# Patient Record
Sex: Female | Born: 1935 | Race: Black or African American | Hispanic: No | Marital: Single | Smoking: Never smoker
Health system: Southern US, Community
[De-identification: ages and names within clinical notes are randomized; demographics above are authoritative.]

## PROBLEM LIST (undated history)

## (undated) DIAGNOSIS — E7800 Pure hypercholesterolemia, unspecified: Secondary | ICD-10-CM

## (undated) DIAGNOSIS — A048 Other specified bacterial intestinal infections: Secondary | ICD-10-CM

## (undated) DIAGNOSIS — K2970 Gastritis, unspecified, without bleeding: Secondary | ICD-10-CM

## (undated) DIAGNOSIS — F419 Anxiety disorder, unspecified: Secondary | ICD-10-CM

## (undated) DIAGNOSIS — I10 Essential (primary) hypertension: Secondary | ICD-10-CM

## (undated) DIAGNOSIS — R630 Anorexia: Principal | ICD-10-CM

## (undated) DIAGNOSIS — I219 Acute myocardial infarction, unspecified: Secondary | ICD-10-CM

## (undated) HISTORY — DX: Other specified bacterial intestinal infections: A04.8

## (undated) HISTORY — DX: Gastritis, unspecified, without bleeding: K29.70

## (undated) HISTORY — DX: Anorexia: R63.0

---

## 1998-06-24 ENCOUNTER — Emergency Department (HOSPITAL_COMMUNITY): Admission: EM | Admit: 1998-06-24 | Discharge: 1998-06-24 | Payer: Self-pay | Admitting: Emergency Medicine

## 1999-08-17 ENCOUNTER — Emergency Department (HOSPITAL_COMMUNITY): Admission: EM | Admit: 1999-08-17 | Discharge: 1999-08-17 | Payer: Self-pay | Admitting: Emergency Medicine

## 1999-11-12 ENCOUNTER — Emergency Department (HOSPITAL_COMMUNITY): Admission: EM | Admit: 1999-11-12 | Discharge: 1999-11-12 | Payer: Self-pay

## 2001-02-18 ENCOUNTER — Emergency Department (HOSPITAL_COMMUNITY): Admission: EM | Admit: 2001-02-18 | Discharge: 2001-02-18 | Payer: Self-pay | Admitting: Emergency Medicine

## 2002-04-22 ENCOUNTER — Encounter: Payer: Self-pay | Admitting: Emergency Medicine

## 2002-04-22 ENCOUNTER — Emergency Department (HOSPITAL_COMMUNITY): Admission: EM | Admit: 2002-04-22 | Discharge: 2002-04-22 | Payer: Self-pay | Admitting: Emergency Medicine

## 2003-12-30 ENCOUNTER — Emergency Department (HOSPITAL_COMMUNITY): Admission: EM | Admit: 2003-12-30 | Discharge: 2003-12-30 | Payer: Self-pay | Admitting: Emergency Medicine

## 2005-06-20 ENCOUNTER — Emergency Department (HOSPITAL_COMMUNITY): Admission: EM | Admit: 2005-06-20 | Discharge: 2005-06-20 | Payer: Self-pay | Admitting: Emergency Medicine

## 2005-07-02 ENCOUNTER — Encounter: Admission: RE | Admit: 2005-07-02 | Discharge: 2005-07-02 | Payer: Self-pay | Admitting: Internal Medicine

## 2006-08-08 ENCOUNTER — Emergency Department (HOSPITAL_COMMUNITY): Admission: EM | Admit: 2006-08-08 | Discharge: 2006-08-08 | Payer: Self-pay | Admitting: Emergency Medicine

## 2008-03-29 ENCOUNTER — Encounter: Admission: RE | Admit: 2008-03-29 | Discharge: 2008-03-29 | Payer: Self-pay | Admitting: Internal Medicine

## 2008-04-23 ENCOUNTER — Emergency Department (HOSPITAL_COMMUNITY): Admission: EM | Admit: 2008-04-23 | Discharge: 2008-04-23 | Payer: Self-pay | Admitting: Emergency Medicine

## 2008-05-09 ENCOUNTER — Emergency Department (HOSPITAL_COMMUNITY): Admission: EM | Admit: 2008-05-09 | Discharge: 2008-05-09 | Payer: Self-pay | Admitting: Emergency Medicine

## 2009-01-04 ENCOUNTER — Inpatient Hospital Stay (HOSPITAL_COMMUNITY): Admission: EM | Admit: 2009-01-04 | Discharge: 2009-01-06 | Payer: Self-pay | Admitting: Emergency Medicine

## 2009-01-06 ENCOUNTER — Encounter: Payer: Self-pay | Admitting: Interventional Cardiology

## 2009-08-24 ENCOUNTER — Encounter: Admission: RE | Admit: 2009-08-24 | Discharge: 2009-08-24 | Payer: Self-pay | Admitting: Internal Medicine

## 2009-08-30 ENCOUNTER — Emergency Department (HOSPITAL_COMMUNITY): Admission: EM | Admit: 2009-08-30 | Discharge: 2009-08-30 | Payer: Self-pay | Admitting: Emergency Medicine

## 2009-09-27 ENCOUNTER — Encounter
Admission: RE | Admit: 2009-09-27 | Discharge: 2009-12-26 | Payer: Self-pay | Source: Home / Self Care | Admitting: Sports Medicine

## 2010-06-08 ENCOUNTER — Other Ambulatory Visit: Payer: Self-pay | Admitting: Internal Medicine

## 2010-06-08 DIAGNOSIS — Z780 Asymptomatic menopausal state: Secondary | ICD-10-CM

## 2010-06-08 DIAGNOSIS — Z1231 Encounter for screening mammogram for malignant neoplasm of breast: Secondary | ICD-10-CM

## 2010-06-08 DIAGNOSIS — Z1239 Encounter for other screening for malignant neoplasm of breast: Secondary | ICD-10-CM

## 2010-08-18 LAB — COMPREHENSIVE METABOLIC PANEL
AST: 41 U/L — ABNORMAL HIGH (ref 0–37)
Albumin: 3.7 g/dL (ref 3.5–5.2)
Alkaline Phosphatase: 45 U/L (ref 39–117)
BUN: 27 mg/dL — ABNORMAL HIGH (ref 6–23)
CO2: 29 mEq/L (ref 19–32)
Calcium: 9.4 mg/dL (ref 8.4–10.5)
Chloride: 101 mEq/L (ref 96–112)
Creatinine, Ser: 1.06 mg/dL (ref 0.4–1.2)
GFR calc Af Amer: >60 mL/min (ref >60)
GFR calc non Af Amer: 50 mL/min — ABNORMAL LOW (ref >60)
GFR calc non Af Amer: >60 mL/min (ref >60)
Glucose, Bld: 111 mg/dL — ABNORMAL HIGH (ref 70–99)

## 2010-08-18 LAB — CARDIAC PANEL(CRET KIN+CKTOT+MB+TROPI)
CK, MB: 2.3 ng/mL (ref 0.3–4.0)
CK, MB: 2.7 ng/mL (ref 0.3–4.0)
Relative Index: INVALID (ref 0.0–2.5)
Relative Index: INVALID (ref 0.0–2.5)
Total CK: 90 U/L (ref 7–177)
Total CK: 95 U/L (ref 7–177)
Total CK: 99 U/L (ref 7–177)
Troponin I: 0.02 ng/mL (ref 0.00–0.06)
Troponin I: 0.03 ng/mL (ref 0.00–0.06)

## 2010-08-18 LAB — CBC
HCT: 38.9 % (ref 36.0–46.0)
Hemoglobin: 12.9 g/dL (ref 12.0–15.0)
MCHC: 33.5 g/dL (ref 30.0–36.0)
MCV: 92.1 fL (ref 78.0–100.0)
Platelets: 205 10*3/uL (ref 150–400)
RDW: 12.6 % (ref 11.5–15.5)
WBC: 5.1 10*3/uL (ref 4.0–10.5)

## 2010-08-18 LAB — LIPID PANEL
Cholesterol: 187 mg/dL (ref 0–200)
HDL: 70 mg/dL (ref >39)
LDL Cholesterol: 107 mg/dL — ABNORMAL HIGH (ref 0–99)
Triglycerides: 48 mg/dL (ref <150)
VLDL: 10 mg/dL (ref 0–40)

## 2010-08-18 LAB — DIFFERENTIAL
Basophils Relative: 1 % (ref 0–1)
Eosinophils Relative: 1 % (ref 0–5)
Monocytes Absolute: 0.7 10*3/uL (ref 0.1–1.0)
Monocytes Relative: 14 % — ABNORMAL HIGH (ref 3–12)
Neutrophils Relative %: 52 % (ref 43–77)

## 2010-08-18 LAB — APTT: aPTT: 24 seconds (ref 24–37)

## 2010-08-18 LAB — BASIC METABOLIC PANEL
BUN: 19 mg/dL (ref 6–23)
Calcium: 8.8 mg/dL (ref 8.4–10.5)
Potassium: 3.7 mEq/L (ref 3.5–5.1)
Sodium: 138 mEq/L (ref 135–145)

## 2010-08-18 LAB — POCT CARDIAC MARKERS: Myoglobin, poc: 56.4 ng/mL (ref 12–200)

## 2010-08-27 ENCOUNTER — Other Ambulatory Visit: Payer: Self-pay

## 2010-08-27 ENCOUNTER — Ambulatory Visit: Payer: Self-pay

## 2010-09-21 ENCOUNTER — Emergency Department (HOSPITAL_COMMUNITY)
Admission: EM | Admit: 2010-09-21 | Discharge: 2010-09-22 | Discharge status: Against Medical Advice | Payer: Medicare Other | Attending: Emergency Medicine | Admitting: Emergency Medicine

## 2010-09-21 ENCOUNTER — Emergency Department (HOSPITAL_COMMUNITY): Payer: Medicare Other

## 2010-09-21 DIAGNOSIS — E785 Hyperlipidemia, unspecified: Secondary | ICD-10-CM | POA: Insufficient documentation

## 2010-09-21 DIAGNOSIS — R110 Nausea: Secondary | ICD-10-CM | POA: Insufficient documentation

## 2010-09-21 DIAGNOSIS — I2510 Atherosclerotic heart disease of native coronary artery without angina pectoris: Secondary | ICD-10-CM | POA: Insufficient documentation

## 2010-09-21 DIAGNOSIS — R079 Chest pain, unspecified: Secondary | ICD-10-CM | POA: Insufficient documentation

## 2010-09-21 DIAGNOSIS — I252 Old myocardial infarction: Secondary | ICD-10-CM | POA: Insufficient documentation

## 2010-09-21 DIAGNOSIS — E876 Hypokalemia: Secondary | ICD-10-CM | POA: Insufficient documentation

## 2010-09-21 DIAGNOSIS — I10 Essential (primary) hypertension: Secondary | ICD-10-CM | POA: Insufficient documentation

## 2010-09-22 ENCOUNTER — Emergency Department (HOSPITAL_COMMUNITY): Payer: Medicare Other

## 2010-09-22 LAB — COMPREHENSIVE METABOLIC PANEL
ALT: 18 U/L (ref 0–35)
AST: 27 U/L (ref 0–37)
CO2: 28 mEq/L (ref 19–32)
Chloride: 86 mEq/L — ABNORMAL LOW (ref 96–112)
Creatinine, Ser: 0.59 mg/dL (ref 0.4–1.2)
GFR calc Af Amer: >60 mL/min (ref >60)
GFR calc non Af Amer: >60 mL/min (ref >60)
Glucose, Bld: 140 mg/dL — ABNORMAL HIGH (ref 70–99)
Total Bilirubin: 0.5 mg/dL (ref 0.3–1.2)

## 2010-09-22 LAB — URINALYSIS, ROUTINE W REFLEX MICROSCOPIC
Nitrite: NEGATIVE
Protein, ur: NEGATIVE mg/dL
Specific Gravity, Urine: 1.009 (ref 1.005–1.030)
Urobilinogen, UA: 0.2 mg/dL (ref 0.0–1.0)

## 2010-09-22 LAB — POCT CARDIAC MARKERS

## 2010-09-22 LAB — URINE MICROSCOPIC-ADD ON

## 2010-09-22 LAB — POCT I-STAT, CHEM 8
Hemoglobin: 13.9 g/dL (ref 12.0–15.0)
Potassium: 2.9 mEq/L — ABNORMAL LOW (ref 3.5–5.1)
Sodium: 125 mEq/L — ABNORMAL LOW (ref 135–145)
TCO2: 27 mmol/L (ref 0–100)

## 2010-09-22 LAB — DIFFERENTIAL
Basophils Relative: 0 % (ref 0–1)
Eosinophils Absolute: 0 10*3/uL (ref 0.0–0.7)
Eosinophils Relative: 0 % (ref 0–5)
Monocytes Absolute: 0.3 10*3/uL (ref 0.1–1.0)
Monocytes Relative: 9 % (ref 3–12)
Neutro Abs: 1.9 10*3/uL (ref 1.7–7.7)

## 2010-09-22 LAB — PROTIME-INR: INR: 0.88 (ref 0.00–1.49)

## 2010-09-22 LAB — CBC
Hemoglobin: 12.1 g/dL (ref 12.0–15.0)
MCH: 29.4 pg (ref 26.0–34.0)
MCHC: 33.8 g/dL (ref 30.0–36.0)
RDW: 12.1 % (ref 11.5–15.5)

## 2010-09-25 NOTE — Discharge Summary (Signed)
NAME:  Karen Arias, Karen Arias            ACCOUNT NO.:  [id number]   MEDICAL RECORD NO.:  [id number]          PATIENT TYPE:  INP   LOCATION:  1420                         FACILITY:  [facility]   PHYSICIAN:  Herbie Saxon, MDDATE OF BIRTH:  [birth year]   DATE OF ADMISSION:  01/04/2009  DATE OF DISCHARGE:  01/06/2009                               DISCHARGE SUMMARY   DISCHARGE DIAGNOSES:  1. Chest pain, acute coronary syndrome ruled out.  2. Hypokalemia, supplemented.  3. Mild acute renal insufficiency, improved.  4. Hyperlipidemia.  5. Hypertension, stable.  6. Mild dyslipidemia.   CONSULTS:  Lyn Records, M.D., Cardiology.   DIAGNOSTICS:  1. The patient had nuclear stress test which showed ejection fraction      of 33%, anteroapical scar, no significant ischemia.  2. Chest x-ray of January 04, 2009 showed stable cardiomegaly and      lingular atelectasis.  No acute chest findings.   HOSPITAL COURSE:  This 77 year old African American lady presented to  the emergency room with complaint of substernal chest discomfort  radiating to both shoulders.  At presentation, the patient was slightly  hyperkalemic and dehydrated.  She was started on gentle IV fluid  hydration and potassium was supplemented.  Serial cardiac enzymes were  followed and this has been normal.  The patient also had mild  hyponatremia which improved with the IV fluid hydration.  The patient  has been ruled out with a negative nuclear stress test today.  She has  been cleared for discharge home by the Cardiologist.   CONDITION ON DISCHARGE:  Stable.   DIET:  Diet will be a 2 gm sodium, heart healthy, low cholesterol.   ACTIVITY:  Increase slowly as tolerated.   FOLLOW UP:  1. She will follow up with primary care physician, Dr. Su Hilt in the      next 5-7 days.  2. Follow up with Dr. Katrinka Blazing, Cardiology, in the next 7-10 days.   DISCHARGE MEDICATIONS:  1. Nitroglycerin 0.4 mg sublingual p.r.n.  2. Enteric-coated  aspirin 325 mg daily.  3. Coreg 3.125 mg twice daily.  4. Lipitor 80 mg daily.  5. Xanax 0.5 mg t.i.d. as needed.  6. Maxzide 25/37.5 one tablet daily.   PHYSICAL EXAMINATION:  GENERAL:  Today, she is an elderly lady not in  acute distress.  VITAL SIGNS:  Stable.  Temperature 98, pulse 70, respiratory rate 18,  blood pressure 150/80.  HEENT:  Pupils are equal and reactive to light and accommodation.  She  is not pale or jaundiced.  Head is atraumatic, normocephalic.  Mucous  membranes are moist.  Oropharynx and nasopharynx are clear.  NECK:  Supple.  No elevated JVD.  No thyromegaly or lymphadenopathy.  No  carotid bruit.  CHEST:  Clinically clear.  HEART:  Sounds 1 and 2, regular rate and rhythm.  ABDOMEN:  Benign.  CNS:  No focal deficits.  EXTREMITIES:  Peripheral pulses are present.  No pedal edema.  No  cyanosis or clubbing.  SKIN:  No new skin rash.   LABORATORY DATA:  Thyroid function test is normal.  Chemistry:  Sodium  is 138, potassium 3.7, chloride 103, bicarbonate 30, glucose 112, BUN 9,  creatinine 0.8, WBC 5, hematocrit 39, platelet count 189.   Discharge time greater than 30 minutes.      Herbie Saxon, MD  Electronically Signed     MIO/MEDQ  D:  01/06/2009  T:  01/06/2009  Job:  [id number]

## 2010-09-25 NOTE — H&P (Signed)
NAME:  Karen Arias, Karen Arias NO.:  [id number]   MEDICAL RECORD NO.:  [id number]          PATIENT TYPE:  EMS   LOCATION:  ED                           FACILITY:  [facility]   PHYSICIAN:  Massie Maroon, MD        DATE OF BIRTH:  [birth year]   DATE OF ADMISSION:  01/04/2009  DATE OF DISCHARGE:                              HISTORY & PHYSICAL   PRIMARY CARE PHYSICIAN:  Antony Madura, M.D.   CHIEF COMPLAINT:  Chest pain.   HISTORY OF PRESENT ILLNESS:  A 77 year old female with a history of CAD,  status post MI in 1981, hypertension, hyperlipidemia, complains of chest  pain that began about 2 p.m. this afternoon.  She describes the pain as  achy and substernal.  It radiated to her bilateral shoulders.  It was  constant and she took some Tylenol and then it went away.  The patient  denies any fever, chills, cough, palpitations, nausea, vomiting, acid  reflux, heartburn, abdominal pain, diarrhea, bright red blood per  rectum, or black stool.  Emergency room records state that chest  discomfort started several days ago but today started 2 p.m.Marland Kitchen  She could  not describe anything that made it better or worse.  She was concerned  about her heart so she came to the ED for evaluation.  Her chest x-ray  was read as negative for any acute process.  Her  EKG showed normal  sinus rhythm at 67, normal PR interval, prolonged QTC of 460, borderline  left axis deviation, right bundle branch block, Q-wave in V1-V2, T-wave  inversion and I, aVL, V3, V5, V6.  No ST-T segment changes consistent  with acute ischemia.  There was no prior EKG for comparison.  The  patient was noted to be slightly hypokalemic and dehydrated in the ED.  Her potassium was 3.4, BUN was 32.  The patient received O2 nasal  cannula in the ED.  The patient will be admitted for workup of chest  pain.   PAST MEDICAL HISTORY:  1. Hypertension.  2. Hyperlipidemia.  3. Coronary artery disease, status post MI in 1981.  4. Blood  clot in the legs in the remote past.   PAST SURGICAL HISTORY:  1. Cataract surgery.  2. Tubal ligation.  3. C-section  4. Appendectomy.  5. Colonoscopy 3 years ago, negative per the patient.   SOCIAL HISTORY:  The patient lives at home and is single.  She has four  children.  She has never smoked and does not drink.  There is no history  of drug abuse as far as we know.   FAMILY HISTORY:  Mother died at age 35 from liver problems and alcohol  abuse.  Her father died at age 41 from heart attack.  He was a diabetic  and had some form of sinus cancer.  She has no siblings.  Her  grandmother also had heart problems.   REVIEW OF SYSTEMS:  Negative for all 10 organ systems except for  pertinent positives stated above.   ALLERGIES:  NO KNOWN DRUG ALLERGIES.   MEDICATIONS:  Aspirin,  Lipitor, Maxzide, Xanax as per ED record.   PHYSICAL EXAMINATION:  VITAL SIGNS:  Temperature 97.9, pulse 71,  respirations 18, blood pressure 143/77, pulse ox 90% on room air.  HEENT: Anicteric, EOMI, no nystagmus, pupils 1.5 mm, symmetric.  Mucous  membranes moist.  NECK:  No JVD, no bruit, no thyromegaly, no adenopathy.  HEART:  Regular rate and rhythm.  S1 and S2, no murmurs, gallops or  rubs.  LUNGS:  Clear to auscultation bilaterally.  ABDOMEN:  Abdomen is soft, obese, nontender, nondistended.  Positive  bowel sounds.  EXTREMITIES:  No cyanosis, clubbing or edema.  DP pulses 2+ bilaterally.  SKIN:  No rashes.  LYMPH NODES:  No adenopathy.  NEUROLOGIC:  Nonfocal.   LABORATORY DATA:  Sodium 33 (low),  potassium 3.4 (low), chloride 98,  bicarb 29, BUN 32 (high), creatinine 1.06, AST 41 (high), , ALT 35, alk  phos 45, total bilirubin 0.8, PTT 24, CPK-MB 1.3, troponin-I less than  0.05.  WBC 5.3, hemoglobin 12.9, platelet count 205.  EKG:  See reading  in above HPI.  Chest x-ray negative for any acute process.   ASSESSMENT/PLAN:  1. Chest pain:  The patient placed on telemetry.  We will check       troponin-I q.8 h x3 sets.  The patient will be continued on      aspirin, Lipitor.  We will start on carvedilol 3.125 mg p.o. b.i.d.      Chest pain sounds somewhat atypical but with her history of      coronary disease, we will obtain a nuclear stress test after she is      ruled out.  2. Hypokalemia:  We will proceeded with potassium chloride 20 mEq p.o.      times one.  3. Hyponatremia:  Very mild. I do not believe that this needs further      workup at this time.  We will hydrate her gently with normal saline      due to the fact that she appears dehydrated with a BUN and      creatinine ratio of greater than 20.  4. Abnormal liver function tests:  I believe that this can be followed      up as an outpatient by her primary care doctor.  We will order      right upper quadrant ultrasound, however, and we will check acute      hepatitis panel, but further workup such as iron studies, serulo      plasma, alpha one antitrypsin, ANA, anti-smooth muscle antibodies,      and celiac panel can be performed as an outpatient.  Most likely      her abnormal liver function test is due to be a fatty liver or the      fact that she is on a statin medication.  5. Deep venous thrombosis prophylaxis:  Lovenox 40 mg subcu daily.      Massie Maroon, MD  Electronically Signed     JYK/MEDQ  D:  01/04/2009  T:  01/05/2009  Job:  [id number]   cc:   Antony Madura, M.D.  Fax: [phone]

## 2010-09-25 NOTE — Consult Note (Signed)
NAME:  Karen Arias, Karen Arias            ACCOUNT NO.:  [id number]   MEDICAL RECORD NO.:  [id number]          PATIENT TYPE:  INP   LOCATION:  1420                         FACILITY:  [facility]   PHYSICIAN:  Lyn Records, M.D.   DATE OF BIRTH:  Mar 23, 1929   DATE OF CONSULTATION:  DATE OF DISCHARGE:                                 CONSULTATION   CONCLUSIONS:  1. Chest and arm discomfort of uncertain cause lasting less than 1      hour.  Negative cardiac markers to this point.  2. Abnormal EKG with bifascicular block (left anterior fascicular      block and right bundle branch block).  3. History of heart attack in 1981.      a.     Temporary pacemaker for 48 hours during a 27-day       hospitalization.  4. Hypertension.  5. Hyperlipidemia.  6. Hypokalemia.   RECOMMENDATIONS:  1. Stress nuclear study on January 06, 2009, at 8 a.m.  2. NPO after midnight.  3. Ambulate today.  4. Serial ECGs and markers.   COMMENTS:  The patient is an 77 year old patient followed by Dr. Burton Apley.  She gives a history of a 1 month hospitalization in 1981  related to a heart attack.  She had temporary pacemaker inserted for  about 48 hours.  Subsequently, she has done well.  She did not have  angioplasty or any type of revascularization procedure at that time.  [facility] [employer]  She denies orthopnea  and PND.  There are no physical limitations.   On the day of admission, she was getting ready to go to church in the  evening and noticed an achy feeling throughout her chest, in her  abdomen, and into her arms.  It persisted and it caused her to come to  the emergency room.  She initially took some Tylenol for the discomfort,  but it did not relieve the discomfort after about 20 minutes.  By the  time she arrived in the emergency room, she felt well.  She was admitted  to the hospital to rule out myocardial infarction.   SIGNIFICANT MEDICAL PROBLEMS:  1.  Hypertension.  2. Hyperlipidemia.  3. History of DVT  4. History of appendectomy.   ALLERGIES:  No known drug allergies.   MEDICATIONS ON ADMISSION:  Lipitor unknown dose, Maxzide unknown dose,  Xanax p.r.n., coated aspirin 1 per day.   FAMILY HISTORY:  Mother died at age 39 of liver failure due to alcohol  abuse.  Father died at age 64 of heart trouble and diabetes and cancer.  No siblings.   PAST SURGICAL HISTORY:  1. C-section  2. Tubal ligation.  3. Cataract surgery.  4. Appendectomy.   PHYSICAL EXAMINATION:  GENERAL:  On exam, the patient is in no acute  distress.  VITAL SIGNS:  Her blood pressure is 124/60, the heart rate is 55.  O2  saturation is 98%.  NECK:  The neck veins are not distended.  There no carotid bruits.  LUNGS:  Clear.  CARDIAC:  Exam  reveals no gallop.  ABDOMEN:  Soft.  No bruits are heard.  EXTREMITIES:  Reveal no edema.  Pedal pulses were not assessed.  NEUROLOGICAL:  Exam is unremarkable.   EKG is abnormal demonstrating a right bundle, biatrial abnormality, left  anterior hemiblock.  No prior tracing available for comparison.  Hemoglobin is 12.9 on admission, white blood cell count 5,300, BUN 27,  creatinine 0.85, alk phos is low at 35, SGOT 40, and SGPT is 38 both  minimally elevated.  Cholesterol LDL is 107.   DISCUSSION:  We do not have details of the patient's prior history of  myocardial infarction.  She states that there was a massive heart  attack.  However, her clinical status over the past 29 years has been  remarkably stable making it somewhat doubtful that this was a  massive/large MI.  Given that history, however, at the very least the  patient will need to have a stress nuclear study performed.  Unfortunately, there are no doses left today (8 a.m. on January 05, 2009)  and the study will need to be performed tomorrow morning.      Lyn Records, M.D.  Electronically Signed     HWS/MEDQ  D:  01/05/2009  T:  01/05/2009  Job:   [id number]   cc:   Antony Madura, M.D.  Fax: [phone]

## 2011-12-17 ENCOUNTER — Emergency Department (HOSPITAL_COMMUNITY)
Admission: EM | Admit: 2011-12-17 | Discharge: 2011-12-17 | Disposition: A | Discharge status: Home / Self Care | Payer: Medicare Other | Attending: Emergency Medicine | Admitting: Emergency Medicine

## 2011-12-17 ENCOUNTER — Emergency Department (HOSPITAL_COMMUNITY): Payer: Medicare Other

## 2011-12-17 ENCOUNTER — Encounter (HOSPITAL_COMMUNITY): Payer: Self-pay | Admitting: *Deleted

## 2011-12-17 DIAGNOSIS — F411 Generalized anxiety disorder: Secondary | ICD-10-CM | POA: Insufficient documentation

## 2011-12-17 DIAGNOSIS — E119 Type 2 diabetes mellitus without complications: Secondary | ICD-10-CM | POA: Insufficient documentation

## 2011-12-17 DIAGNOSIS — I10 Essential (primary) hypertension: Secondary | ICD-10-CM | POA: Insufficient documentation

## 2011-12-17 DIAGNOSIS — E7800 Pure hypercholesterolemia, unspecified: Secondary | ICD-10-CM | POA: Insufficient documentation

## 2011-12-17 DIAGNOSIS — R110 Nausea: Secondary | ICD-10-CM | POA: Insufficient documentation

## 2011-12-17 DIAGNOSIS — R109 Unspecified abdominal pain: Secondary | ICD-10-CM | POA: Insufficient documentation

## 2011-12-17 DIAGNOSIS — Z79899 Other long term (current) drug therapy: Secondary | ICD-10-CM | POA: Insufficient documentation

## 2011-12-17 HISTORY — DX: Anxiety disorder, unspecified: F41.9

## 2011-12-17 HISTORY — DX: Essential (primary) hypertension: I10

## 2011-12-17 HISTORY — DX: Pure hypercholesterolemia, unspecified: E78.00

## 2011-12-17 LAB — URINE MICROSCOPIC-ADD ON

## 2011-12-17 LAB — COMPREHENSIVE METABOLIC PANEL
ALT: 16 U/L (ref 0–35)
Alkaline Phosphatase: 45 U/L (ref 39–117)
CO2: 33 mEq/L — ABNORMAL HIGH (ref 19–32)
GFR calc Af Amer: 88 mL/min — ABNORMAL LOW (ref >90)
Glucose, Bld: 114 mg/dL — ABNORMAL HIGH (ref 70–99)
Potassium: 3.3 mEq/L — ABNORMAL LOW (ref 3.5–5.1)
Sodium: 129 mEq/L — ABNORMAL LOW (ref 135–145)
Total Protein: 7 g/dL (ref 6.0–8.3)

## 2011-12-17 LAB — URINALYSIS, ROUTINE W REFLEX MICROSCOPIC
Bilirubin Urine: NEGATIVE
Hgb urine dipstick: NEGATIVE
Specific Gravity, Urine: 1.014 (ref 1.005–1.030)
pH: 7.5 (ref 5.0–8.0)

## 2011-12-17 LAB — CBC WITH DIFFERENTIAL/PLATELET
Eosinophils Absolute: 0 10*3/uL (ref 0.0–0.7)
Lymphocytes Relative: 41 % (ref 12–46)
Lymphs Abs: 1.5 10*3/uL (ref 0.7–4.0)
Neutrophils Relative %: 46 % (ref 43–77)
Platelets: 215 10*3/uL (ref 150–400)
RBC: 4.09 MIL/uL (ref 3.87–5.11)
WBC: 3.7 10*3/uL — ABNORMAL LOW (ref 4.0–10.5)

## 2011-12-17 MED ORDER — ONDANSETRON HCL 4 MG PO TABS: 4.0000 mg | ORAL_TABLET | Freq: Four times a day (QID) | ORAL | Status: AC

## 2011-12-17 MED ORDER — DICYCLOMINE HCL 20 MG PO TABS: 20.0000 mg | ORAL_TABLET | Freq: Two times a day (BID) | ORAL | Status: DC

## 2011-12-17 NOTE — ED Notes (Signed)
Pt states "I been having abdominal pain for a couple of weeks, feels kind of like indigestion, the smell of food makes me sick, my legs have also been hurting, I go to the gym everyday"

## 2011-12-17 NOTE — ED Provider Notes (Addendum)
History     CSN: [id number]  Arrival date & time 12/17/11  1052   First MD Initiated Contact with Patient 12/17/11 1156      Chief Complaint  Patient presents with  . Abdominal Pain  . Nausea    (Consider location/radiation/quality/duration/timing/severity/associated sxs/prior treatment) HPI Pt has had upper abd fullness and anorexia for several weeks. No fever chills, V/D/C, urinary symptoms.  Fullness is not associated with food. She has tried taking tums with little effect. No pain currently.  Past Medical History  Diagnosis Date  . Hypertension   . Diabetes mellitus   . Hypercholesteremia   . Anxiety     Past Surgical History  Procedure Date  . Cesarean section     No family history on file.  History  Substance Use Topics  . Smoking status: Never Smoker   . Smokeless tobacco: Not on file  . Alcohol Use: No    OB History    Grav Para Term Preterm Abortions TAB SAB Ect Mult Living                  Review of Systems  Constitutional: Positive for appetite change. Negative for fever and chills.  HENT: Negative for neck pain and neck stiffness.   Respiratory: Negative for shortness of breath.   Cardiovascular: Negative for chest pain and leg swelling.  Gastrointestinal: Positive for nausea, abdominal pain and abdominal distention. Negative for vomiting and diarrhea.  Genitourinary: Negative for dysuria and frequency.  Musculoskeletal: Positive for myalgias. Negative for back pain.  Skin: Negative for pallor, rash and wound.  Neurological: Negative for dizziness, weakness, light-headedness and numbness.    Allergies  Review of patient's allergies indicates no known allergies.  Home Medications   Current Outpatient Rx  Name Route Sig Dispense Refill  . ALPRAZOLAM 0.5 MG PO TABS Oral Take 0.5 mg by mouth daily as needed. anxiety    . ATORVASTATIN CALCIUM 80 MG PO TABS Oral Take 80 mg by mouth daily.    . OS-CAL 500 + D PO Oral Take 1 tablet by mouth daily.     Marland Kitchen METFORMIN HCL 500 MG PO TABS Oral Take 500 mg by mouth daily with breakfast.    . TRIAMTERENE-HCTZ 75-50 MG PO TABS Oral Take 0.5 tablets by mouth daily.    Marland Kitchen DICYCLOMINE HCL 20 MG PO TABS Oral Take 1 tablet (20 mg total) by mouth 2 (two) times daily. 20 tablet 0  . ONDANSETRON HCL 4 MG PO TABS Oral Take 1 tablet (4 mg total) by mouth every 6 (six) hours. 12 tablet 0    BP 139/64  Pulse 98  Temp 98.5 F (36.9 C)  Resp 14  SpO2 99%  Physical Exam  Nursing note and vitals reviewed. Constitutional: She is oriented to person, place, and time. She appears well-developed and well-nourished. No distress.  HENT:  Head: Normocephalic and atraumatic.  Mouth/Throat: Oropharynx is clear and moist.  Eyes: EOM are normal. Pupils are equal, round, and reactive to light.  Neck: Normal range of motion. Neck supple.  Cardiovascular: Normal rate and regular rhythm.   Pulmonary/Chest: Effort normal and breath sounds normal. No respiratory distress. She has no wheezes. She has no rales.  Abdominal: Soft. Bowel sounds are normal. She exhibits no distension and no mass. There is no tenderness. There is no rebound and no guarding.  Musculoskeletal: Normal range of motion. She exhibits no edema and no tenderness.       Mild lower ext muscular tenderness,  Neurovasc intact.   Neurological: She is alert and oriented to person, place, and time.       5/5 motor, sensation intact  Skin: Skin is warm and dry. No rash noted. No erythema.  Psychiatric: She has a normal mood and affect. Her behavior is normal.    ED Course  Procedures (including critical care time)  Labs Reviewed  URINALYSIS, ROUTINE W REFLEX MICROSCOPIC - Abnormal; Notable for the following:    Leukocytes, UA SMALL (*)     All other components within normal limits  URINE MICROSCOPIC-ADD ON - Abnormal; Notable for the following:    Squamous Epithelial / LPF FEW (*)     Bacteria, UA FEW (*)     All other components within normal limits    CBC WITH DIFFERENTIAL - Abnormal; Notable for the following:    WBC 3.7 (*)     Monocytes Relative 13 (*)     All other components within normal limits  COMPREHENSIVE METABOLIC PANEL - Abnormal; Notable for the following:    Sodium 129 (*)     Potassium 3.3 (*)     Chloride 90 (*)     CO2 33 (*)     Glucose, Bld 114 (*)     GFR calc non Af Amer 76 (*)     GFR calc Af Amer 88 (*)     All other components within normal limits  LIPASE, BLOOD   Dg Abd Acute W/chest  12/17/2011  *RADIOLOGY REPORT*  Clinical Data: Upper abdominal pain.  ACUTE ABDOMEN SERIES (ABDOMEN 2 VIEW & CHEST 1 VIEW)  Comparison: Chest x-ray dated 09/22/2010  Findings: There is chronic cardiomegaly.  Lungs are clear of acute infiltrates.  The bowel gas pattern is normal.  No free air or free fluid.  No significant osseous abnormality.  IMPRESSION: Benign-appearing abdomen.  Chronic cardiomegaly.  Original Report Authenticated By: Gwynn Burly, M.D.     1. Abdominal pain of unknown etiology      Date: 02/19/2012  Rate: 54  Rhythm: sinus bradycardia  QRS Axis: normal  Intervals: QRS prolonged  ST/T Wave abnormalities: nonspecific T wave changes  Conduction Disutrbances:right bundle branch block  Narrative Interpretation:   Old EKG Reviewed: none available    MDM  Pt resting comfortably. Abd remains soft and non-tender. Pt advised to eat more potassium-rich foods. Return for worsening pain, fever, chills, or any concerns        Loren Racer, MD 12/17/11 1546  Loren Racer, MD 02/19/12 [phone]

## 2012-01-31 ENCOUNTER — Other Ambulatory Visit: Payer: Self-pay | Admitting: Internal Medicine

## 2012-01-31 DIAGNOSIS — Z1231 Encounter for screening mammogram for malignant neoplasm of breast: Secondary | ICD-10-CM

## 2012-02-04 ENCOUNTER — Ambulatory Visit
Admission: RE | Admit: 2012-02-04 | Discharge: 2012-02-04 | Disposition: A | Discharge status: Home / Self Care | Payer: Medicare Other | Source: Ambulatory Visit | Attending: Internal Medicine | Admitting: Internal Medicine

## 2012-02-04 DIAGNOSIS — Z1231 Encounter for screening mammogram for malignant neoplasm of breast: Secondary | ICD-10-CM

## 2012-08-20 ENCOUNTER — Emergency Department (HOSPITAL_COMMUNITY): Payer: Medicare PPO

## 2012-08-20 ENCOUNTER — Encounter (HOSPITAL_COMMUNITY): Payer: Self-pay

## 2012-08-20 ENCOUNTER — Emergency Department (HOSPITAL_COMMUNITY)
Admission: EM | Admit: 2012-08-20 | Discharge: 2012-08-20 | Disposition: A | Discharge status: Home / Self Care | Payer: Medicare PPO | Attending: Emergency Medicine | Admitting: Emergency Medicine

## 2012-08-20 ENCOUNTER — Encounter: Payer: Self-pay | Admitting: Internal Medicine

## 2012-08-20 DIAGNOSIS — I252 Old myocardial infarction: Secondary | ICD-10-CM | POA: Insufficient documentation

## 2012-08-20 DIAGNOSIS — Z79899 Other long term (current) drug therapy: Secondary | ICD-10-CM | POA: Insufficient documentation

## 2012-08-20 DIAGNOSIS — K3189 Other diseases of stomach and duodenum: Secondary | ICD-10-CM

## 2012-08-20 DIAGNOSIS — R1013 Epigastric pain: Secondary | ICD-10-CM | POA: Insufficient documentation

## 2012-08-20 DIAGNOSIS — F411 Generalized anxiety disorder: Secondary | ICD-10-CM | POA: Insufficient documentation

## 2012-08-20 DIAGNOSIS — R110 Nausea: Secondary | ICD-10-CM | POA: Insufficient documentation

## 2012-08-20 DIAGNOSIS — Z9889 Other specified postprocedural states: Secondary | ICD-10-CM | POA: Insufficient documentation

## 2012-08-20 DIAGNOSIS — E119 Type 2 diabetes mellitus without complications: Secondary | ICD-10-CM | POA: Insufficient documentation

## 2012-08-20 DIAGNOSIS — R197 Diarrhea, unspecified: Secondary | ICD-10-CM | POA: Insufficient documentation

## 2012-08-20 DIAGNOSIS — I10 Essential (primary) hypertension: Secondary | ICD-10-CM | POA: Insufficient documentation

## 2012-08-20 DIAGNOSIS — E7800 Pure hypercholesterolemia, unspecified: Secondary | ICD-10-CM | POA: Insufficient documentation

## 2012-08-20 DIAGNOSIS — R634 Abnormal weight loss: Secondary | ICD-10-CM | POA: Insufficient documentation

## 2012-08-20 DIAGNOSIS — R933 Abnormal findings on diagnostic imaging of other parts of digestive tract: Secondary | ICD-10-CM | POA: Insufficient documentation

## 2012-08-20 DIAGNOSIS — R630 Anorexia: Secondary | ICD-10-CM | POA: Insufficient documentation

## 2012-08-20 HISTORY — DX: Acute myocardial infarction, unspecified: I21.9

## 2012-08-20 LAB — COMPREHENSIVE METABOLIC PANEL
ALT: 18 U/L (ref 0–35)
Albumin: 3.7 g/dL (ref 3.5–5.2)
Alkaline Phosphatase: 45 U/L (ref 39–117)
BUN: 15 mg/dL (ref 6–23)
Chloride: 89 mEq/L — ABNORMAL LOW (ref 96–112)
Glucose, Bld: 110 mg/dL — ABNORMAL HIGH (ref 70–99)
Potassium: 4 mEq/L (ref 3.5–5.1)
Sodium: 127 mEq/L — ABNORMAL LOW (ref 135–145)
Total Bilirubin: 0.3 mg/dL (ref 0.3–1.2)
Total Protein: 7 g/dL (ref 6.0–8.3)

## 2012-08-20 LAB — URINALYSIS, ROUTINE W REFLEX MICROSCOPIC
Bilirubin Urine: NEGATIVE
Glucose, UA: NEGATIVE mg/dL
Hgb urine dipstick: NEGATIVE
Ketones, ur: NEGATIVE mg/dL
Nitrite: NEGATIVE
Specific Gravity, Urine: 1.012 (ref 1.005–1.030)
pH: 7.5 (ref 5.0–8.0)

## 2012-08-20 LAB — CBC WITH DIFFERENTIAL/PLATELET
Basophils Relative: 0 % (ref 0–1)
Hemoglobin: 11.8 g/dL — ABNORMAL LOW (ref 12.0–15.0)
Lymphs Abs: 1.3 10*3/uL (ref 0.7–4.0)
Monocytes Relative: 13 % — ABNORMAL HIGH (ref 3–12)
Neutro Abs: 2.4 10*3/uL (ref 1.7–7.7)
Neutrophils Relative %: 56 % (ref 43–77)
Platelets: 242 10*3/uL (ref 150–400)
RBC: 4 MIL/uL (ref 3.87–5.11)

## 2012-08-20 LAB — URINE MICROSCOPIC-ADD ON

## 2012-08-20 LAB — LIPASE, BLOOD: Lipase: 35 U/L (ref 11–59)

## 2012-08-20 MED ORDER — IOHEXOL 300 MG/ML  SOLN
100.0000 mL | Freq: Once | INTRAMUSCULAR | Status: AC | PRN
  Administered 2012-08-20: 100 mL via INTRAVENOUS

## 2012-08-20 MED ORDER — IOHEXOL 300 MG/ML  SOLN
50.0000 mL | Freq: Once | INTRAMUSCULAR | Status: AC | PRN
  Administered 2012-08-20: 50 mL via ORAL

## 2012-08-20 NOTE — ED Provider Notes (Addendum)
History     CSN: [id number]  Arrival date & time 08/20/12  0903   First MD Initiated Contact with Patient 08/20/12 [id number]      Chief Complaint  Patient presents with  . Abdominal Pain    (Consider location/radiation/quality/duration/timing/severity/associated sxs/prior treatment) Patient is a 79 y.o. female presenting with abdominal pain. The history is provided by the patient.  Abdominal Pain Pain location:  Epigastric Pain quality: bloating, fullness and pressure   Pain radiation: Occasionally the pain radiates out from the epigastrium into the right upper and left upper quadrant. Pain severity:  Mild Onset quality:  Gradual Duration: 7 months. Timing:  Intermittent Progression:  Waxing and waning (But has worsened over the last one week) Chronicity:  Chronic Context: awakening from sleep, diet changes and eating   Context: not alcohol use   Relieved by:  Nothing (Mild improvement with GERD medication) Worsened by:  Eating Associated symptoms: anorexia, diarrhea and nausea   Associated symptoms: no chest pain, no chills, no constipation, no cough, no dysuria, no fever, no shortness of breath and no vomiting   Associated symptoms comment:  Early satiety Risk factors comment:  Has seen her PCP this month and given GERD meds.  Seen in the ER last year with normal KUB    Past Medical History  Diagnosis Date  . Hypertension   . Diabetes mellitus   . Hypercholesteremia   . Anxiety   . MI (myocardial infarction)     Past Surgical History  Procedure Laterality Date  . Cesarean section      Family History  Problem Relation Age of Onset  . Diabetes Father   . Hypertension Father   . Heart failure Father     History  Substance Use Topics  . Smoking status: Never Smoker   . Smokeless tobacco: Never Used  . Alcohol Use: No    OB History   Grav Para Term Preterm Abortions TAB SAB Ect Mult Living                  Review of Systems  Constitutional: Positive for  appetite change and unexpected weight change. Negative for fever and chills.       Lost 5-10lbs over the last few months  Respiratory: Negative for cough and shortness of breath.   Cardiovascular: Negative for chest pain.  Gastrointestinal: Positive for nausea, abdominal pain, diarrhea and anorexia. Negative for vomiting and constipation.  Genitourinary: Negative for dysuria.  All other systems reviewed and are negative.    Allergies  Review of patient's allergies indicates no known allergies.  Home Medications   Current Outpatient Rx  Name  Route  Sig  Dispense  Refill  . ALPRAZolam (XANAX) 0.5 MG tablet   Oral   Take 0.5 mg by mouth daily as needed. anxiety         . atorvastatin (LIPITOR) 80 MG tablet   Oral   Take 80 mg by mouth daily.         . Calcium Carbonate-Vitamin D (OS-CAL 500 + D PO)   Oral   Take 1 tablet by mouth daily.         . metFORMIN (GLUCOPHAGE) 500 MG tablet   Oral   Take 500 mg by mouth daily with breakfast.         . triamterene-hydrochlorothiazide (MAXZIDE) 75-50 MG per tablet   Oral   Take 0.5 tablets by mouth daily.           BP 144/69  Pulse 69  Temp(Src) 97.6 F (36.4 C) (Oral)  Resp 16  SpO2 99%  Physical Exam  Nursing note and vitals reviewed. Constitutional: She is oriented to person, place, and time. She appears well-developed and well-nourished. No distress.  HENT:  Head: Normocephalic and atraumatic.  Mouth/Throat: Oropharynx is clear and moist.  Eyes: Conjunctivae and EOM are normal. Pupils are equal, round, and reactive to light.  Neck: Normal range of motion. Neck supple.  Cardiovascular: Normal rate, regular rhythm and intact distal pulses.   No murmur heard. Pulmonary/Chest: Effort normal and breath sounds normal. No respiratory distress. She has no wheezes. She has no rales.  Abdominal: Soft. She exhibits no distension. There is no tenderness. There is no rebound and no guarding.  Musculoskeletal: Normal  range of motion. She exhibits no edema and no tenderness.  Neurological: She is alert and oriented to person, place, and time.  Skin: Skin is warm and dry. No rash noted. No erythema.  Psychiatric: She has a normal mood and affect. Her behavior is normal.    ED Course  Procedures (including critical care time)  Labs Reviewed  CBC WITH DIFFERENTIAL - Abnormal; Notable for the following:    Hemoglobin 11.8 (*)    HCT 35.1 (*)    Monocytes Relative 13 (*)    All other components within normal limits  COMPREHENSIVE METABOLIC PANEL - Abnormal; Notable for the following:    Sodium 127 (*)    Chloride 89 (*)    Glucose, Bld 110 (*)    GFR calc non Af Amer 75 (*)    GFR calc Af Amer 87 (*)    All other components within normal limits  URINALYSIS, ROUTINE W REFLEX MICROSCOPIC - Abnormal; Notable for the following:    Leukocytes, UA SMALL (*)    All other components within normal limits  LIPASE, BLOOD  URINE MICROSCOPIC-ADD ON   Ct Abdomen Pelvis W Contrast  08/20/2012  *RADIOLOGY REPORT*  Clinical Data: Early satiety and upper abdominal pain.  Diarrhea. Gastroesophageal reflux disease.  CT ABDOMEN AND PELVIS WITH CONTRAST  Technique:  Multidetector CT imaging of the abdomen and pelvis was performed following the standard protocol during bolus administration of intravenous contrast.  Contrast: 50mL OMNIPAQUE IOHEXOL 300 MG/ML  SOLN, OMNIPAQUE IOHEXOL 300 MG/ML  SOLN  Comparison: Plain film 12/17/2011.  No prior CT.  Findings: Lung bases:  Bibasilar scarring.  Moderate cardiomegaly. A prior left apical infarct with thinning and calcification, including on image 6/series 2.  Right coronary artery disease.  Abdomen/pelvis:  Old granulomatous disease within the liver. Normal spleen. The gastric body is underdistended.  Apparent wall thickening involving the greater curvature could be secondary. Example at 1.8 cm on coronal image 28 and transverse image 21/series 2.  Normal pancreas, gallbladder,  biliary tract.  Increased density in the region of the pancreatic head on image 24 is favored to represent contrast reflux into a small duodenal diverticulum.  Just caudal to the ampulla.  Normal adrenal glands.  Mild bilateral renal cortical thinning, with too small to characterize lesions in bilateral kidneys.  A circumaortic left renal vein. No retroperitoneal or retrocrural adenopathy.  Colonic stool burden suggests constipation.  Appendix is not visualized but there is no evidence of right lower quadrant inflammation.  Normal small bowel without abdominal ascites.  No pelvic adenopathy.  Moderate pelvic floor laxity.  Otherwise, normal urinary bladder.  Normal uterus and adnexa without significant free pelvic fluid.  Bones/Musculoskeletal:  Degenerative sclerosis of the bilateral  sacroiliac joints.  Grade 1 L4-L5 anterolisthesis, likely degenerative.  IMPRESSION:  1.  Apparent greater curvature gastric wall thickening, which could be secondary to underdistension.  However, given the provocative history of early satiety and epigastric pain, endoscopy should be considered to exclude gastric pathology. 2.  Otherwise, no acute process in the abdomen or pelvis. 3. Possible constipation. 4.  Prior left ventricular apical infarct. 5.  Pelvic floor laxity.   Original Report Authenticated By: Jeronimo Greaves, M.D.      1. Gastric wall thickening       MDM   Patient with waxing and waning symptoms over the last 7 months of abdominal cramping, intermittent diarrhea and anorexia. She has early satiety and states over the last week it has been much worse and she cannot eat anything. She recently saw her doctor in the last month and was placed on current medication which she states has helped the pain in her abdomen or loss of appetite is getting worse and her feeling of early fullness. She has some mild cramping after eating but states she is just stop eating. She denies any recent medication changes except for the  current medication she started this month. Weight loss between 5 and 10 pounds in the last months 2 due to not eating.  Patient is well-appearing on exam without any vital sign abnormalities. She has no focal exam findings. Patient has only had a plain x-ray done in the past for workup. Feel that LFTs and lipase would be useful.  Today we'll get a CT to evaluate for any intra-abdominal masses or other explanation for patient's symptoms. Also discussed with patient if the CAT scan was normal then she needs to follow up with gastroenterology most likely for endoscopy for further evaluation.  11:01 AM Labs are normal except for chronic hyponatremia has been unchanged for the last 6 months.  11:55 AM CT shows thickening in the greater curvature of the gastric wall. Based on the patient's history radiology recommends endoscopy for further excluding gastric pathology. Findings were discussed with the patient and she was given referral for GI    Gwyneth Sprout, MD 08/20/12 1155  Gwyneth Sprout, MD 08/20/12 1219

## 2012-08-20 NOTE — ED Notes (Signed)
Patient reports that she has had epigastric pain and generalized pain of the abdomen x 7 months. Patient was treated for GERD, but states she has had no relief. Patient states recently she has had a loss of appetite, but is not currently having abdominal pain. Patient states that she has had intermittent bouts of diarrhea. Patient denies nausea and vomiting.

## 2012-08-22 ENCOUNTER — Emergency Department (HOSPITAL_COMMUNITY)
Admission: EM | Admit: 2012-08-22 | Discharge: 2012-08-22 | Disposition: A | Discharge status: Home / Self Care | Payer: Medicare HMO | Attending: Emergency Medicine | Admitting: Emergency Medicine

## 2012-08-22 ENCOUNTER — Encounter (HOSPITAL_COMMUNITY): Payer: Self-pay | Admitting: *Deleted

## 2012-08-22 ENCOUNTER — Emergency Department (HOSPITAL_COMMUNITY): Payer: Medicare HMO

## 2012-08-22 DIAGNOSIS — I10 Essential (primary) hypertension: Secondary | ICD-10-CM | POA: Insufficient documentation

## 2012-08-22 DIAGNOSIS — Z79899 Other long term (current) drug therapy: Secondary | ICD-10-CM | POA: Insufficient documentation

## 2012-08-22 DIAGNOSIS — F411 Generalized anxiety disorder: Secondary | ICD-10-CM | POA: Insufficient documentation

## 2012-08-22 DIAGNOSIS — I252 Old myocardial infarction: Secondary | ICD-10-CM | POA: Insufficient documentation

## 2012-08-22 DIAGNOSIS — M549 Dorsalgia, unspecified: Secondary | ICD-10-CM | POA: Insufficient documentation

## 2012-08-22 DIAGNOSIS — Z7982 Long term (current) use of aspirin: Secondary | ICD-10-CM | POA: Insufficient documentation

## 2012-08-22 DIAGNOSIS — E7800 Pure hypercholesterolemia, unspecified: Secondary | ICD-10-CM | POA: Insufficient documentation

## 2012-08-22 DIAGNOSIS — E119 Type 2 diabetes mellitus without complications: Secondary | ICD-10-CM | POA: Insufficient documentation

## 2012-08-22 DIAGNOSIS — Z9889 Other specified postprocedural states: Secondary | ICD-10-CM | POA: Insufficient documentation

## 2012-08-22 MED ORDER — ZIPRASIDONE MESYLATE 20 MG IM SOLR: 20.0000 mg | Freq: Once | INTRAMUSCULAR | Status: DC

## 2012-08-22 MED ORDER — CYCLOBENZAPRINE HCL 5 MG PO TABS: 5.0000 mg | ORAL_TABLET | Freq: Three times a day (TID) | ORAL | Status: DC | PRN

## 2012-08-22 NOTE — ED Provider Notes (Signed)
History     CSN: [id number]  Arrival date & time 08/22/12  1632   First MD Initiated Contact with Patient 08/22/12 1657      Chief Complaint  Patient presents with  . Flank Pain    (Consider location/radiation/quality/duration/timing/severity/associated sxs/prior treatment) HPI Pt presents with c/o pain in left mid back underneath shoulder blade and overlying ribs.  Pain has been present intermittent over the past several years.  Pain was worse last night.  Worse with certain positions and movement.  No sob, no chest pain, no cough.  No dysuria.  Pt denies pain in low back.  States she had a ct scan earlier this week of abdomen due to epigastric pain. She has f/u appointment scheduled with GI.  No fever.  Pain is resolved at this time, she has not had any treatment.  Pain feels like a cramping pain.  No falls or trauma.  Deneis neck pain.  No rash.  There are no other associated systemic symptoms, there are no other alleviating or modifying factors.   Past Medical History  Diagnosis Date  . Hypertension   . Diabetes mellitus   . Hypercholesteremia   . Anxiety   . MI (myocardial infarction)     Past Surgical History  Procedure Laterality Date  . Cesarean section      Family History  Problem Relation Age of Onset  . Diabetes Father   . Hypertension Father   . Heart failure Father     History  Substance Use Topics  . Smoking status: Never Smoker   . Smokeless tobacco: Never Used  . Alcohol Use: No    OB History   Grav Para Term Preterm Abortions TAB SAB Ect Mult Living                  Review of Systems  ROS reviewed and all otherwise negative except for mentioned in HPI  Allergies  Review of patient's allergies indicates no known allergies.  Home Medications   Current Outpatient Rx  Name  Route  Sig  Dispense  Refill  . alendronate (FOSAMAX) 70 MG tablet   Oral   Take 70 mg by mouth every 7 (seven) days. Take with a full glass of water on an empty  stomach.  Taken on Sundays.         . ALPRAZolam (XANAX) 0.5 MG tablet   Oral   Take 0.5 mg by mouth 3 (three) times daily as needed for anxiety. anxiety         . aspirin EC 325 MG tablet   Oral   Take 325 mg by mouth daily.         Marland Kitchen atorvastatin (LIPITOR) 80 MG tablet   Oral   Take 80 mg by mouth at bedtime.          . Calcium Carbonate-Vitamin D (OS-CAL 500 + D PO)   Oral   Take 1 tablet by mouth at bedtime.          Marland Kitchen ibuprofen (ADVIL,MOTRIN) 200 MG tablet   Oral   Take 800 mg by mouth every 8 (eight) hours as needed for pain.         Marland Kitchen lisinopril (PRINIVIL,ZESTRIL) 2.5 MG tablet   Oral   Take 2.5 mg by mouth every morning.          . metFORMIN (GLUCOPHAGE) 500 MG tablet   Oral   Take 500 mg by mouth daily with breakfast.         .  omeprazole (PRILOSEC) 20 MG capsule   Oral   Take 20 mg by mouth daily.         Marland Kitchen triamterene-hydrochlorothiazide (MAXZIDE) 75-50 MG per tablet   Oral   Take 1 tablet by mouth every morning.          . cyclobenzaprine (FLEXERIL) 5 MG tablet   Oral   Take 1 tablet (5 mg total) by mouth 3 (three) times daily as needed for muscle spasms.   20 tablet   0     BP 139/64  Pulse 64  Temp(Src) 97.8 F (36.6 C) (Oral)  Resp 16  SpO2 99% Vitals reviewed Physical Exam Physical Examination: General appearance - alert, well appearing, and in no distress Mental status - alert, oriented to person, place, and time Mouth - mucous membranes moist, pharynx normal without lesions Neck - supple, no significant adenopathy, no midline tenderness to palpation, FROM without pain Chest - clear to auscultation, no wheezes, rales or rhonchi, symmetric air entry Heart - normal rate, regular rhythm, normal S1, S2, no murmurs, rubs, clicks or gallops Abdomen - soft, nontender, nondistended, no masses or organomegaly Back exam - full range of motion, no midline tenderness or paraspinal tenderness, no palpable spasm or pain on motion- pt  points to region underneath left shoulder blade and left ribcage at midaxillary line but no pain or tenderness now Neurological - alert, oriented, normal speech, strength 5/5 in extremities x 4, sensation intact Extremities - peripheral pulses normal, no pedal edema, no clubbing or cyanosis Skin - normal coloration and turgor, no rashes  ED Course  Procedures (including critical care time)  Labs Reviewed - No data to display Dg Ribs Unilateral W/chest Left  08/22/2012  *RADIOLOGY REPORT*  Clinical Data: Left posterior rib pain.  No known injuries. Current history of diabetes and hypertension.  Prior history of MI.  LEFT RIBS AND CHEST - 3+ VIEW  Comparison: No prior rib imaging.  Two-view chest x-ray 09/22/2010, 08/30/2009, 01/04/2009.  Findings: Site of maximum pain and tenderness was marked with a metallic BB.  No fractures identified involving the left ribs. Mild osseous demineralization.  No other intrinsic osseous abnormalities.  Cardiac silhouette moderately enlarged but stable.  Thoracic aorta atherosclerotic, unchanged.  Hilar and mediastinal contours otherwise unremarkable.  Stable minimal linear scar in the lingula. Lungs otherwise clear.  IMPRESSION:  1.  No left rib fractures identified. 2.  Stable cardiomegaly.  Lingular scar.  No acute cardiopulmonary disease.   Original Report Authenticated By: Hulan Saas, M.D.      1. Upper back pain on left side       MDM  Pt presenting with pain in left upper back under shoulder and rib cage.  Pt states no pain now, normal exam.  Pain worse with certain positions and movement, has been presents for over 1 year.  Doubt any acute emergent condition requiring treatment at this time.  Xray reassuring- no fracture, mets, lung abnormalities.  Discharged with strict return precautions.  Pt agreeable with plan.        Ethelda Chick, MD 08/22/12 2036

## 2012-08-22 NOTE — ED Notes (Signed)
Pt presents to ed with c/o left side flank pain. Pt sts has been having this pain for years but last night it got really bad and squeezing in nature. Pt denies urinary symptoms.

## 2012-09-10 ENCOUNTER — Encounter: Payer: Self-pay | Admitting: Internal Medicine

## 2012-09-10 HISTORY — PX: UPPER GASTROINTESTINAL ENDOSCOPY: SHX188

## 2012-09-16 ENCOUNTER — Encounter: Payer: Self-pay | Admitting: Internal Medicine

## 2012-09-16 ENCOUNTER — Ambulatory Visit (INDEPENDENT_AMBULATORY_CARE_PROVIDER_SITE_OTHER): Payer: Medicare HMO | Admitting: Internal Medicine

## 2012-09-16 VITALS — BP 128/64 | HR 80 | Ht 61.0 in | Wt 130.4 lb

## 2012-09-16 DIAGNOSIS — E119 Type 2 diabetes mellitus without complications: Secondary | ICD-10-CM | POA: Insufficient documentation

## 2012-09-16 DIAGNOSIS — R1013 Epigastric pain: Secondary | ICD-10-CM

## 2012-09-16 DIAGNOSIS — R6881 Early satiety: Secondary | ICD-10-CM

## 2012-09-16 DIAGNOSIS — R634 Abnormal weight loss: Secondary | ICD-10-CM

## 2012-09-16 DIAGNOSIS — R9389 Abnormal findings on diagnostic imaging of other specified body structures: Secondary | ICD-10-CM

## 2012-09-16 DIAGNOSIS — E785 Hyperlipidemia, unspecified: Secondary | ICD-10-CM | POA: Insufficient documentation

## 2012-09-16 DIAGNOSIS — I10 Essential (primary) hypertension: Secondary | ICD-10-CM | POA: Insufficient documentation

## 2012-09-16 NOTE — Patient Instructions (Addendum)
You have been scheduled for an endoscopy with propofol. Please follow written instructions given to you at your visit today. If you use inhalers (even only as needed), please bring them with you on the day of your procedure. Your physician has requested that you go to www.[email or web address] and enter the access code given to you at your visit today. This web site gives a general overview about your procedure. However, you should still follow specific instructions given to you by our office regarding your preparation for the procedure.                                                We are excited to introduce MyChart, a new best-in-class service that provides you online access to important information in your electronic medical record. We want to make it easier for you to view your health information - all in one secure location - when and where you need it. We expect MyChart will enhance the quality of care and service we provide.  When you register for MyChart, you can:    View your test results.    Request appointments and receive appointment reminders via email.    Request medication renewals.    View your medical history, allergies, medications and immunizations.    Communicate with your physician's office through a password-protected site.    Conveniently print information such as your medication lists.  To find out if MyChart is right for you, please talk to a member of our clinical staff today. We will gladly answer your questions about this free health and wellness tool.  If you are age 77 or older and want a member of your family to have access to your record, you must provide written consent by completing a proxy form available at our office. Please speak to our clinical staff about guidelines regarding accounts for patients younger than age 16.  As you activate your MyChart account and need any technical assistance, please call the MyChart technical support line at [phone]  [phone]) or email your question to [email or web address]. If you email your question(s), please include your name, a return phone number and the best time to reach you.  If you have non-urgent health-related questions, you can send a message to our office through MyChart at [email or web address]. If you have a medical emergency, call 911.  Thank you for using MyChart as your new health and wellness resource!   MyChart licensed from [employer],  [id number]. Patents Pending.

## 2012-09-16 NOTE — Progress Notes (Signed)
Patient ID: Karen Arias, female   DOB: 09-Feb-1929, 79 y.o.   MRN: [id number] HPI: Karen Arias is an 79 year old female with a past medical history of hypertension, hypercholesteremia, diabetes who seen in consultation at the request of Dr. Su Hilt for evaluation of abnormal GI imaging and epigastric abdominal pain. The patient is alone today. She reports on-and-off trouble with her stomach for "years". She reports several months of epigastric abdominal pain, worse with eating. She reports having a "hard time eating", and with this has lost about 8-10 pounds. She also endorses early satiety. She said overall weakness and fatigue, and over this time. Has been unable to work out. She was previously working out on a daily basis at a [employer]. She denies nausea or vomiting. No trouble with significant heartburn, dysphagia or odynophagia. She reports her bowel movements are "good" happening on a daily basis without constipation or diarrhea. She denies melena or blood in her stools. She does use ibuprofen frequently for joint pains. She was started on omeprazole 20 mg daily within the past few months and she reports this significantly helped her epigastric pain. The pain is now gone, but she still has early satiety and has not been able to gain her weight back  She was seen in the emergency department in April 2014 and had a CT scan and was told she had a thickened stomach. This led to this visit   Past Medical History  Diagnosis Date  . Hypertension   . Diabetes mellitus   . Hypercholesteremia   . Anxiety   . MI (myocardial infarction)    Past Surgical History  Procedure Laterality Date  . Cesarean section      Current Outpatient Prescriptions  Medication Sig Dispense Refill  . alendronate (FOSAMAX) 70 MG tablet Take 70 mg by mouth every 7 (seven) days. Take with a full glass of water on an empty stomach.  Taken on Sundays.      . ALPRAZolam (XANAX) 0.5 MG tablet Take 0.5 mg by mouth 3  (three) times daily as needed for anxiety. anxiety      . aspirin EC 325 MG tablet Take 325 mg by mouth daily.      Marland Kitchen atorvastatin (LIPITOR) 80 MG tablet Take 80 mg by mouth at bedtime.       . Calcium Carbonate-Vitamin D (OS-CAL 500 + D PO) Take 1 tablet by mouth at bedtime.       . cyclobenzaprine (FLEXERIL) 5 MG tablet Take 1 tablet (5 mg total) by mouth 3 (three) times daily as needed for muscle spasms.  20 tablet  0  . ibuprofen (ADVIL,MOTRIN) 200 MG tablet Take 800 mg by mouth every 8 (eight) hours as needed for pain.      Marland Kitchen lisinopril (PRINIVIL,ZESTRIL) 2.5 MG tablet Take 2.5 mg by mouth every morning.       . metFORMIN (GLUCOPHAGE) 500 MG tablet Take 500 mg by mouth daily with breakfast.      . omeprazole (PRILOSEC) 20 MG capsule Take 20 mg by mouth daily.      Marland Kitchen triamterene-hydrochlorothiazide (MAXZIDE) 75-50 MG per tablet Take 1 tablet by mouth every morning.        No current facility-administered medications for this visit.    No Known Allergies  Family History  Problem Relation Age of Onset  . Diabetes Father   . Hypertension Father   . Heart failure Father     History  Substance Use Topics  . Smoking status: Never  Smoker   . Smokeless tobacco: Never Used  . Alcohol Use: No    ROS: As per history of present illness, otherwise negative  BP 128/64  Pulse 80  Ht 5\' 1"  (1.549 m)  Wt 130 lb 6.4 oz (59.149 kg)  BMI 24.65 kg/m2 Constitutional: Well-developed and well-nourished. No distress. HEENT: Normocephalic and atraumatic. Oropharynx is clear and moist. No oropharyngeal exudate. Conjunctivae are normal.  No scleral icterus. Neck: Neck supple. Trachea midline. Cardiovascular: Normal rate, regular rhythm and intact distal pulses. Pulmonary/chest: Effort normal and breath sounds normal. No wheezing, rales or rhonchi. Abdominal: Soft, nontender, nondistended. Bowel sounds active throughout. There are no masses palpable.  Extremities: no clubbing, cyanosis, or  edema Lymphadenopathy: No cervical adenopathy noted. Neurological: Alert and oriented to person place and time. Skin: Skin is warm and dry. No rashes noted. Psychiatric: Normal mood and affect. Behavior is normal.  RELEVANT LABS AND IMAGING: CBC    Component Value Date/Time   WBC 4.3 08/20/2012 0950   RBC 4.00 08/20/2012 0950   HGB 11.8* 08/20/2012 0950   HCT 35.1* 08/20/2012 0950   PLT 242 08/20/2012 0950   MCV 87.8 08/20/2012 0950   MCH 29.5 08/20/2012 0950   MCHC 33.6 08/20/2012 0950   RDW 12.2 08/20/2012 0950   LYMPHSABS 1.3 08/20/2012 0950   MONOABS 0.5 08/20/2012 0950   EOSABS 0.0 08/20/2012 0950   BASOSABS 0.0 08/20/2012 0950    CMP     Component Value Date/Time   NA 127* 08/20/2012 0950   K 4.0 08/20/2012 0950   CL 89* 08/20/2012 0950   CO2 27 08/20/2012 0950   GLUCOSE 110* 08/20/2012 0950   BUN 15 08/20/2012 0950   CREATININE 0.79 08/20/2012 0950   CALCIUM 9.5 08/20/2012 0950   PROT 7.0 08/20/2012 0950   ALBUMIN 3.7 08/20/2012 0950   AST 31 08/20/2012 0950   ALT 18 08/20/2012 0950   ALKPHOS 45 08/20/2012 0950   BILITOT 0.3 08/20/2012 0950   GFRNONAA 75* 08/20/2012 0950   GFRAA 87* 08/20/2012 0950   Clinical Data: Early satiety and upper abdominal pain.  Diarrhea. Gastroesophageal reflux disease.   CT ABDOMEN AND PELVIS WITH CONTRAST 08/20/2012   Technique:  Multidetector CT imaging of the abdomen and pelvis was performed following the standard protocol during bolus administration of intravenous contrast.   Contrast: 50mL OMNIPAQUE IOHEXOL 300 MG/ML  SOLN, OMNIPAQUE IOHEXOL 300 MG/ML  SOLN   Comparison: Plain film 12/17/2011.  No prior CT.   Findings: Lung bases:  Bibasilar scarring.  Moderate cardiomegaly. A prior left apical infarct with thinning and calcification, including on image 6/series 2.  Right coronary artery disease.   Abdomen/pelvis:  Old granulomatous disease within the liver. Normal spleen. The gastric body is underdistended.  Apparent wall thickening  involving the greater curvature could be secondary. Example at 1.8 cm on coronal image 28 and transverse image 21/series 2.   Normal pancreas, gallbladder, biliary tract.  Increased density in the region of the pancreatic head on image 24 is favored to represent contrast reflux into a small duodenal diverticulum.  Just caudal to the ampulla.   Normal adrenal glands.  Mild bilateral renal cortical thinning, with too small to characterize lesions in bilateral kidneys.   A circumaortic left renal vein. No retroperitoneal or retrocrural adenopathy.   Colonic stool burden suggests constipation.   Appendix is not visualized but there is no evidence of right lower quadrant inflammation.  Normal small bowel without abdominal ascites.   No pelvic adenopathy.  Moderate pelvic floor laxity.  Otherwise, normal urinary bladder.  Normal uterus and adnexa without significant free pelvic fluid.   Bones/Musculoskeletal:  Degenerative sclerosis of the bilateral sacroiliac joints.  Grade 1 L4-L5 anterolisthesis, likely degenerative.   IMPRESSION:   1.  Apparent greater curvature gastric wall thickening, which could be secondary to underdistension.  However, given the provocative history of early satiety and epigastric pain, endoscopy should be considered to exclude gastric pathology. 2.  Otherwise, no acute process in the abdomen or pelvis. 3. Possible constipation. 4.  Prior left ventricular apical infarct. 5.  Pelvic floor laxity.   ASSESSMENT/PLAN: 79 year old female with a past medical history of hypertension, hypercholesteremia, diabetes who seen in consultation at the request of Dr. Su Hilt for evaluation of abnormal GI imaging and epigastric abdominal pain.   1. Abnormal GI imaging/weight loss/epigastric pain -- the patient does use NSAIDs and so certainly could have a gastric ulcer. Given her age and abnormal GI imaging, with weight loss, I recommended an upper endoscopy. This will  directly visualized stomach and rule out any mucosal abnormalities. For now she will continue with omeprazole 20 mg daily as it has seemed to help her epigastric pain. Further recommendations to be made after EGD. EGD has been scheduled for tomorrow. We discussed the risks and benefits and she is agreeable to proceed

## 2012-09-17 ENCOUNTER — Encounter: Payer: Self-pay | Admitting: *Deleted

## 2012-09-17 ENCOUNTER — Encounter: Payer: Self-pay | Admitting: Internal Medicine

## 2012-09-17 ENCOUNTER — Ambulatory Visit (AMBULATORY_SURGERY_CENTER): Payer: Medicare HMO | Admitting: Internal Medicine

## 2012-09-17 ENCOUNTER — Other Ambulatory Visit: Payer: Self-pay | Admitting: Internal Medicine

## 2012-09-17 VITALS — BP 133/75 | HR 87 | Temp 97.1°F | Resp 20 | Ht 61.0 in | Wt 130.0 lb

## 2012-09-17 DIAGNOSIS — K2970 Gastritis, unspecified, without bleeding: Secondary | ICD-10-CM

## 2012-09-17 DIAGNOSIS — R1013 Epigastric pain: Secondary | ICD-10-CM

## 2012-09-17 DIAGNOSIS — R634 Abnormal weight loss: Secondary | ICD-10-CM

## 2012-09-17 DIAGNOSIS — A048 Other specified bacterial intestinal infections: Secondary | ICD-10-CM

## 2012-09-17 DIAGNOSIS — R933 Abnormal findings on diagnostic imaging of other parts of digestive tract: Secondary | ICD-10-CM

## 2012-09-17 DIAGNOSIS — K2990 Gastroduodenitis, unspecified, without bleeding: Secondary | ICD-10-CM

## 2012-09-17 MED ORDER — SODIUM CHLORIDE 0.9 % IV SOLN: 500.0000 mL | INTRAVENOUS | Status: DC

## 2012-09-17 NOTE — Progress Notes (Signed)
Report to pacu rn, vss, bbs=clear 

## 2012-09-17 NOTE — Progress Notes (Signed)
Patient did not experience any of the following events: a burn prior to discharge; a fall within the facility; wrong site/side/patient/procedure/implant event; or a hospital transfer or hospital admission upon discharge from the facility. (G8907) Patient did not have preoperative order for IV antibiotic SSI prophylaxis. (G8918)  

## 2012-09-17 NOTE — Op Note (Signed)
[facility] [address] [place], [address]   ENDOSCOPY PROCEDURE REPORT  PATIENT: Karen, Arias  MR#: [id number] BIRTHDATE: Sep 20, 1928 , 84  yrs. old GENDER: Female ENDOSCOPIST: Beverley Fiedler, MD REFERRED BY:  Burton Apley PROCEDURE DATE:  09/17/2012 PROCEDURE:  EGD w/ biopsy ASA CLASS:     Class III INDICATIONS:  Epigastric pain.   abnormal CT of the GI tract. Weight loss. MEDICATIONS: MAC sedation, administered by CRNA and Propofol (Diprivan) 80 mg IV TOPICAL ANESTHETIC: Cetacaine Spray  DESCRIPTION OF PROCEDURE: After the risks benefits and alternatives of the procedure were thoroughly explained, informed consent was obtained.  The LB [id number] endoscope was introduced through the mouth and advanced to the second portion of the duodenum. Without limitations.  The instrument was slowly withdrawn as the mucosa was fully examined.    ESOPHAGUS: The mucosa of the esophagus appeared normal.   A normal Z-line was observed 37 cm from the incisors.   A 4 cm hiatal hernia was noted.  STOMACH: Mild and congested gastropathy was found in the gastric body and gastric antrum.  Multiple biopsies were performed using cold forceps.  DUODENUM: The duodenal mucosa showed no abnormalities in the bulb and second portion of the duodenum.  Retroflexed views revealed a hiatal hernia.     The scope was then withdrawn from the patient and the procedure completed.  COMPLICATIONS: There were no complications. ENDOSCOPIC IMPRESSION: 1.   The mucosa of the esophagus appeared normal 2.   Normal Z-line was observed 37 cm from the incisors 3.   4 cm hiatal hernia 4.   Gastropathy was found in the gastric body and gastric antrum; multiple biopsies 5.   The duodenal mucosa showed no abnormalities in the bulb and second portion of the duodenum  RECOMMENDATIONS: 1.  Await pathology results 2.  Continue current medications 3.  Office follow-up in about 1  month  eSigned:  Beverley Fiedler, MD 09/17/2012 9:58 AM         CC:The Patient

## 2012-09-17 NOTE — Patient Instructions (Addendum)

## 2012-09-17 NOTE — Progress Notes (Signed)
Called to room to assist during endoscopic procedure.  Patient ID and intended procedure confirmed with present staff. Received instructions for my participation in the procedure from the performing physician.  

## 2012-09-18 ENCOUNTER — Telehealth: Payer: Self-pay | Admitting: *Deleted

## 2012-09-18 ENCOUNTER — Encounter: Payer: Self-pay | Admitting: Internal Medicine

## 2012-09-18 NOTE — Telephone Encounter (Signed)
  Follow up Call-  Call back number 09/17/2012  Post procedure Call Back phone  # [phone]  Permission to leave phone message Yes     Patient questions:  Do you have a fever, pain , or abdominal swelling? no Pain Score  0 *  Have you tolerated food without any problems? yes  Have you been able to return to your normal activities? yes  Do you have any questions about your discharge instructions: Diet   no Medications  no Follow up visit  no  Do you have questions or concerns about your Care? no  Actions: * If pain score is 4 or above: No action needed, pain <4.

## 2012-09-23 ENCOUNTER — Encounter: Payer: Self-pay | Admitting: Internal Medicine

## 2012-09-23 ENCOUNTER — Telehealth: Payer: Self-pay | Admitting: *Deleted

## 2012-09-23 MED ORDER — BIS SUBCIT-METRONID-TETRACYC 140-125-125 MG PO CAPS: 3.0000 | ORAL_CAPSULE | Freq: Three times a day (TID) | ORAL | Status: DC

## 2012-09-23 NOTE — Telephone Encounter (Signed)
Informed pt of + H. Pylori and the need for an AB; she must take omeprazole bid while taking the Ab. Pt will f/u on 10/20/12. Pt will call back if pylera is too expensive.

## 2012-09-23 NOTE — Telephone Encounter (Signed)
Message copied by Florene Glen on Wed Sep 23, 2012 12:01 PM ------      Message from: Beverley Fiedler      Created: Wed Sep 23, 2012 10:22 AM       H. pylori positive, please treat with Pylera.  She will need her PPI increase to twice daily during therapy      She should see me in the office in about 4-6 weeks ------

## 2012-09-30 ENCOUNTER — Telehealth: Payer: Self-pay | Admitting: Internal Medicine

## 2012-10-01 NOTE — Telephone Encounter (Signed)
She can take the medication with or without food, the importance is getting and the correct number of doses each day I expect some of her anorexia is H. pylori related, so taking this medication as prescribed for the entire course is very important if at all possible She should have followup after treatment

## 2012-10-01 NOTE — Telephone Encounter (Signed)
Pt dx with H.Pylori and should have started Pylera around 09/23/12. She reports a loss of appetite with the me and is afraid to take it if she doesn't eat. Yesterday she didn't take it at all. She has taken her morning dose this am. She states her stomach pain has improved and she feels good. Can pt take med if she doesn't eat? Advice on anorexia. Thanks.

## 2012-10-01 NOTE — Telephone Encounter (Signed)
Informed pt of Dr Lauro Franklin instructions and encouraged her to complete the script for Pylera. Mailed her the appt with Dr Rhea Belton; pt stated understanding and will call for further problems.

## 2012-10-07 ENCOUNTER — Telehealth: Payer: Self-pay | Admitting: Internal Medicine

## 2012-10-07 NOTE — Telephone Encounter (Signed)
Pt reports she completed the Pylera, but her appetite has not come back yet. She complained of this last week and we encouraged her to complete the Pylera for H. Pylori. She eats a fairly big breakfast followed by a lactose free boost, then she doesn't want anything the rest of the day. She states just just doesn't feel good; nothing hurts, just feels sluggish. She also reports her stool is dark. Explained bismuth subcitrate in the Pylera can turn stools to a darker color. Pt was r/s to 10/09/12. Any advise to increase pt's appetite? Thanks.

## 2012-10-08 NOTE — Telephone Encounter (Signed)
lmom for pt to call back

## 2012-10-08 NOTE — Telephone Encounter (Signed)
We can discuss this tomorrow during her office follow-up visit.

## 2012-10-09 ENCOUNTER — Ambulatory Visit (INDEPENDENT_AMBULATORY_CARE_PROVIDER_SITE_OTHER): Payer: Medicare HMO | Admitting: Internal Medicine

## 2012-10-09 ENCOUNTER — Encounter: Payer: Self-pay | Admitting: Internal Medicine

## 2012-10-09 VITALS — BP 100/68 | HR 80 | Ht 61.0 in | Wt 127.8 lb

## 2012-10-09 DIAGNOSIS — A048 Other specified bacterial intestinal infections: Secondary | ICD-10-CM | POA: Insufficient documentation

## 2012-10-09 DIAGNOSIS — K219 Gastro-esophageal reflux disease without esophagitis: Secondary | ICD-10-CM

## 2012-10-09 DIAGNOSIS — R630 Anorexia: Secondary | ICD-10-CM

## 2012-10-09 MED ORDER — OMEPRAZOLE 20 MG PO CPDR: 20.0000 mg | DELAYED_RELEASE_CAPSULE | Freq: Every day | ORAL | Status: DC

## 2012-10-09 MED ORDER — MEGESTROL ACETATE 40 MG/ML PO SUSP: 800.0000 mg | Freq: Every day | ORAL | Status: DC

## 2012-10-09 NOTE — Patient Instructions (Addendum)
We have sent the following medications to your pharmacy for you to pick up at your convenience: Megace; Start taking daily if well tolerated increase to 20 ML daily  take omeprazole daily  Continue taking Boost  Please stop down in our lab in 1 month for an H-pylori stool study.   Follow up with Dr. Rhea Belton in 6 weeks in office                                               We are excited to introduce MyChart, a new best-in-class service that provides you online access to important information in your electronic medical record. We want to make it easier for you to view your health information - all in one secure location - when and where you need it. We expect MyChart will enhance the quality of care and service we provide.  When you register for MyChart, you can:    View your test results.    Request appointments and receive appointment reminders via email.    Request medication renewals.    View your medical history, allergies, medications and immunizations.    Communicate with your physician's office through a password-protected site.    Conveniently print information such as your medication lists.  To find out if MyChart is right for you, please talk to a member of our clinical staff today. We will gladly answer your questions about this free health and wellness tool.  If you are age 77 or older and want a member of your family to have access to your record, you must provide written consent by completing a proxy form available at our office. Please speak to our clinical staff about guidelines regarding accounts for patients younger than age 77.  As you activate your MyChart account and need any technical assistance, please call the MyChart technical support line at [phone]) [phone]-CHART [phone]) or email your question to [email or web address]. If you email your question(s), please include your name, a return phone number and the best time to reach you.  If you have  non-urgent health-related questions, you can send a message to our office through MyChart at [email or web address]. If you have a medical emergency, call 911.  Thank you for using MyChart as your new health and wellness resource!   MyChart licensed from [employer],  4540-9811. Patents Pending.

## 2012-10-09 NOTE — Progress Notes (Signed)
  Subjective:    Patient ID: Karen Arias, female    DOB: 1929/01/17, 79 y.o.   MRN: [id number]  HPI Karen Arias is an 79 year old female with a past medical history of hypertension, hypercholesteremia, diabetes and recent H. Pylori who returns for followup. She presented for upper endoscopy on 09/17/2012 to evaluate epigastric abdominal pain and thickening seen by CT. EGD revealed 4 cm hiatal hernia and antral gastropathy. Biopsies proved positive for H. Pylori.  She was treated with and completed Pylera as prescribed. She did take twice daily PPI during antibiotic therapy. She has continued to have issues with poor appetite, but her abdominal and epigastric pain have completely resolved. Her bowel movements have remained normal without blood or melena. She is continuing on omeprazole 20 mg daily. She denies early satiety, nausea or vomiting. She has somewhat been forcing herself to eat. She feels that yesterday was one of her best days eating.  She reports yesterday she ate a chicken biscuit with grits and apples for breakfast, soup and a chicken sandwich for lunch, and fruit with Boost for dinner.  No fevers or chills.  She was given a prescription for amitriptyline by her primary care provider. She feels this was to help her appetite. She took one dose and complained of feeling "dizzy and unbalanced" in the next day reports she "could not function" and that she has not taken this medication again.  Her weight today is 127.75 lbs, down from 130 at last visit.  Review of Systems As per HPI otherwise negative  Current Medications, Allergies, Past Medical History, Past Surgical History, Family History and Social History were reviewed in [employer] record.     Objective:   Physical Exam BP 100/68  Pulse 80  Ht 5\' 1"  (1.549 m)  Wt 127 lb 12.8 oz (57.97 kg)  BMI 24.16 kg/m2 Constitutional: Well-developed and well-nourished. No distress. HEENT: Normocephalic and  atraumatic. Oropharynx is clear and moist. No oropharyngeal exudate. Conjunctivae are normal.  No scleral icterus. Neck: Neck supple. Trachea midline. Cardiovascular: Normal rate, regular rhythm and intact distal pulses.  Pulmonary/chest: Effort normal and breath sounds normal. No wheezing, rales or rhonchi. Abdominal: Soft, nontender, nondistended. Bowel sounds active throughout. There are no masses palpable. No hepatosplenomegaly. Extremities: no clubbing, cyanosis, or edema Lymphadenopathy: No cervical adenopathy noted. Neurological: Alert and oriented to person place and time. Skin: Skin is warm and dry. No rashes noted. Psychiatric: Normal mood and affect. Behavior is normal.     Assessment & Plan:  79 year old female with a past medical history of hypertension, hypercholesteremia, diabetes and recent H. Pylori who returns for followup.  1.  Treated H. pylori gastritis/anorexia -- her epigastric abdominal pain resolved and she completed H. pylori therapy. Her anorexia has persisted and she is requesting an appetite stimulant. She feels that if she could be better and more consistently she would have more energy. Fortunately she has had no further pain or other alarm symptoms such as bleeding. However like for her to continue omeprazole 20 mg daily. I will give her a trial of Megace 400 mg once daily x5 days. If this is well tolerated she can increase to 800 mg daily thereafter. I would like to see her back in 4-6 weeks for reassessment. Around that time we'll also check an H. pylori stool antigen to confirm eradication. She understands and is happy with this plan of action.

## 2012-10-09 NOTE — Telephone Encounter (Signed)
Pt reminded of her appt today and that Dr Rhea Belton will discuss her loss of appetite then; pt stated understanding.

## 2012-10-20 ENCOUNTER — Ambulatory Visit: Payer: Medicare HMO | Admitting: Internal Medicine

## 2012-11-16 ENCOUNTER — Encounter: Payer: Self-pay | Admitting: Internal Medicine

## 2012-11-23 ENCOUNTER — Ambulatory Visit (INDEPENDENT_AMBULATORY_CARE_PROVIDER_SITE_OTHER): Payer: Medicare HMO | Admitting: Internal Medicine

## 2012-11-23 ENCOUNTER — Encounter: Payer: Self-pay | Admitting: Internal Medicine

## 2012-11-23 VITALS — BP 100/50 | HR 80 | Ht 60.5 in | Wt 128.1 lb

## 2012-11-23 DIAGNOSIS — K219 Gastro-esophageal reflux disease without esophagitis: Secondary | ICD-10-CM

## 2012-11-23 DIAGNOSIS — Z8619 Personal history of other infectious and parasitic diseases: Secondary | ICD-10-CM

## 2012-11-23 DIAGNOSIS — R630 Anorexia: Secondary | ICD-10-CM

## 2012-11-23 MED ORDER — MEGESTROL ACETATE 40 MG/ML PO SUSP: 400.0000 mg | Freq: Every day | ORAL | Status: DC

## 2012-11-23 MED ORDER — OMEPRAZOLE 20 MG PO CPDR: 20.0000 mg | DELAYED_RELEASE_CAPSULE | Freq: Every day | ORAL | Status: DC

## 2012-11-23 NOTE — Patient Instructions (Addendum)
We have sent the following medications to your pharmacy for you to pick up at your convenience: Megace, take 10 ml daily if more appetite stimulation is needed start taking 20 ml daily.                                                We are excited to introduce MyChart, a new best-in-class service that provides you online access to important information in your electronic medical record. We want to make it easier for you to view your health information - all in one secure location - when and where you need it. We expect MyChart will enhance the quality of care and service we provide.  When you register for MyChart, you can:    View your test results.    Request appointments and receive appointment reminders via email.    Request medication renewals.    View your medical history, allergies, medications and immunizations.    Communicate with your physician's office through a password-protected site.    Conveniently print information such as your medication lists.  To find out if MyChart is right for you, please talk to a member of our clinical staff today. We will gladly answer your questions about this free health and wellness tool.  If you are age 77 or older and want a member of your family to have access to your record, you must provide written consent by completing a proxy form available at our office. Please speak to our clinical staff about guidelines regarding accounts for patients younger than age 57.  As you activate your MyChart account and need any technical assistance, please call the MyChart technical support line at (336) [phone]-CHART [phone]) or email your question to [email or web address]. If you email your question(s), please include your name, a return phone number and the best time to reach you.  If you have non-urgent health-related questions, you can send a message to our office through MyChart at [email or web address]. If you have a medical emergency, call  911.  Thank you for using MyChart as your new health and wellness resource!   MyChart licensed from [employer],  [id number]. Patents Pending.

## 2012-11-23 NOTE — Progress Notes (Signed)
  Subjective:    Patient ID: Karen Arias, female    DOB: [birth year], 79 y.o.   MRN: [id number]  HPI [name] is an 79 year old female with a past medical history of hypertension, hypercholesteremia, diabetes and recent H. Pylori with anorexia who returns for followup. She presented for upper endoscopy on 09/17/2012 to evaluate epigastric abdominal pain and thickening seen by CT. EGD revealed 4 cm hiatal hernia and antral gastropathy. Biopsies proved positive for H. Pylori. She was treated with and completed Pylera as prescribed. After her last visit she was continuing to have poor appetite without abdominal pain and she was started on Megace 400 mg daily. She reports that this significantly helped her appetite and she took it for an entire month. She did not have a refill and thus is now off this medication and when coming off she's noticed her appetite has declined again.  She is continued to have no abdominal pain, nausea or vomiting. She is continuing to take omeprazole 20 mg daily. No fevers or chills.  Review of Systems As per history of present illness, otherwise negative  Current Medications, Allergies, Past Medical History, Past Surgical History, Family History and Social History were reviewed in [employer] record.      Objective:   Physical Exam BP 100/50  Pulse 80  Ht 5' 0.5" (1.537 m)  Wt 128 lb 2 oz (58.117 kg)  BMI 24.6 kg/m2 Constitutional: Well-developed and well-nourished. No distress. HEENT: Normocephalic and atraumatic. No scleral icterus. Neurological: Alert and oriented to person place and time. Psychiatric: Normal mood and affect. Behavior is normal.  Weight at last visit 127 pounds     Assessment & Plan:  79 year old female with a past medical history of hypertension, hypercholesteremia, diabetes and recent H. Pylori with anorexia who returns for followup  1.  H pylori -- treated I would like to confirm eradication. We discussed  stool antigen versus breath test and she would like to proceed with breath testing. We will schedule this but she will need to be off PPI for the appropriate amount of time before this test is performed. I would like to confirm eradication particularly given that she has continued to have issues with poor appetite.  2.  Poor appetite -- improved with Megace for her prescription ran out. I would like her as a Megace 400 mg daily. If this is helping but incompletely she can increase to 800 mg daily. Her weight is stable since her last visit which is reassuring. It is also reassuring that she is having no abdominal pain nausea or vomiting as she was before. I would like her to continue the appetite stimulant for the next 3 months and then see me at that time.  She can be seen sooner if necessary

## 2012-12-03 ENCOUNTER — Telehealth: Payer: Self-pay | Admitting: Internal Medicine

## 2012-12-03 NOTE — Telephone Encounter (Signed)
Instructed pt on NPO status after 9PM the night before and foods to eat and avoid the day before her Breath Test. Pt stated understanding.

## 2012-12-16 ENCOUNTER — Other Ambulatory Visit: Payer: Self-pay

## 2013-01-25 ENCOUNTER — Other Ambulatory Visit: Payer: Self-pay

## 2013-01-25 DIAGNOSIS — Z1231 Encounter for screening mammogram for malignant neoplasm of breast: Secondary | ICD-10-CM

## 2013-01-27 ENCOUNTER — Telehealth: Payer: Self-pay | Admitting: *Deleted

## 2013-01-27 NOTE — Telephone Encounter (Signed)
Pt called for results of her breath test in July. I can find no results, so I requested the company send me another result. Pt will be at [phone]. Received report that the breath test was negative and informed the pt.

## 2013-02-05 ENCOUNTER — Encounter: Payer: Self-pay | Admitting: Internal Medicine

## 2013-02-09 ENCOUNTER — Ambulatory Visit
Admission: RE | Admit: 2013-02-09 | Discharge: 2013-02-09 | Disposition: A | Discharge status: Home / Self Care | Payer: Medicare HMO | Source: Ambulatory Visit

## 2013-02-09 DIAGNOSIS — Z1231 Encounter for screening mammogram for malignant neoplasm of breast: Secondary | ICD-10-CM

## 2013-03-18 ENCOUNTER — Other Ambulatory Visit: Payer: Self-pay

## 2014-01-14 ENCOUNTER — Inpatient Hospital Stay (HOSPITAL_COMMUNITY)
Admission: EM | Admit: 2014-01-14 | Discharge: 2014-02-01 | DRG: 982 | Disposition: A | Discharge status: Skilled Nursing Facility | Payer: Medicare HMO | Attending: Internal Medicine | Admitting: Internal Medicine

## 2014-01-14 ENCOUNTER — Emergency Department (HOSPITAL_COMMUNITY): Payer: Medicare HMO

## 2014-01-14 ENCOUNTER — Encounter (HOSPITAL_COMMUNITY): Payer: Self-pay | Admitting: Emergency Medicine

## 2014-01-14 DIAGNOSIS — Z79899 Other long term (current) drug therapy: Secondary | ICD-10-CM

## 2014-01-14 DIAGNOSIS — C252 Malignant neoplasm of tail of pancreas: Secondary | ICD-10-CM | POA: Diagnosis present

## 2014-01-14 DIAGNOSIS — K648 Other hemorrhoids: Secondary | ICD-10-CM | POA: Diagnosis present

## 2014-01-14 DIAGNOSIS — Z8249 Family history of ischemic heart disease and other diseases of the circulatory system: Secondary | ICD-10-CM | POA: Diagnosis not present

## 2014-01-14 DIAGNOSIS — R1030 Lower abdominal pain, unspecified: Secondary | ICD-10-CM

## 2014-01-14 DIAGNOSIS — Y833 Surgical operation with formation of external stoma as the cause of abnormal reaction of the patient, or of later complication, without mention of misadventure at the time of the procedure: Secondary | ICD-10-CM | POA: Diagnosis not present

## 2014-01-14 DIAGNOSIS — E119 Type 2 diabetes mellitus without complications: Secondary | ICD-10-CM | POA: Diagnosis present

## 2014-01-14 DIAGNOSIS — N179 Acute kidney failure, unspecified: Secondary | ICD-10-CM | POA: Diagnosis not present

## 2014-01-14 DIAGNOSIS — Z833 Family history of diabetes mellitus: Secondary | ICD-10-CM

## 2014-01-14 DIAGNOSIS — E7800 Pure hypercholesterolemia, unspecified: Secondary | ICD-10-CM | POA: Diagnosis present

## 2014-01-14 DIAGNOSIS — K56609 Unspecified intestinal obstruction, unspecified as to partial versus complete obstruction: Secondary | ICD-10-CM

## 2014-01-14 DIAGNOSIS — E785 Hyperlipidemia, unspecified: Secondary | ICD-10-CM | POA: Diagnosis present

## 2014-01-14 DIAGNOSIS — N9989 Other postprocedural complications and disorders of genitourinary system: Secondary | ICD-10-CM | POA: Diagnosis not present

## 2014-01-14 DIAGNOSIS — R1012 Left upper quadrant pain: Secondary | ICD-10-CM

## 2014-01-14 DIAGNOSIS — R339 Retention of urine, unspecified: Secondary | ICD-10-CM | POA: Diagnosis not present

## 2014-01-14 DIAGNOSIS — IMO0002 Reserved for concepts with insufficient information to code with codable children: Secondary | ICD-10-CM | POA: Diagnosis not present

## 2014-01-14 DIAGNOSIS — Z791 Long term (current) use of non-steroidal anti-inflammatories (NSAID): Secondary | ICD-10-CM

## 2014-01-14 DIAGNOSIS — I10 Essential (primary) hypertension: Secondary | ICD-10-CM | POA: Diagnosis present

## 2014-01-14 DIAGNOSIS — Z7401 Bed confinement status: Secondary | ICD-10-CM

## 2014-01-14 DIAGNOSIS — R109 Unspecified abdominal pain: Secondary | ICD-10-CM | POA: Diagnosis present

## 2014-01-14 DIAGNOSIS — E876 Hypokalemia: Secondary | ICD-10-CM | POA: Diagnosis not present

## 2014-01-14 DIAGNOSIS — R1084 Generalized abdominal pain: Secondary | ICD-10-CM

## 2014-01-14 DIAGNOSIS — I252 Old myocardial infarction: Secondary | ICD-10-CM

## 2014-01-14 DIAGNOSIS — K632 Fistula of intestine: Secondary | ICD-10-CM

## 2014-01-14 DIAGNOSIS — Z7982 Long term (current) use of aspirin: Secondary | ICD-10-CM | POA: Diagnosis not present

## 2014-01-14 DIAGNOSIS — K5730 Diverticulosis of large intestine without perforation or abscess without bleeding: Secondary | ICD-10-CM | POA: Diagnosis present

## 2014-01-14 DIAGNOSIS — R634 Abnormal weight loss: Secondary | ICD-10-CM | POA: Diagnosis present

## 2014-01-14 DIAGNOSIS — Y838 Other surgical procedures as the cause of abnormal reaction of the patient, or of later complication, without mention of misadventure at the time of the procedure: Secondary | ICD-10-CM | POA: Diagnosis not present

## 2014-01-14 DIAGNOSIS — K5909 Other constipation: Secondary | ICD-10-CM

## 2014-01-14 DIAGNOSIS — R1904 Left lower quadrant abdominal swelling, mass and lump: Secondary | ICD-10-CM

## 2014-01-14 DIAGNOSIS — R630 Anorexia: Secondary | ICD-10-CM | POA: Diagnosis present

## 2014-01-14 DIAGNOSIS — R933 Abnormal findings on diagnostic imaging of other parts of digestive tract: Secondary | ICD-10-CM

## 2014-01-14 DIAGNOSIS — R1902 Left upper quadrant abdominal swelling, mass and lump: Secondary | ICD-10-CM

## 2014-01-14 LAB — CBC WITH DIFFERENTIAL/PLATELET
Basophils Absolute: 0 10*3/uL (ref 0.0–0.1)
Basophils Relative: 0 % (ref 0–1)
Eosinophils Absolute: 0.1 10*3/uL (ref 0.0–0.7)
Eosinophils Relative: 1 % (ref 0–5)
HCT: 38.7 % (ref 36.0–46.0)
Hemoglobin: 12.9 g/dL (ref 12.0–15.0)
LYMPHS ABS: 1.8 10*3/uL (ref 0.7–4.0)
Lymphocytes Relative: 25 % (ref 12–46)
MCH: 30.5 pg (ref 26.0–34.0)
MCHC: 33.3 g/dL (ref 30.0–36.0)
MCV: 91.5 fL (ref 78.0–100.0)
MONO ABS: 0.7 10*3/uL (ref 0.1–1.0)
Monocytes Relative: 10 % (ref 3–12)
Neutro Abs: 4.5 10*3/uL (ref 1.7–7.7)
Neutrophils Relative %: 64 % (ref 43–77)
Platelets: 233 10*3/uL (ref 150–400)
RBC: 4.23 MIL/uL (ref 3.87–5.11)
RDW: 13 % (ref 11.5–15.5)
WBC: 7 10*3/uL (ref 4.0–10.5)

## 2014-01-14 LAB — COMPREHENSIVE METABOLIC PANEL
ALT: 13 U/L (ref 0–35)
AST: 23 U/L (ref 0–37)
Albumin: 3.9 g/dL (ref 3.5–5.2)
Alkaline Phosphatase: 56 U/L (ref 39–117)
Anion gap: 14 (ref 5–15)
BILIRUBIN TOTAL: 0.4 mg/dL (ref 0.3–1.2)
BUN: 24 mg/dL — ABNORMAL HIGH (ref 6–23)
CALCIUM: 9.9 mg/dL (ref 8.4–10.5)
CO2: 25 meq/L (ref 19–32)
Chloride: 95 mEq/L — ABNORMAL LOW (ref 96–112)
Creatinine, Ser: 1 mg/dL (ref 0.50–1.10)
GFR calc Af Amer: 59 mL/min — ABNORMAL LOW (ref >90)
GFR, EST NON AFRICAN AMERICAN: 51 mL/min — AB (ref >90)
GLUCOSE: 114 mg/dL — AB (ref 70–99)
Potassium: 4.1 mEq/L (ref 3.7–5.3)
SODIUM: 134 meq/L — AB (ref 137–147)
Total Protein: 8.1 g/dL (ref 6.0–8.3)

## 2014-01-14 LAB — URINALYSIS, ROUTINE W REFLEX MICROSCOPIC
BILIRUBIN URINE: NEGATIVE
GLUCOSE, UA: NEGATIVE mg/dL
HGB URINE DIPSTICK: NEGATIVE
KETONES UR: NEGATIVE mg/dL
Leukocytes, UA: NEGATIVE
Nitrite: NEGATIVE
PH: 8 (ref 5.0–8.0)
Protein, ur: NEGATIVE mg/dL
Specific Gravity, Urine: 1.016 (ref 1.005–1.030)
Urobilinogen, UA: 0.2 mg/dL (ref 0.0–1.0)

## 2014-01-14 LAB — LIPASE, BLOOD: Lipase: 32 U/L (ref 11–59)

## 2014-01-14 LAB — GLUCOSE, CAPILLARY: GLUCOSE-CAPILLARY: 123 mg/dL — AB (ref 70–99)

## 2014-01-14 MED ORDER — MORPHINE SULFATE 4 MG/ML IJ SOLN
4.0000 mg | Freq: Once | INTRAMUSCULAR | Status: AC
  Administered 2014-01-14: 4 mg via INTRAVENOUS
  Filled 2014-01-14: qty 1

## 2014-01-14 MED ORDER — LISINOPRIL 2.5 MG PO TABS
2.5000 mg | ORAL_TABLET | Freq: Every morning | ORAL | Status: DC
  Administered 2014-01-15 – 2014-01-18 (×4): 2.5 mg via ORAL
  Filled 2014-01-14 (×6): qty 1

## 2014-01-14 MED ORDER — PROMETHAZINE HCL 25 MG PO TABS: 12.5000 mg | ORAL_TABLET | Freq: Four times a day (QID) | ORAL | Status: DC | PRN

## 2014-01-14 MED ORDER — INSULIN ASPART 100 UNIT/ML ~~LOC~~ SOLN
0.0000 [IU] | Freq: Three times a day (TID) | SUBCUTANEOUS | Status: DC
  Administered 2014-01-15: 1 [IU] via SUBCUTANEOUS
  Administered 2014-01-15: 2 [IU] via SUBCUTANEOUS
  Administered 2014-01-16 – 2014-01-19 (×5): 1 [IU] via SUBCUTANEOUS
  Administered 2014-01-21: 3 [IU] via SUBCUTANEOUS
  Administered 2014-01-21: 2 [IU] via SUBCUTANEOUS
  Administered 2014-01-23 – 2014-01-25 (×3): 1 [IU] via SUBCUTANEOUS
  Administered 2014-01-28 – 2014-01-29 (×3): 2 [IU] via SUBCUTANEOUS
  Administered 2014-01-30: 3 [IU] via SUBCUTANEOUS
  Administered 2014-01-30: 2 [IU] via SUBCUTANEOUS
  Administered 2014-01-30 – 2014-01-31 (×3): 1 [IU] via SUBCUTANEOUS
  Administered 2014-01-31: 2 [IU] via SUBCUTANEOUS
  Administered 2014-02-01: 1 [IU] via SUBCUTANEOUS
  Administered 2014-02-01: 2 [IU] via SUBCUTANEOUS

## 2014-01-14 MED ORDER — HYDROMORPHONE HCL PF 1 MG/ML IJ SOLN
1.0000 mg | INTRAMUSCULAR | Status: DC | PRN
  Administered 2014-01-15 – 2014-01-20 (×11): 1 mg via INTRAVENOUS
  Filled 2014-01-14 (×11): qty 1

## 2014-01-14 MED ORDER — SODIUM CHLORIDE 0.9 % IV SOLN
INTRAVENOUS | Status: AC
  Administered 2014-01-14: 20:00:00 via INTRAVENOUS

## 2014-01-14 MED ORDER — FLEET ENEMA 7-19 GM/118ML RE ENEM
1.0000 | ENEMA | Freq: Once | RECTAL | Status: AC
  Administered 2014-01-15: 1 via RECTAL
  Filled 2014-01-14: qty 1

## 2014-01-14 MED ORDER — TRIAMTERENE-HCTZ 75-50 MG PO TABS
1.0000 | ORAL_TABLET | Freq: Every morning | ORAL | Status: DC
  Administered 2014-01-15 – 2014-01-18 (×4): 1 via ORAL
  Filled 2014-01-14 (×6): qty 1

## 2014-01-14 MED ORDER — ENOXAPARIN SODIUM 40 MG/0.4ML ~~LOC~~ SOLN
40.0000 mg | SUBCUTANEOUS | Status: DC
  Administered 2014-01-14 – 2014-01-18 (×5): 40 mg via SUBCUTANEOUS
  Filled 2014-01-14 (×6): qty 0.4

## 2014-01-14 MED ORDER — ONDANSETRON HCL 4 MG/2ML IJ SOLN: 4.0000 mg | Freq: Four times a day (QID) | INTRAMUSCULAR | Status: DC | PRN

## 2014-01-14 MED ORDER — PROMETHAZINE HCL 25 MG PO TABS
12.5000 mg | ORAL_TABLET | Freq: Four times a day (QID) | ORAL | Status: DC | PRN
  Administered 2014-01-24: 12.5 mg via ORAL
  Administered 2014-01-25: 25 mg via ORAL
  Filled 2014-01-14 (×2): qty 1

## 2014-01-14 MED ORDER — IOHEXOL 300 MG/ML  SOLN
50.0000 mL | Freq: Once | INTRAMUSCULAR | Status: AC | PRN
  Administered 2014-01-14: 50 mL via ORAL

## 2014-01-14 MED ORDER — LORAZEPAM 2 MG/ML IJ SOLN
0.5000 mg | Freq: Every evening | INTRAMUSCULAR | Status: DC | PRN
  Administered 2014-01-14 – 2014-01-23 (×7): 0.5 mg via INTRAVENOUS
  Filled 2014-01-14 (×7): qty 1

## 2014-01-14 MED ORDER — PANTOPRAZOLE SODIUM 40 MG PO TBEC
40.0000 mg | DELAYED_RELEASE_TABLET | Freq: Every day | ORAL | Status: DC
Start: 2014-01-15 — End: 2014-02-01
  Administered 2014-01-15 – 2014-02-01 (×18): 40 mg via ORAL
  Filled 2014-01-14 (×19): qty 1

## 2014-01-14 MED ORDER — IOHEXOL 300 MG/ML  SOLN
100.0000 mL | Freq: Once | INTRAMUSCULAR | Status: AC | PRN
  Administered 2014-01-14: 100 mL via INTRAVENOUS

## 2014-01-14 MED ORDER — FLEET ENEMA 7-19 GM/118ML RE ENEM
1.0000 | ENEMA | Freq: Once | RECTAL | Status: AC
  Administered 2014-01-14: 1 via RECTAL
  Filled 2014-01-14: qty 1

## 2014-01-14 NOTE — ED Notes (Signed)
I hve just given report to [facility], RN on [address]; and will transport shortly.  Pt. Remains in no distress.

## 2014-01-14 NOTE — ED Provider Notes (Signed)
Pt left at change of shift to get results of her AP CT scan.  Pt has been having some mainly lower abdominal pain "like labor pains" that got worse over the past week. She denies nausea or vomiting has had normal appetite, but no BM for several days.   17:57 Radiologist called, pt has a 6 cm mass causing obstruction in her flex colon most likely pancreatic but could be colon cancer.   Patient had a CT of her abdomen pelvis done 16 months ago that did not show the mass. She states she was evaluated by gastroenterologist Dr. [name] and had endoscopy and colonoscopy done with removal of some benign polyps. She states at that time she was having upper abdominal pain.  18:22 Dr Doyle Askew, will admit to team 8, wants surgery to be consulted.   19:01 Dr Zella Richer will see patient.    Ct Abdomen Pelvis W Contrast  01/14/2014   CLINICAL DATA:  Abdominal pain and constipation.  EXAM: CT ABDOMEN AND PELVIS WITH CONTRAST  TECHNIQUE: Multidetector CT imaging of the abdomen and pelvis was performed using the standard protocol following bolus administration of intravenous contrast.  CONTRAST:  38mL OMNIPAQUE IOHEXOL 300 MG/ML SOLN, 125mL OMNIPAQUE IOHEXOL 300 MG/ML SOLN  COMPARISON:  08/20/2012  FINDINGS: Visualized lower chest again demonstrates changes related to prior myocardial infarction with an apical ventricular aneurysm again identified.  The liver shows steatosis. No hepatic masses or biliary obstruction identified. The gallbladder is unremarkable.  The spleen, pancreas, adrenal glands and kidneys are unremarkable.  There is significant fecal material noted with colonic distention beginning at the level of the cecum and continuing to the level of the splenic flexure. At the level of the splenic flexure, there is caliber change of the colon with decompression of the descending colon and distal colon. Amorphous, lobulated soft tissue in this region measures roughly 5.1 x 5.3 x 5.7 cm in greatest dimensions and is  very suspicious for an obstructing tumor. It is difficult to identify whether this tumor is originating from the tail of the pancreas or the colon. The lobulated soft tissue abuts the tail of the pancreas, the posterior wall of the stomach, the colon and the splenic hilum. It also nearly abuts the left kidney. Tumor origin is suspected to be most likely the pancreatic tail followed by the colon. This was not present on the prior CT in appears to be an aggressive process.  No evidence of bowel perforation or abnormal fluid collection. No enlarged lymph nodes are seen. The bladder is distended. No hernias are seen. Bony structures are unremarkable.  IMPRESSION: Left upper quadrant soft tissue mass causing obstruction of the colon at the level of the splenic flexure. The mass measures approximately 5.7 cm in greatest dimensions. Exact origin of the tumor is not entirely clear by CT. 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 Second most likely origin would be the colon. No associated colonic perforation is identified by CT. No associated enlarged lymph nodes or distant metastatic lesions are identified in the abdomen or pelvis.  Critical Value/emergent results were called by telephone at the time of interpretation on 01/14/2014 at 5:55 pm to Dr. Tomi Bamberger , who verbally acknowledged these results.   Electronically Signed   By: Aletta Edouard M.D.   On: 01/14/2014 17:58   Dg Abd Acute W/chest  01/14/2014   CLINICAL DATA:  Abdominal pain, nausea and vomiting  EXAM: ACUTE ABDOMEN SERIES (ABDOMEN 2 VIEW & CHEST  1 VIEW)  COMPARISON:  12/17/2011 similar exam  FINDINGS: Moderate enlargement of the cardiac silhouette is reidentified. The lungs are clear. No pleural effusion. Left lower lobe scarring is stable. No free air beneath the diaphragms.  Normal bowel gas pattern. No abnormal radiopacity. No acute osseous finding. Degenerative changes are noted at the symphysis pubis.   IMPRESSION: Negative abdominal radiographs.  No acute cardiopulmonary disease.   Electronically Signed   By: Conchita Paris M.D.   On: 01/14/2014 14:34    Plan admission   Diagnoses that have been ruled out:  None  Diagnoses that are still under consideration:  None  Final diagnoses:  Abdominal mass, left upper quadrant  Lower abdominal pain  Other constipation   Rolland Porter, MD, [facility] S[facility]    Janice Norrie, MD 01/14/14 1903

## 2014-01-14 NOTE — ED Provider Notes (Signed)
CSN: [id number]     Arrival date & time 01/14/14  1213 History   First MD Initiated Contact with Patient 01/14/14 1311     Chief Complaint  Patient presents with  . Abdominal Pain  . Constipation    HPI Pt noticed some pain in her abdomen on Sunday.  It was mild but throughout the week it has gotten worse.  She has had some constipation.  Last BM was a few days ago.  She tried Milk of magnesia without relief.  She also hasn't passed any gas.  No vomiting.  No fever.  Appetite has been fine.  The pain is all over her abdomen.  It feels like a cramp towards the bottom of her abdomen.    Nothing seems to make it better or worse.  The pain will come and go intermittently.  Currently she is not having any pain Past Medical History  Diagnosis Date  . Hypertension   . Diabetes mellitus   . Hypercholesteremia   . Anxiety   . MI (myocardial infarction)   . H. pylori infection   . Gastritis    Past Surgical History  Procedure Laterality Date  . Cesarean section    . Upper gastrointestinal endoscopy  09/2012    Dr. [name]   Family History  Problem Relation Age of Onset  . Diabetes Father   . Hypertension Father   . Heart failure Father    History  Substance Use Topics  . Smoking status: Never Smoker   . Smokeless tobacco: Never Used  . Alcohol Use: No   OB History   Grav Para Term Preterm Abortions TAB SAB Ect Mult Living                 Review of Systems  All other systems reviewed and are negative.     Allergies  Review of patient's allergies indicates no known allergies.  Home Medications   Prior to Admission medications   Medication Sig Start Date End Date Taking? Authorizing Provider  alendronate (FOSAMAX) 70 MG tablet Take 70 mg by mouth every 7 (seven) days. Take with a full glass of water on an empty stomach.  Taken on Sundays.   Yes Historical Provider, MD  ALPRAZolam Duanne Moron) 0.5 MG tablet Take 1 mg by mouth at bedtime. anxiety   Yes Historical Provider, MD   aspirin EC 325 MG tablet Take 325 mg by mouth daily.   Yes Historical Provider, MD  atorvastatin (LIPITOR) 80 MG tablet Take 80 mg by mouth at bedtime.    Yes Historical Provider, MD  Calcium Carbonate-Vitamin D (OS-CAL 500 + D PO) Take 1 tablet by mouth at bedtime.    Yes Historical Provider, MD  ibuprofen (ADVIL,MOTRIN) 200 MG tablet Take 800 mg by mouth every 8 (eight) hours as needed for mild pain.    Yes Historical Provider, MD  lisinopril (PRINIVIL,ZESTRIL) 2.5 MG tablet Take 2.5 mg by mouth every morning.    Yes Historical Provider, MD  megestrol (MEGACE) 40 MG/ML suspension Take 10 mLs (400 mg total) by mouth daily. 11/23/12  Yes Jerene Bears, MD  metFORMIN (GLUCOPHAGE) 500 MG tablet Take 500 mg by mouth 2 (two) times daily with a meal. Depending on blood sugar pt might not take second dose at night (only takes second dose if blood sugar is high).   Yes Historical Provider, MD  omeprazole (PRILOSEC) 20 MG capsule Take 1 capsule (20 mg total) by mouth daily. 11/23/12  Yes Lajuan Lines Pyrtle,  MD  triamterene-hydrochlorothiazide (MAXZIDE) 75-50 MG per tablet Take 1 tablet by mouth every morning.    Yes Historical Provider, MD   BP 158/85  Pulse 79  Temp(Src) 97.8 F (36.6 C) (Oral)  Resp 16  SpO2 100% Physical Exam  Nursing note and vitals reviewed. Constitutional: She appears well-developed and well-nourished. No distress.  HENT:  Head: Normocephalic and atraumatic.  Right Ear: External ear normal.  Left Ear: External ear normal.  Eyes: Conjunctivae are normal. Right eye exhibits no discharge. Left eye exhibits no discharge. No scleral icterus.  Neck: Neck supple. No tracheal deviation present.  Cardiovascular: Normal rate, regular rhythm and intact distal pulses.   Pulmonary/Chest: Effort normal and breath sounds normal. No stridor. No respiratory distress. She has no wheezes. She has no rales.  Abdominal: Soft. Bowel sounds are normal. She exhibits no distension. There is no tenderness.  There is no rebound and no guarding.  Genitourinary:  No  Fecal impaction on rectal exam  Musculoskeletal: She exhibits no edema and no tenderness.  Neurological: She is alert. She has normal strength. No cranial nerve deficit (no facial droop, extraocular movements intact, no slurred speech) or sensory deficit. She exhibits normal muscle tone. She displays no seizure activity. Coordination normal.  Skin: Skin is warm and dry. No rash noted.  Psychiatric: She has a normal mood and affect.    ED Course  Procedures (including critical care time) Labs Review Labs Reviewed  COMPREHENSIVE METABOLIC PANEL - Abnormal; Notable for the following:    Sodium 134 (*)    Chloride 95 (*)    Glucose, Bld 114 (*)    BUN 24 (*)    GFR calc non Af Amer 51 (*)    GFR calc Af Amer 59 (*)    All other components within normal limits  CBC WITH DIFFERENTIAL  LIPASE, BLOOD  URINALYSIS, ROUTINE W REFLEX MICROSCOPIC    Imaging Review Dg Abd Acute W/chest  01/14/2014   CLINICAL DATA:  Abdominal pain, nausea and vomiting  EXAM: ACUTE ABDOMEN SERIES (ABDOMEN 2 VIEW & CHEST 1 VIEW)  COMPARISON:  12/17/2011 similar exam  FINDINGS: Moderate enlargement of the cardiac silhouette is reidentified. The lungs are clear. No pleural effusion. Left lower lobe scarring is stable. No free air beneath the diaphragms.  Normal bowel gas pattern. No abnormal radiopacity. No acute osseous finding. Degenerative changes are noted at the symphysis pubis.  IMPRESSION: Negative abdominal radiographs.  No acute cardiopulmonary disease.   Electronically Signed   By: Conchita Paris M.D.   On: 01/14/2014 14:34     MDM   No obvious source for her pain based on labs and initially xrays.  Will ct to evaluate further.  Dr Eliane Decree will follow up on results.   Dorie Rank, MD 01/14/14 [phone]

## 2014-01-14 NOTE — Consult Note (Signed)
Reason for Consult:abdominal tumor, partial large bowel obstruction Referring Physician: Dr. Epimenio Foot Karen Arias is an 80 y.o. female.  HPI: she's been having some cramping abdominal pain on and off for 2 weeks. It got worse on Sunday and was worse last night then it was on Sunday. She presented to the emergency room and was evaluated. Plain abdominal x-rays did not demonstrate any signs of obstruction. A CT scan demonstrated a tumor in the left upper quadrant which appeared to be compressing the colon at the splenic flexure. Stool was noted be in the colon proximal to the splenic flexure. The colon past the splenic flexure was collapsed. The tumor appears to involve the tail of the pancreas, spleen, left kidney, and the colon and possibly part of the stomach. The 2 was not there on a CT scan done 17 months ago. She has been eating without any nausea or vomiting. She describes the pain as similar to labor pains at times.  She tried some milk of magnesia but only passed gas after it. She doesn't think she's had a bowel movement this week.  No nausea or vomiting  Past Medical History  Diagnosis Date  . Hypertension   . Diabetes mellitus   . Hypercholesteremia   . Anxiety   . MI (myocardial infarction)   . H. pylori infection   . Gastritis     Past Surgical History  Procedure Laterality Date  . Cesarean section    . Upper gastrointestinal endoscopy  09/2012    Dr. [name]    Family History  Problem Relation Age of Onset  . Diabetes Father   . Hypertension Father   . Heart failure Father     Social History:  reports that she has never smoked. She has never used smokeless tobacco. She reports that she does not drink alcohol or use illicit drugs.  Allergies: No Known Allergies  Prior to Admission medications   Medication Sig Start Date End Date Taking? Authorizing Provider  alendronate (FOSAMAX) 70 MG tablet Take 70 mg by mouth every 7 (seven) days. Take with a full glass of water  on an empty stomach.  Taken on Sundays.   Yes Historical Provider, MD  ALPRAZolam Duanne Moron) 0.5 MG tablet Take 1 mg by mouth at bedtime. anxiety   Yes Historical Provider, MD  aspirin EC 325 MG tablet Take 325 mg by mouth daily.   Yes Historical Provider, MD  atorvastatin (LIPITOR) 80 MG tablet Take 80 mg by mouth at bedtime.    Yes Historical Provider, MD  Calcium Carbonate-Vitamin D (OS-CAL 500 + D PO) Take 1 tablet by mouth at bedtime.    Yes Historical Provider, MD  ibuprofen (ADVIL,MOTRIN) 200 MG tablet Take 800 mg by mouth every 8 (eight) hours as needed for mild pain.    Yes Historical Provider, MD  lisinopril (PRINIVIL,ZESTRIL) 2.5 MG tablet Take 2.5 mg by mouth every morning.    Yes Historical Provider, MD  megestrol (MEGACE) 40 MG/ML suspension Take 10 mLs (400 mg total) by mouth daily. 11/23/12  Yes Jerene Bears, MD  metFORMIN (GLUCOPHAGE) 500 MG tablet Take 500 mg by mouth 2 (two) times daily with a meal. Depending on blood sugar pt might not take second dose at night (only takes second dose if blood sugar is high).   Yes Historical Provider, MD  omeprazole (PRILOSEC) 20 MG capsule Take 1 capsule (20 mg total) by mouth daily. 11/23/12  Yes Jerene Bears, MD  triamterene-hydrochlorothiazide (MAXZIDE) 75-50 MG  per tablet Take 1 tablet by mouth every morning.    Yes Historical Provider, MD     Results for orders placed during the hospital encounter of 01/14/14 (from the past 48 hour(s))  URINALYSIS, ROUTINE W REFLEX MICROSCOPIC     Status: None   Collection Time    01/14/14  2:00 PM      Result Value Ref Range   Color, Urine YELLOW  YELLOW   APPearance CLEAR  CLEAR   Specific Gravity, Urine 1.016  1.005 - 1.030   pH 8.0  5.0 - 8.0   Glucose, UA NEGATIVE  NEGATIVE mg/dL   Hgb urine dipstick NEGATIVE  NEGATIVE   Bilirubin Urine NEGATIVE  NEGATIVE   Ketones, ur NEGATIVE  NEGATIVE mg/dL   Protein, ur NEGATIVE  NEGATIVE mg/dL   Urobilinogen, UA 0.2  0.0 - 1.0 mg/dL   Nitrite NEGATIVE   NEGATIVE   Leukocytes, UA NEGATIVE  NEGATIVE   Comment: MICROSCOPIC NOT DONE ON URINES WITH NEGATIVE PROTEIN, BLOOD, LEUKOCYTES, NITRITE, OR GLUCOSE <1000 mg/dL.  CBC WITH DIFFERENTIAL     Status: None   Collection Time    01/14/14  2:02 PM      Result Value Ref Range   WBC 7.0  4.0 - 10.5 K/uL   RBC 4.23  3.87 - 5.11 MIL/uL   Hemoglobin 12.9  12.0 - 15.0 g/dL   HCT 38.7  36.0 - 46.0 %   MCV 91.5  78.0 - 100.0 fL   MCH 30.5  26.0 - 34.0 pg   MCHC 33.3  30.0 - 36.0 g/dL   RDW 13.0  11.5 - 15.5 %   Platelets 233  150 - 400 K/uL   Neutrophils Relative % 64  43 - 77 %   Neutro Abs 4.5  1.7 - 7.7 K/uL   Lymphocytes Relative 25  12 - 46 %   Lymphs Abs 1.8  0.7 - 4.0 K/uL   Monocytes Relative 10  3 - 12 %   Monocytes Absolute 0.7  0.1 - 1.0 K/uL   Eosinophils Relative 1  0 - 5 %   Eosinophils Absolute 0.1  0.0 - 0.7 K/uL   Basophils Relative 0  0 - 1 %   Basophils Absolute 0.0  0.0 - 0.1 K/uL  COMPREHENSIVE METABOLIC PANEL     Status: Abnormal   Collection Time    01/14/14  2:02 PM      Result Value Ref Range   Sodium 134 (*) 137 - 147 mEq/L   Potassium 4.1  3.7 - 5.3 mEq/L   Chloride 95 (*) 96 - 112 mEq/L   CO2 25  19 - 32 mEq/L   Glucose, Bld 114 (*) 70 - 99 mg/dL   BUN 24 (*) 6 - 23 mg/dL   Creatinine, Ser 1.00  0.50 - 1.10 mg/dL   Calcium 9.9  8.4 - 10.5 mg/dL   Total Protein 8.1  6.0 - 8.3 g/dL   Albumin 3.9  3.5 - 5.2 g/dL   AST 23  0 - 37 U/L   ALT 13  0 - 35 U/L   Alkaline Phosphatase 56  39 - 117 U/L   Total Bilirubin 0.4  0.3 - 1.2 mg/dL   GFR calc non Af Amer 51 (*) >90 mL/min   GFR calc Af Amer 59 (*) >90 mL/min   Comment: (NOTE)     The eGFR has been calculated using the CKD EPI equation.     This calculation has not been  validated in all clinical situations.     eGFR's persistently <90 mL/min signify possible Chronic Kidney     Disease.   Anion gap 14  5 - 15  LIPASE, BLOOD     Status: None   Collection Time    01/14/14  2:02 PM      Result Value Ref  Range   Lipase 32  11 - 59 U/L  GLUCOSE, CAPILLARY     Status: Abnormal   Collection Time    01/14/14  9:14 PM      Result Value Ref Range   Glucose-Capillary 123 (*) 70 - 99 mg/dL   Comment 1 Notify RN      Ct Abdomen Pelvis W Contrast  01/14/2014   CLINICAL DATA:  Abdominal pain and constipation.  EXAM: CT ABDOMEN AND PELVIS WITH CONTRAST  TECHNIQUE: Multidetector CT imaging of the abdomen and pelvis was performed using the standard protocol following bolus administration of intravenous contrast.  CONTRAST:  54m OMNIPAQUE IOHEXOL 300 MG/ML SOLN, 1062mOMNIPAQUE IOHEXOL 300 MG/ML SOLN  COMPARISON:  08/20/2012  FINDINGS: Visualized lower chest again demonstrates changes related to prior myocardial infarction with an apical ventricular aneurysm again identified.  The liver shows steatosis. No hepatic masses or biliary obstruction identified. The gallbladder is unremarkable.  The spleen, pancreas, adrenal glands and kidneys are unremarkable.  There is significant fecal material noted with colonic distention beginning at the level of the cecum and continuing to the level of the splenic flexure. At the level of the splenic flexure, there is caliber change of the colon with decompression of the descending colon and distal colon. Amorphous, lobulated soft tissue in this region measures roughly 5.1 x 5.3 x 5.7 cm in greatest dimensions and is very suspicious for an obstructing tumor. It is difficult to identify whether this tumor is originating from the tail of the pancreas or the colon. The lobulated soft tissue abuts the tail of the pancreas, the posterior wall of the stomach, the colon and the splenic hilum. It also nearly abuts the left kidney. Tumor origin is suspected to be most likely the pancreatic tail followed by the colon. This was not present on the prior CT in appears to be an aggressive process.  No evidence of bowel perforation or abnormal fluid collection. No enlarged lymph nodes are seen. The  bladder is distended. No hernias are seen. Bony structures are unremarkable.  IMPRESSION: Left upper quadrant soft tissue mass causing obstruction of the colon at the level of the splenic flexure. The mass measures approximately 5.7 cm in greatest dimensions. Exact origin of the tumor is not entirely clear by CT. 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 Second most likely origin would be the colon. No associated colonic perforation is identified by CT. No associated enlarged lymph nodes or distant metastatic lesions are identified in the abdomen or pelvis.  Critical Value/emergent results were called by telephone at the time of interpretation on 01/14/2014 at 5:55 pm to Dr. KNTomi Bamberger who verbally acknowledged these results.   Electronically Signed   By: GlAletta Edouard.D.   On: 01/14/2014 17:58   Dg Abd Acute W/chest  01/14/2014   CLINICAL DATA:  Abdominal pain, nausea and vomiting  EXAM: ACUTE ABDOMEN SERIES (ABDOMEN 2 VIEW & CHEST 1 VIEW)  COMPARISON:  12/17/2011 similar exam  FINDINGS: Moderate enlargement of the cardiac silhouette is reidentified. The lungs are clear. No pleural effusion. Left lower lobe scarring is stable. No  free air beneath the diaphragms.  Normal bowel gas pattern. No abnormal radiopacity. No acute osseous finding. Degenerative changes are noted at the symphysis pubis.  IMPRESSION: Negative abdominal radiographs.  No acute cardiopulmonary disease.   Electronically Signed   By: Conchita Paris M.D.   On: 01/14/2014 14:34    Review of Systems  Constitutional: Negative for fever, chills, weight loss and malaise/fatigue.  Respiratory: Negative for shortness of breath.   Cardiovascular: Negative for chest pain.  Gastrointestinal: Positive for abdominal pain and constipation. Negative for nausea and vomiting.  Genitourinary: Negative for dysuria and hematuria.  Musculoskeletal: Negative for back pain.  Neurological: Negative for seizures and  headaches.  Endo/Heme/Allergies: Does not bruise/bleed easily.   Blood pressure 141/63, pulse 82, temperature 97.9 F (36.6 C), temperature source Oral, resp. rate 18, height _0  (1.575 m), weight 129 lb 6.6 oz (58.7 kg), SpO2 100.00%. Physical Exam  Constitutional: She appears well-developed and well-nourished. No distress.  HENT:  Head: Normocephalic and atraumatic.  Eyes: EOM are normal. No scleral icterus.  Neck: Neck supple.  Cardiovascular: Normal rate and regular rhythm.   Respiratory: Effort normal and breath sounds normal.  GI: Soft. Bowel sounds are normal. She exhibits distension. She exhibits no mass. There is no tenderness. There is no rebound and no guarding.  Musculoskeletal: She exhibits no edema.  Lymphadenopathy:    She has no cervical adenopathy.  Neurological: She is alert.  Skin: Skin is warm and dry.  Psychiatric: She has a normal mood and affect. Her behavior is normal.    Assessment/Plan: Left upper quadrant tumor may be emanating from the pancreas or colon. It appears to be extrinsically compressing the colon leading to partial or complete obstruction although she is not significantly dilated proximal to this. The tumor appears to involve the left kidney as was the spleen and possibly the stomach. Her abdomen is nontender and she's not having any nausea or vomiting.  Recommendation: Check tumor markers. Give enemas. Repeat x-rays in the morning. May need to get GI involve to try to do a colonoscopy. The surgical procedure would be quite extensive and she understands that. I told her we would discuss this with her more if it came to that.  Luismanuel Corman J 01/14/2014, 9:26 PM

## 2014-01-14 NOTE — ED Notes (Signed)
Pt c/o abdominal pain, sts was diagnosed with polyps on colon, but told it was non-malignant. Pt also sts pain is getting worse when attempting bowel movement. She sts LBM 3-4 days ago, took milk of magnesia last night no result.

## 2014-01-14 NOTE — H&P (Addendum)
[facility] History and Physical  Karen Arias QBH:[id number] DOB: 1929-02-10 DOA: 01/14/2014  Referring physician: ED physician PCP: [name], MD   Chief Complaint: abd pain and constipation   HPI:  Pt is 80 yo pleasant female, looks younger than her age, presented to [facility] ED with main concern of several days duration of upper abdominal quadrants pain, more on the left side, intermittent and throbbing, 7/10 in severity when present, with no specific alleviating factors, no known aggravating factors. Pt reports associated constipation and has trued milk of magnesia but this has not helped. She reports she has had intermittent episodes of the abd discomfort over the past several years but it has really not bothered her much. This time it seems much worse and is not going away. She denies fevers, chills, no urinary concerns, no recent changes in medications. Denies changes in appetite or weight.   In ED, pt noted to be hemodynamically stable, CT abd notable for LUQ soft tissue mass (5.7 cm in diameter) causing obstruction of the colon at the level of the splenic flexure, worrisome for malignancy but origin unclear. [facility] asked to admit to medical floor fr further evaluation and surgery team has been consulted.    Assessment and Plan: Active Problems: Abdominal pain with constipation, obstruction - worrisome for malignancy - admit to medical floor - provide IVF, analgesia and antiemetics as needed - appreciate surgery team input, will follow upon recommendations  HTN - stable BP on admission - continue home medications: Maxzide and Lisinopril  Diabetes mellitus  - place on SSI for now  - will hold Metformin for now HLD - hold statin for now   Lovenox for DVT prophylaxis  Radiological Exams on Admission: Ct Abdomen Pelvis W Contrast  01/14/2014   Left upper quadrant soft tissue mass causing obstruction of the colon at the level of the splenic flexure. The mass  measures approximately 5.7 cm in greatest dimensions. Exact origin of the tumor is not entirely clear by CT. 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 Second most likely origin would be the colon. No associated colonic perforation is identified by CT. No associated enlarged lymph nodes or distant metastatic lesions are identified in the abdomen or pelvis.    Dg Abd Acute W/chest  01/14/2014    Negative abdominal radiographs.  No acute cardiopulmonary disease.     Code Status: Full Family Communication: Pt at bedside Disposition Plan: Admit for further evaluation    Review of Systems:  Constitutional: Negative for diaphoresis.  HENT: Negative for hearing loss, ear pain, nosebleeds, congestion, sore throat, neck pain, tinnitus and ear discharge.   Eyes: Negative for blurred vision, double vision, photophobia, pain, discharge and redness.  Respiratory: Negative for cough, hemoptysis, sputum production, shortness of breath, wheezing and stridor.   Cardiovascular: Negative for chest pain, palpitations, orthopnea, claudication and leg swelling.  Gastrointestinal: Per HPI Genitourinary: Negative for dysuria, urgency, frequency, hematuria and flank pain.  Musculoskeletal: Negative for myalgias, back pain, joint pain and falls.  Skin: Negative for itching and rash.  Neurological: Negative for dizziness and weakness.  Endo/Heme/Allergies: Negative for environmental allergies and polydipsia. Does not bruise/bleed easily.  Psychiatric/Behavioral: Negative for suicidal ideas. The patient is not nervous/anxious.      Past Medical History  Diagnosis Date  . Hypertension   . Diabetes mellitus   . Hypercholesteremia   . Anxiety   . MI (myocardial infarction)   . H. pylori infection   .  Gastritis     Past Surgical History  Procedure Laterality Date  . Cesarean section    . Upper gastrointestinal endoscopy  09/2012    Dr. [name]    Social History:   reports that she has never smoked. She has never used smokeless tobacco. She reports that she does not drink alcohol or use illicit drugs.  No Known Allergies  Family History  Problem Relation Age of Onset  . Diabetes Father   . Hypertension Father   . Heart failure Father     Prior to Admission medications   Medication Sig Start Date End Date Taking? Authorizing Provider  alendronate (FOSAMAX) 70 MG tablet Take 70 mg by mouth every 7 (seven) days. Take with a full glass of water on an empty stomach.  Taken on Sundays.   Yes Historical Provider, MD  ALPRAZolam Duanne Moron) 0.5 MG tablet Take 1 mg by mouth at bedtime. anxiety   Yes Historical Provider, MD  aspirin EC 325 MG tablet Take 325 mg by mouth daily.   Yes Historical Provider, MD  atorvastatin (LIPITOR) 80 MG tablet Take 80 mg by mouth at bedtime.    Yes Historical Provider, MD  Calcium Carbonate-Vitamin D (OS-CAL 500 + D PO) Take 1 tablet by mouth at bedtime.    Yes Historical Provider, MD  ibuprofen (ADVIL,MOTRIN) 200 MG tablet Take 800 mg by mouth every 8 (eight) hours as needed for mild pain.    Yes Historical Provider, MD  lisinopril (PRINIVIL,ZESTRIL) 2.5 MG tablet Take 2.5 mg by mouth every morning.    Yes Historical Provider, MD  megestrol (MEGACE) 40 MG/ML suspension Take 10 mLs (400 mg total) by mouth daily. 11/23/12  Yes Jerene Bears, MD  metFORMIN (GLUCOPHAGE) 500 MG tablet Take 500 mg by mouth 2 (two) times daily with a meal. Depending on blood sugar pt might not take second dose at night (only takes second dose if blood sugar is high).   Yes Historical Provider, MD  omeprazole (PRILOSEC) 20 MG capsule Take 1 capsule (20 mg total) by mouth daily. 11/23/12  Yes Jerene Bears, MD  triamterene-hydrochlorothiazide (MAXZIDE) 75-50 MG per tablet Take 1 tablet by mouth every morning.    Yes Historical Provider, MD    Physical Exam: Filed Vitals:   01/14/14 1255 01/14/14 1521 01/14/14 1649  BP: 154/80 158/85 175/84  Pulse: 90 79 78   Temp: 97.9 F (36.6 C) 97.8 F (36.6 C)   TempSrc:  Oral   Resp: 18 16 16   SpO2: 99% 100% 98%    Physical Exam  Constitutional: Appears well-developed and well-nourished. No distress.  HENT: Normocephalic. External right and left ear normal. Dry MM Eyes: Conjunctivae and EOM are normal. PERRLA, no scleral icterus.  Neck: Normal ROM. Neck supple. No JVD. No tracheal deviation. No thyromegaly.  CVS: RRR, S1/S2 +, no murmurs, no gallops, no carotid bruit.  Pulmonary: Effort and breath sounds normal, no stridor, rhonchi, wheezes, rales.  Abdominal: Soft. Slight distension, tenderness in upper abd quads L > R, no rebound or guarding.  Musculoskeletal: Normal range of motion.   Lymphadenopathy: No lymphadenopathy noted, cervical, inguinal. Neuro: Alert. Normal reflexes, muscle tone coordination. No cranial nerve deficit. Skin: Skin is warm and dry. No rash noted. Not diaphoretic. No erythema. No pallor.  Psychiatric: Normal mood and affect. Behavior, judgment, thought content normal.   Labs on Admission:  Basic Metabolic Panel:  Recent Labs Lab 01/14/14 1402  NA 134*  K 4.1  CL 95*  CO2 25  GLUCOSE 114*  BUN 24*  CREATININE 1.00  CALCIUM 9.9   Liver Function Tests:  Recent Labs Lab 01/14/14 1402  AST 23  ALT 13  ALKPHOS 56  BILITOT 0.4  PROT 8.1  ALBUMIN 3.9    Recent Labs Lab 01/14/14 1402  LIPASE 32   CBC:  Recent Labs Lab 01/14/14 1402  WBC 7.0  NEUTROABS 4.5  HGB 12.9  HCT 38.7  MCV 91.5  PLT 233   EKG: Normal sinus rhythm, no ST/T wave changes  Faye Ramsay, MD  [facility] Pager [phone]  If 7PM-7AM, please contact night-coverage [email or web address] Password [facility] 01/14/2014, 6:35 PM

## 2014-01-15 ENCOUNTER — Inpatient Hospital Stay (HOSPITAL_COMMUNITY): Payer: Medicare HMO

## 2014-01-15 DIAGNOSIS — R1012 Left upper quadrant pain: Secondary | ICD-10-CM

## 2014-01-15 DIAGNOSIS — I10 Essential (primary) hypertension: Secondary | ICD-10-CM

## 2014-01-15 DIAGNOSIS — K56609 Unspecified intestinal obstruction, unspecified as to partial versus complete obstruction: Secondary | ICD-10-CM | POA: Diagnosis present

## 2014-01-15 LAB — CBC
HCT: 37.2 % (ref 36.0–46.0)
Hemoglobin: 12 g/dL (ref 12.0–15.0)
MCH: 29.3 pg (ref 26.0–34.0)
MCHC: 32.3 g/dL (ref 30.0–36.0)
MCV: 90.7 fL (ref 78.0–100.0)
PLATELETS: 225 10*3/uL (ref 150–400)
RBC: 4.1 MIL/uL (ref 3.87–5.11)
RDW: 12.8 % (ref 11.5–15.5)
WBC: 6.4 10*3/uL (ref 4.0–10.5)

## 2014-01-15 LAB — BASIC METABOLIC PANEL
Anion gap: 17 — ABNORMAL HIGH (ref 5–15)
BUN: 19 mg/dL (ref 6–23)
CALCIUM: 9.3 mg/dL (ref 8.4–10.5)
CO2: 21 mEq/L (ref 19–32)
Chloride: 93 mEq/L — ABNORMAL LOW (ref 96–112)
Creatinine, Ser: 0.85 mg/dL (ref 0.50–1.10)
GFR calc Af Amer: 71 mL/min — ABNORMAL LOW (ref >90)
GFR, EST NON AFRICAN AMERICAN: 62 mL/min — AB (ref >90)
GLUCOSE: 148 mg/dL — AB (ref 70–99)
POTASSIUM: 3.6 meq/L — AB (ref 3.7–5.3)
SODIUM: 131 meq/L — AB (ref 137–147)

## 2014-01-15 LAB — GLUCOSE, CAPILLARY
GLUCOSE-CAPILLARY: 146 mg/dL — AB (ref 70–99)
Glucose-Capillary: 117 mg/dL — ABNORMAL HIGH (ref 70–99)
Glucose-Capillary: 171 mg/dL — ABNORMAL HIGH (ref 70–99)
Glucose-Capillary: 107 mg/dL — ABNORMAL HIGH (ref 70–99)

## 2014-01-15 LAB — CANCER ANTIGEN 19-9: CA 19-9: 3433.9 U/mL — ABNORMAL HIGH (ref <35.0)

## 2014-01-15 LAB — CEA: CEA: 4.9 ng/mL (ref 0.0–5.0)

## 2014-01-15 MED ORDER — ONDANSETRON HCL 4 MG/2ML IJ SOLN
4.0000 mg | Freq: Four times a day (QID) | INTRAMUSCULAR | Status: DC | PRN
  Administered 2014-01-18 – 2014-01-23 (×5): 4 mg via INTRAVENOUS
  Filled 2014-01-15 (×5): qty 2

## 2014-01-15 MED ORDER — PROMETHAZINE HCL 25 MG/ML IJ SOLN
6.2500 mg | Freq: Four times a day (QID) | INTRAMUSCULAR | Status: DC | PRN
  Administered 2014-01-15 – 2014-01-16 (×2): 6.25 mg via INTRAVENOUS
  Filled 2014-01-15 (×2): qty 1

## 2014-01-15 MED ORDER — FLEET ENEMA 7-19 GM/118ML RE ENEM
1.0000 | ENEMA | Freq: Once | RECTAL | Status: AC
  Administered 2014-01-15: 1 via RECTAL
  Filled 2014-01-15: qty 1

## 2014-01-15 NOTE — Progress Notes (Signed)
Administered one fleet enema at 2244 on 9/4. Pt was able to retain for two minutes. Pt made three to four small sized formed stools. Administered one fleet enema 0603 on 9/5. Pt was able to retain for five minutes resulting in zero stools.

## 2014-01-15 NOTE — Progress Notes (Signed)
Patient ID: Karen Arias, female   DOB: 1929/05/10, 80 y.o.   MRN: [id number] [facility] PROGRESS NOTE  Karen Arias IFO:[id number] DOB: November 10, 1928 DOA: 01/14/2014 PCP: [name], MD  Brief narrative: 80 yo pleasant female with history of hypertension, hiatal hernia (EGD in 09/2012) who followed with Dr. [name] for GI related issues. This time she presented with abdominal pain and constipation. She was found to have LUQ soft tissue mass (5.7 cm in diameter) on CT abdomen causing obstruction of the colon at the level of the splenic flexure, worrisome for malignancy but origin unclear.  Assessment/Plan:    Principal Problem: Left upper quadrant mass / colonic obstruction worrisome for malignancy  Mass worrisome for malignancy. Appreciate surgery recommendations.   GI consulted for possible need for colonoscopy.  Awaiting tumor markers results  NPO for now except for meds  Abd x ray this am showed no obstruciton  Active Problems:  HTN   Continue home medications: Maxzide and Lisinopril  Diabetes mellitus   Continue SSI for now DVT Prophylaxis   Lovenox sub Q  Code Status: Full.  Family Communication:  plan of care discussed with the patient Disposition Plan: Home when stable.    IV Access:   Peripheral IV Procedures and diagnostic studies:    Ct Abdomen Pelvis W Contrast  01/14/2014  Left upper quadrant soft tissue mass causing obstruction of the colon at the level of the splenic flexure. The mass measures approximately 5.7 cm in greatest dimensions. Exact origin of the tumor is not entirely clear by CT. 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 Second most likely origin would be the colon. No associated colonic perforation is identified by CT. No associated enlarged lymph nodes or distant metastatic lesions are identified in the abdomen or pelvis.   Dg Abd 2 Views  01/15/2014  : No evidence of small bowel  obstruction or free air.     Dg Abd Acute W/chest  01/14/2014  Negative abdominal radiographs.  No acute cardiopulmonary disease.     Medical Consultants:   Surgery Gastroenterology  Other Consultants:   None  Anti-Infectives:   None    [name], MD  [facility] Pager [phone]  If 7PM-7AM, please contact night-coverage [email or web address] Password [email or web address] 01/15/2014, 12:14 PM   LOS: 1 day    HPI/Subjective: No acute overnight events.  Objective: Filed Vitals:   01/14/14 1649 01/14/14 2005 01/15/14 0448 01/15/14 0500  BP: 175/84 141/63 147/82   Pulse: 78 82 76   Temp:  97.9 F (36.6 C) 98.4 F (36.9 C)   TempSrc:  Oral Oral   Resp: 16 18 16    Height:  5\' 2"  (1.575 m)    Weight:  58.7 kg (129 lb 6.6 oz)  58.741 kg (129 lb 8 oz)  SpO2: 98% 100% 100%     Intake/Output Summary (Last 24 hours) at 01/15/14 1214 Last data filed at 01/15/14 0600  Gross per 24 hour  Intake    715 ml  Output   1050 ml  Net   -335 ml    Exam:   General:  Pt is alert, follows commands appropriately, not in acute distress  Cardiovascular: Regular rate and rhythm, S1/S2, no murmurs  Respiratory: Clear to auscultation bilaterally, no wheezing  Abdomen: Soft, non tender, non distended, bowel sounds present  Extremities: No edema, pulses DP and PT palpable bilaterally  Neuro: Grossly nonfocal  Data Reviewed: Basic Metabolic Panel:  Recent Labs Lab  01/14/14 1402 01/15/14 0518  NA 134* 131*  K 4.1 3.6*  CL 95* 93*  CO2 25 21  GLUCOSE 114* 148*  BUN 24* 19  CREATININE 1.00 0.85  CALCIUM 9.9 9.3   Liver Function Tests:  Recent Labs Lab 01/14/14 1402  AST 23  ALT 13  ALKPHOS 56  BILITOT 0.4  PROT 8.1  ALBUMIN 3.9    Recent Labs Lab 01/14/14 1402  LIPASE 32   No results found for this basename: AMMONIA,  in the last 168 hours CBC:  Recent Labs Lab 01/14/14 1402 01/15/14 0518  WBC 7.0 6.4  NEUTROABS 4.5  --   HGB 12.9 12.0  HCT 38.7 37.2  MCV 91.5  90.7  PLT 233 225   Cardiac Enzymes: No results found for this basename: CKTOTAL, CKMB, CKMBINDEX, TROPONINI,  in the last 168 hours BNP: No components found with this basename: POCBNP,  CBG:  Recent Labs Lab 01/14/14 2114 01/15/14 0739  GLUCAP 123* 146*    No results found for this or any previous visit (from the past 240 hour(s)).   Scheduled Meds: . enoxaparin (LOVENOX) injection  40 mg Subcutaneous Q24H  . insulin aspart  0-9 Units Subcutaneous TID WC  . lisinopril  2.5 mg Oral q morning - 10a  . pantoprazole  40 mg Oral Daily  . triamterene-hydrochlorothiazide  1 tablet Oral q morning - 10a   Continuous Infusions:

## 2014-01-15 NOTE — Progress Notes (Signed)
Subjective: Had two BMs.  Has occasional pain.    Objective: Vital signs in last 24 hours: Temp:  [97.8 F (36.6 C)-98.4 F (36.9 C)] 98.4 F (36.9 C) (09/05 0448) Pulse Rate:  [76-82] 76 (09/05 0448) Resp:  [16-18] 16 (09/05 0448) BP: (141-175)/(63-85) 147/82 mmHg (09/05 0448) SpO2:  [98 %-100 %] 100 % (09/05 0448) Weight:  [129 lb 6.6 oz (58.7 kg)-129 lb 8 oz (58.741 kg)] 129 lb 8 oz (58.741 kg) (09/05 0500) Last BM Date: 01/14/14  Intake/Output from previous day: 09/04 0701 - 09/05 0700 In: 715 [I.V.:715] Out: 1050 [Urine:1050] Intake/Output this shift:    PE: General- In NAD Abdomen-soft and less distended this AM, active bowel sounds, no tenderness.  Lab Results:   Recent Labs  01/14/14 1402 01/15/14 0518  WBC 7.0 6.4  HGB 12.9 12.0  HCT 38.7 37.2  PLT 233 225   BMET  Recent Labs  01/14/14 1402 01/15/14 0518  NA 134* 131*  K 4.1 3.6*  CL 95* 93*  CO2 25 21  GLUCOSE 114* 148*  BUN 24* 19  CREATININE 1.00 0.85  CALCIUM 9.9 9.3   PT/INR No results found for this basename: LABPROT, INR,  in the last 72 hours Comprehensive Metabolic Panel:    Component Value Date/Time   NA 131* 01/15/2014 0518   NA 134* 01/14/2014 1402   K 3.6* 01/15/2014 0518   K 4.1 01/14/2014 1402   CL 93* 01/15/2014 0518   CL 95* 01/14/2014 1402   CO2 21 01/15/2014 0518   CO2 25 01/14/2014 1402   BUN 19 01/15/2014 0518   BUN 24* 01/14/2014 1402   CREATININE 0.85 01/15/2014 0518   CREATININE 1.00 01/14/2014 1402   GLUCOSE 148* 01/15/2014 0518   GLUCOSE 114* 01/14/2014 1402   CALCIUM 9.3 01/15/2014 0518   CALCIUM 9.9 01/14/2014 1402   AST 23 01/14/2014 1402   AST 31 08/20/2012 0950   ALT 13 01/14/2014 1402   ALT 18 08/20/2012 0950   ALKPHOS 56 01/14/2014 1402   ALKPHOS 45 08/20/2012 0950   BILITOT 0.4 01/14/2014 1402   BILITOT 0.3 08/20/2012 0950   PROT 8.1 01/14/2014 1402   PROT 7.0 08/20/2012 0950   ALBUMIN 3.9 01/14/2014 1402   ALBUMIN 3.7 08/20/2012 0950     Studies/Results: Ct Abdomen Pelvis W  Contrast  01/14/2014   CLINICAL DATA:  Abdominal pain and constipation.  EXAM: CT ABDOMEN AND PELVIS WITH CONTRAST  TECHNIQUE: Multidetector CT imaging of the abdomen and pelvis was performed using the standard protocol following bolus administration of intravenous contrast.  CONTRAST:  53mL OMNIPAQUE IOHEXOL 300 MG/ML SOLN, 180mL OMNIPAQUE IOHEXOL 300 MG/ML SOLN  COMPARISON:  08/20/2012  FINDINGS: Visualized lower chest again demonstrates changes related to prior myocardial infarction with an apical ventricular aneurysm again identified.  The liver shows steatosis. No hepatic masses or biliary obstruction identified. The gallbladder is unremarkable.  The spleen, pancreas, adrenal glands and kidneys are unremarkable.  There is significant fecal material noted with colonic distention beginning at the level of the cecum and continuing to the level of the splenic flexure. At the level of the splenic flexure, there is caliber change of the colon with decompression of the descending colon and distal colon. Amorphous, lobulated soft tissue in this region measures roughly 5.1 x 5.3 x 5.7 cm in greatest dimensions and is very suspicious for an obstructing tumor. It is difficult to identify whether this tumor is originating from the tail of the pancreas or the colon. The  lobulated soft tissue abuts the tail of the pancreas, the posterior wall of the stomach, the colon and the splenic hilum. It also nearly abuts the left kidney. Tumor origin is suspected to be most likely the pancreatic tail followed by the colon. This was not present on the prior CT in appears to be an aggressive process.  No evidence of bowel perforation or abnormal fluid collection. No enlarged lymph nodes are seen. The bladder is distended. No hernias are seen. Bony structures are unremarkable.  IMPRESSION: Left upper quadrant soft tissue mass causing obstruction of the colon at the level of the splenic flexure. The mass measures approximately 5.7 cm in  greatest dimensions. Exact origin of the tumor is not entirely clear by CT. 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 Second most likely origin would be the colon. No associated colonic perforation is identified by CT. No associated enlarged lymph nodes or distant metastatic lesions are identified in the abdomen or pelvis.  Critical Value/emergent results were called by telephone at the time of interpretation on 01/14/2014 at 5:55 pm to Dr. Tomi Bamberger , who verbally acknowledged these results.   Electronically Signed   By: Aletta Edouard M.D.   On: 01/14/2014 17:58   Dg Abd 2 Views  01/15/2014   CLINICAL DATA:  Abdominal pain  EXAM: ABDOMEN - 2 VIEW  COMPARISON:  CT abdomen pelvis dated 01/14/2014  FINDINGS: Nonspecific but nonobstructive bowel gas pattern, without disproportionate small bowel dilatation to suggest small bowel obstruction.  Residual contrast within mildly prominent but nondilated loops of colon.  No evidence of free air under the diaphragm on the upright view.  Visualized osseous structures are within normal limits.  IMPRESSION: No evidence of small bowel obstruction or free air.   Electronically Signed   By: Julian Hy M.D.   On: 01/15/2014 08:27   Dg Abd Acute W/chest  01/14/2014   CLINICAL DATA:  Abdominal pain, nausea and vomiting  EXAM: ACUTE ABDOMEN SERIES (ABDOMEN 2 VIEW & CHEST 1 VIEW)  COMPARISON:  12/17/2011 similar exam  FINDINGS: Moderate enlargement of the cardiac silhouette is reidentified. The lungs are clear. No pleural effusion. Left lower lobe scarring is stable. No free air beneath the diaphragms.  Normal bowel gas pattern. No abnormal radiopacity. No acute osseous finding. Degenerative changes are noted at the symphysis pubis.  IMPRESSION: Negative abdominal radiographs.  No acute cardiopulmonary disease.   Electronically Signed   By: Conchita Paris M.D.   On: 01/14/2014 14:34    Anti-infectives: Anti-infectives   None       Assessment Principal Problem:   Colonic obstruction, partial due to LUQ mass-CT findings along with elevated CA 19-9 suggest a pancreatic source that could be invading into the spleen, left kidney, colon, stomach.     LOS: 1 day   Plan: Give enemas today.  Agree with GI consult and flex sig to see the severity of the obstruction.  Given the potential extent of her disease, resection of the tumor may involved removal of multiple organs.   Karen Arias 01/15/2014

## 2014-01-16 ENCOUNTER — Inpatient Hospital Stay (HOSPITAL_COMMUNITY): Payer: Medicare HMO

## 2014-01-16 DIAGNOSIS — R970 Elevated carcinoembryonic antigen [CEA]: Secondary | ICD-10-CM

## 2014-01-16 DIAGNOSIS — R630 Anorexia: Secondary | ICD-10-CM

## 2014-01-16 DIAGNOSIS — R110 Nausea: Secondary | ICD-10-CM

## 2014-01-16 DIAGNOSIS — K5900 Constipation, unspecified: Secondary | ICD-10-CM

## 2014-01-16 DIAGNOSIS — R109 Unspecified abdominal pain: Secondary | ICD-10-CM

## 2014-01-16 DIAGNOSIS — R634 Abnormal weight loss: Secondary | ICD-10-CM

## 2014-01-16 DIAGNOSIS — E119 Type 2 diabetes mellitus without complications: Secondary | ICD-10-CM

## 2014-01-16 DIAGNOSIS — K869 Disease of pancreas, unspecified: Secondary | ICD-10-CM

## 2014-01-16 LAB — GLUCOSE, CAPILLARY
Glucose-Capillary: 113 mg/dL — ABNORMAL HIGH (ref 70–99)
Glucose-Capillary: 104 mg/dL — ABNORMAL HIGH (ref 70–99)
Glucose-Capillary: 116 mg/dL — ABNORMAL HIGH (ref 70–99)
Glucose-Capillary: 126 mg/dL — ABNORMAL HIGH (ref 70–99)
Glucose-Capillary: 128 mg/dL — ABNORMAL HIGH (ref 70–99)

## 2014-01-16 MED ORDER — BOOST / RESOURCE BREEZE PO LIQD
1.0000 | Freq: Three times a day (TID) | ORAL | Status: DC
  Administered 2014-01-16 – 2014-01-20 (×2): 1 via ORAL

## 2014-01-16 MED ORDER — PEG 3350-KCL-NA BICARB-NACL 420 G PO SOLR
4000.0000 mL | Freq: Once | ORAL | Status: AC
  Administered 2014-01-16: 4000 mL via ORAL
  Filled 2014-01-16: qty 4000

## 2014-01-16 NOTE — Consult Note (Signed)
[facility] CONSULT NOTE  Patient Care Team: Lorene Dy, MD as PCP - General (Internal Medicine)  CHIEF COMPLAINTS/PURPOSE OF CONSULTATION:  Left upper quadrant mass with elevated CA 19-9, likely locally advanced pancreatic cancer HISTORY OF PRESENTING ILLNESS:  Karen Arias 80 y.o. female is here because of severe abdominal pain. This patient follows with Dr. [name] last year for stomach reflux. She had CT imaging study done last year in April 2014 which show thickening of the stomach and subtle change in the pancreas. Apparently, she had both upper and lower endoscopy done at that time. I could only find the report the upper endoscopy which showed a subtle gastropathy. Over the last 3 months, she had progressive left upper quadrant pain, loss of appetite, 10 pound weight loss and progressive constipation. Prior to admission, she was constipated for 7 days. She denies any change in the consistency of her stool. Denies melena or hematochezia. Denies any malabsorption or steatorrhea. She denies family history of cancer. MEDICAL HISTORY:  Past Medical History  Diagnosis Date  . Hypertension   . Diabetes mellitus   . Hypercholesteremia   . Anxiety   . MI (myocardial infarction)   . H. pylori infection   . Gastritis     SURGICAL HISTORY: Past Surgical History  Procedure Laterality Date  . Cesarean section    . Upper gastrointestinal endoscopy  09/2012    Dr. [name]    SOCIAL HISTORY: History   Social History  . Marital Status: Single    Spouse Name: N/A    Number of Children: N/A  . Years of Education: N/A   Occupational History  . Not on file.   Social History Main Topics  . Smoking status: Never Smoker   . Smokeless tobacco: Never Used  . Alcohol Use: No  . Drug Use: No  . Sexual Activity: Not on file   Other Topics Concern  . Not on file   Social History Narrative  . No narrative on file    FAMILY HISTORY: Family History  Problem  Relation Age of Onset  . Diabetes Father   . Hypertension Father   . Heart failure Father     ALLERGIES:  has No Known Allergies.  MEDICATIONS:  Current Facility-Administered Medications  Medication Dose Route Frequency Provider Last Rate Last Dose  . enoxaparin (LOVENOX) injection 40 mg  40 mg Subcutaneous Q24H Theodis Blaze, MD   40 mg at 01/15/14 2111  . feeding supplement (RESOURCE BREEZE) (RESOURCE BREEZE) liquid 1 Container  1 Container Oral TID BM Darrol Jump, RD      . HYDROmorphone (DILAUDID) injection 1 mg  1 mg Intravenous Q2H PRN Theodis Blaze, MD   1 mg at 01/15/14 1950  . insulin aspart (novoLOG) injection 0-9 Units  0-9 Units Subcutaneous TID WC Theodis Blaze, MD   2 Units at 01/15/14 1641  . lisinopril (PRINIVIL,ZESTRIL) tablet 2.5 mg  2.5 mg Oral q morning - 10a Theodis Blaze, MD   2.5 mg at 01/16/14 5284  . LORazepam (ATIVAN) injection 0.5 mg  0.5 mg Intravenous QHS PRN Dianne Dun, NP   0.5 mg at 01/14/14 2306  . ondansetron (ZOFRAN) injection 4 mg  4 mg Intravenous Q6H PRN Robbie Lis, MD      . pantoprazole (PROTONIX) EC tablet 40 mg  40 mg Oral Daily Theodis Blaze, MD   40 mg at 01/16/14 1024  . promethazine (PHENERGAN) injection 6.25-12.5 mg  6.25-12.5  mg Intravenous Q6H PRN Dianne Dun, NP   6.25 mg at 01/15/14 0510  . promethazine (PHENERGAN) tablet 12.5-25 mg  12.5-25 mg Oral Q6H PRN Theodis Blaze, MD      . triamterene-hydrochlorothiazide (MAXZIDE) 75-50 MG per tablet 1 tablet  1 tablet Oral q morning - 10a Theodis Blaze, MD   1 tablet at 01/16/14 8921    REVIEW OF SYSTEMS:   Constitutional: Denies fevers, chills or abnormal night sweats Eyes: Denies blurriness of vision, double vision or watery eyes Ears, nose, mouth, throat, and face: Denies mucositis or sore throat Respiratory: Denies cough, dyspnea or wheezes Cardiovascular: Denies palpitation, chest discomfort or lower extremity swelling Skin: Denies abnormal skin  rashes Lymphatics: Denies new lymphadenopathy or easy bruising Neurological:Denies numbness, tingling or new weaknesses Behavioral/Psych: Mood is stable, no new changes  All other systems were reviewed with the patient and are negative.  PHYSICAL EXAMINATION: ECOG PERFORMANCE STATUS: 1 - Symptomatic but completely ambulatory  Filed Vitals:   01/16/14 0609  BP: 150/96  Pulse: 91  Temp: 97.8 F (36.6 C)  Resp: 16   Filed Weights   01/14/14 2005 01/15/14 0500 01/16/14 0609  Weight: 129 lb 6.6 oz (58.7 kg) 129 lb 8 oz (58.741 kg) 121 lb 7.6 oz (55.1 kg)    GENERAL:alert, no distress and comfortable SKIN: skin color, texture, turgor are normal, no rashes or significant lesions EYES: normal, conjunctiva are pink and non-injected, sclera clear OROPHARYNX:no exudate, no erythema and lips, buccal mucosa, and tongue normal  NECK: supple, thyroid normal size, non-tender, without nodularity LYMPH:  no palpable lymphadenopathy in the cervical, axillary or inguinal LUNGS: clear to auscultation and percussion with normal breathing effort HEART: regular rate & rhythm and no murmurs and no lower extremity edema ABDOMEN:abdomen soft, mildly distended, with mild tenderness in the left upper quadrant. Reduced bowel sounds   Musculoskeletal:no cyanosis of digits and no clubbing  PSYCH: alert & oriented x 3 with fluent speech NEURO: no focal motor/sensory deficits  LABORATORY DATA:  I have reviewed the data as listed Lab Results  Component Value Date   WBC 6.4 01/15/2014   HGB 12.0 01/15/2014   HCT 37.2 01/15/2014   MCV 90.7 01/15/2014   PLT 225 01/15/2014    Recent Labs  01/14/14 1402 01/15/14 0518  NA 134* 131*  K 4.1 3.6*  CL 95* 93*  CO2 25 21  GLUCOSE 114* 148*  BUN 24* 19  CREATININE 1.00 0.85  CALCIUM 9.9 9.3  GFRNONAA 51* 62*  GFRAA 59* 71*  PROT 8.1  --   ALBUMIN 3.9  --   AST 23  --   ALT 13  --   ALKPHOS 56  --   BILITOT 0.4  --     RADIOGRAPHIC STUDIES: I have  personally reviewed the radiological images as listed and agreed with the findings in the report. Ct Abdomen Pelvis W Contrast  01/14/2014   CLINICAL DATA:  Abdominal pain and constipation.  EXAM: CT ABDOMEN AND PELVIS WITH CONTRAST  TECHNIQUE: Multidetector CT imaging of the abdomen and pelvis was performed using the standard protocol following bolus administration of intravenous contrast.  CONTRAST:  77mL OMNIPAQUE IOHEXOL 300 MG/ML SOLN, 149mL OMNIPAQUE IOHEXOL 300 MG/ML SOLN  COMPARISON:  08/20/2012  FINDINGS: Visualized lower chest again demonstrates changes related to prior myocardial infarction with an apical ventricular aneurysm again identified.  The liver shows steatosis. No hepatic masses or biliary obstruction identified. The gallbladder is unremarkable.  The spleen, pancreas, adrenal  glands and kidneys are unremarkable.  There is significant fecal material noted with colonic distention beginning at the level of the cecum and continuing to the level of the splenic flexure. At the level of the splenic flexure, there is caliber change of the colon with decompression of the descending colon and distal colon. Amorphous, lobulated soft tissue in this region measures roughly 5.1 x 5.3 x 5.7 cm in greatest dimensions and is very suspicious for an obstructing tumor. It is difficult to identify whether this tumor is originating from the tail of the pancreas or the colon. The lobulated soft tissue abuts the tail of the pancreas, the posterior wall of the stomach, the colon and the splenic hilum. It also nearly abuts the left kidney. Tumor origin is suspected to be most likely the pancreatic tail followed by the colon. This was not present on the prior CT in appears to be an aggressive process.  No evidence of bowel perforation or abnormal fluid collection. No enlarged lymph nodes are seen. The bladder is distended. No hernias are seen. Bony structures are unremarkable.  IMPRESSION: Left upper quadrant soft tissue  mass causing obstruction of the colon at the level of the splenic flexure. The mass measures approximately 5.7 cm in greatest dimensions. Exact origin of the tumor is not entirely clear by CT. 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 Second most likely origin would be the colon. No associated colonic perforation is identified by CT. No associated enlarged lymph nodes or distant metastatic lesions are identified in the abdomen or pelvis.  Critical Value/emergent results were called by telephone at the time of interpretation on 01/14/2014 at 5:55 pm to Dr. Tomi Bamberger , who verbally acknowledged these results.   Electronically Signed   By: Aletta Edouard M.D.   On: 01/14/2014 17:58   Dg Abd 2 Views  01/16/2014   CLINICAL DATA:  Abdominal pain and constipation. Partial colonic obstruction.  EXAM: ABDOMEN - 2 VIEW  COMPARISON:  One day prior  FINDINGS: Upright and supine views. The upright view demonstrates no free intraperitoneal air or significant air-fluid levels. The supine view demonstrates contrast within normal caliber hepatic flexure and transverse colon. Small volume sigmoid gas identified. No significant small bowel distension.  IMPRESSION: Similar bowel gas pattern with contrast within normal caliber transverse colon. No evidence of bowel obstruction.   Electronically Signed   By: Abigail Miyamoto M.D.   On: 01/16/2014 13:00   Dg Abd 2 Views  01/15/2014   CLINICAL DATA:  Abdominal pain  EXAM: ABDOMEN - 2 VIEW  COMPARISON:  CT abdomen pelvis dated 01/14/2014  FINDINGS: Nonspecific but nonobstructive bowel gas pattern, without disproportionate small bowel dilatation to suggest small bowel obstruction.  Residual contrast within mildly prominent but nondilated loops of colon.  No evidence of free air under the diaphragm on the upright view.  Visualized osseous structures are within normal limits.  IMPRESSION: No evidence of small bowel obstruction or free air.    Electronically Signed   By: Julian Hy M.D.   On: 01/15/2014 08:27   Dg Abd Acute W/chest  01/14/2014   CLINICAL DATA:  Abdominal pain, nausea and vomiting  EXAM: ACUTE ABDOMEN SERIES (ABDOMEN 2 VIEW & CHEST 1 VIEW)  COMPARISON:  12/17/2011 similar exam  FINDINGS: Moderate enlargement of the cardiac silhouette is reidentified. The lungs are clear. No pleural effusion. Left lower lobe scarring is stable. No free air beneath the diaphragms.  Normal bowel gas pattern. No  abnormal radiopacity. No acute osseous finding. Degenerative changes are noted at the symphysis pubis.  IMPRESSION: Negative abdominal radiographs.  No acute cardiopulmonary disease.   Electronically Signed   By: Conchita Paris M.D.   On: 01/14/2014 14:34    ASSESSMENT & PLAN:  #1 abdominal pain, nausea and constipation, consistent with bowel obstruction #2 pancreatic mass extending to the colon with elevated CA 19-9, likely locally advanced pancreatic cancer until proven otherwise #3 anorexia with recent weight loss Her pain appears to be under control now. I agree with plan for colonoscopy to assess degree of bowel obstruction. She may need divergent colostomy. I have spoken with the general surgeon related to surgical resection for locally advanced pancreatic cancer. Given her age and other comorbidities, morbidity and mortality would be high.  She would need further staging scans to be done in the future. I discussed with the patient and her daughter about prognosis for unresectable pancreatic cancer. We discussed about the role of palliative chemotherapy once her bowel obstruction issue resolves. I did not go into great detail about individual side effects of chemotherapy. I will return on 01/18/2014 for further discussion pending further information from her colonoscopy which is scheduled for tomorrow. I addressed all questions and concerns.   All questions were answered. The patient knows to call the clinic with any  problems, questions or concerns.    [facility], [place], MD 01/16/2014 2:46 PM

## 2014-01-16 NOTE — Progress Notes (Signed)
INITIAL NUTRITION ASSESSMENT  DOCUMENTATION CODES Per approved criteria  -Not Applicable   INTERVENTION: Clear liquid diet with advancement per MD Resource Breeze po TID, each supplement provides 250 kcal and 9 grams of protein  RD to follow plan of care  NUTRITION DIAGNOSIS: Inadequate oral intake related to altered gi fucntion as evidenced by clear liquid diet..   Goal: Tolerate diet advancement with intake to meet >90% estimated needs.  Monitor:  Intake, labs, weight trend, plan of care.  Reason for Assessment: MST  80 y.o. female  Admitting Dx: Colonic obstruction  ASSESSMENT: Patient with left upper quadrant mass/colonic obstruction worrisome for malignancy with concerns for pancreatic source.  9/6: Patient tolerating clear liquid diet well Ate well until 1 week ago. UBW 130 lbs with weight loss in the last 4 months.  Height: Ht Readings from Last 1 Encounters:  01/14/14 5\' 2"  (1.575 m)    Weight: Wt Readings from Last 1 Encounters:  01/16/14 121 lb 7.6 oz (55.1 kg)    Ideal Body Weight: 110 lbs  % Ideal Body Weight: 110  Wt Readings from Last 10 Encounters:  01/16/14 121 lb 7.6 oz (55.1 kg)  11/23/12 128 lb 2 oz (58.117 kg)  10/09/12 127 lb 12.8 oz (57.97 kg)  09/17/12 130 lb (58.968 kg)  09/16/12 130 lb 6.4 oz (59.149 kg)    Usual Body Weight: 130 lbs  % Usual Body Weight: 93  BMI:  Body mass index is 22.21 kg/(m^2).  Estimated Nutritional Needs: Kcal: 1600-1700 Protein: 70-80 gm Fluid: >1.6L  Skin: intact  Diet Order: Clear Liquid  EDUCATION NEEDS: -No education needs identified at this time   Intake/Output Summary (Last 24 hours) at 01/16/14 1429 Last data filed at 01/16/14 0617  Gross per 24 hour  Intake    240 ml  Output   1450 ml  Net  -1210 ml    Last BM: 2 loose BM's Saturday    Labs:   Recent Labs Lab 01/14/14 1402 01/15/14 0518  NA 134* 131*  K 4.1 3.6*  CL 95* 93*  CO2 25 21  BUN 24* 19  CREATININE  1.00 0.85  CALCIUM 9.9 9.3  GLUCOSE 114* 148*    CBG (last 3)   Recent Labs  01/15/14 2133 01/16/14 0747 01/16/14 1155  GLUCAP 107* 113* 104*    Scheduled Meds: . enoxaparin (LOVENOX) injection  40 mg Subcutaneous Q24H  . insulin aspart  0-9 Units Subcutaneous TID WC  . lisinopril  2.5 mg Oral q morning - 10a  . pantoprazole  40 mg Oral Daily  . triamterene-hydrochlorothiazide  1 tablet Oral q morning - 10a    Continuous Infusions:   Past Medical History  Diagnosis Date  . Hypertension   . Diabetes mellitus   . Hypercholesteremia   . Anxiety   . MI (myocardial infarction)   . H. pylori infection   . Gastritis     Past Surgical History  Procedure Laterality Date  . Cesarean section    . Upper gastrointestinal endoscopy  09/2012    Dr. [name]    Antonieta Iba, RD, LDN Clinical Inpatient Dietitian Pager:  [phone] Weekend and after hours pager:  [phone]

## 2014-01-16 NOTE — Progress Notes (Signed)
Subjective: Denies nausea or vomiting over the past 24 hours. Tolerating some clear liquids. Had 2 liquid BMs last night.   Objective: Vital signs in last 24 hours: Temp:  [97.3 F (36.3 C)-98.4 F (36.9 C)] 97.8 F (36.6 C) (09/06 0609) Pulse Rate:  [65-91] 91 (09/06 0609) Resp:  [16] 16 (09/06 0609) BP: (123-151)/(72-96) 150/96 mmHg (09/06 0609) SpO2:  [97 %-100 %] 97 % (09/06 0609) Weight:  [121 lb 7.6 oz (55.1 kg)] 121 lb 7.6 oz (55.1 kg) (09/06 0609) Last BM Date: 01/15/14  Intake/Output from previous day: 09/05 0701 - 09/06 0700 In: 240 [P.O.:240] Out: 1450 [Urine:1450] Intake/Output this shift:    PE: General- In NAD Abdomen-soft and not distended, no tenderness.  Lab Results:   Recent Labs  01/14/14 1402 01/15/14 0518  WBC 7.0 6.4  HGB 12.9 12.0  HCT 38.7 37.2  PLT 233 225   BMET  Recent Labs  01/14/14 1402 01/15/14 0518  NA 134* 131*  K 4.1 3.6*  CL 95* 93*  CO2 25 21  GLUCOSE 114* 148*  BUN 24* 19  CREATININE 1.00 0.85  CALCIUM 9.9 9.3   PT/INR No results found for this basename: LABPROT, INR,  in the last 72 hours Comprehensive Metabolic Panel:    Component Value Date/Time   NA 131* 01/15/2014 0518   NA 134* 01/14/2014 1402   K 3.6* 01/15/2014 0518   K 4.1 01/14/2014 1402   CL 93* 01/15/2014 0518   CL 95* 01/14/2014 1402   CO2 21 01/15/2014 0518   CO2 25 01/14/2014 1402   BUN 19 01/15/2014 0518   BUN 24* 01/14/2014 1402   CREATININE 0.85 01/15/2014 0518   CREATININE 1.00 01/14/2014 1402   GLUCOSE 148* 01/15/2014 0518   GLUCOSE 114* 01/14/2014 1402   CALCIUM 9.3 01/15/2014 0518   CALCIUM 9.9 01/14/2014 1402   AST 23 01/14/2014 1402   AST 31 08/20/2012 0950   ALT 13 01/14/2014 1402   ALT 18 08/20/2012 0950   ALKPHOS 56 01/14/2014 1402   ALKPHOS 45 08/20/2012 0950   BILITOT 0.4 01/14/2014 1402   BILITOT 0.3 08/20/2012 0950   PROT 8.1 01/14/2014 1402   PROT 7.0 08/20/2012 0950   ALBUMIN 3.9 01/14/2014 1402   ALBUMIN 3.7 08/20/2012 0950     Studies/Results: Ct  Abdomen Pelvis W Contrast  01/14/2014   CLINICAL DATA:  Abdominal pain and constipation.  EXAM: CT ABDOMEN AND PELVIS WITH CONTRAST  TECHNIQUE: Multidetector CT imaging of the abdomen and pelvis was performed using the standard protocol following bolus administration of intravenous contrast.  CONTRAST:  64mL OMNIPAQUE IOHEXOL 300 MG/ML SOLN, 175mL OMNIPAQUE IOHEXOL 300 MG/ML SOLN  COMPARISON:  08/20/2012  FINDINGS: Visualized lower chest again demonstrates changes related to prior myocardial infarction with an apical ventricular aneurysm again identified.  The liver shows steatosis. No hepatic masses or biliary obstruction identified. The gallbladder is unremarkable.  The spleen, pancreas, adrenal glands and kidneys are unremarkable.  There is significant fecal material noted with colonic distention beginning at the level of the cecum and continuing to the level of the splenic flexure. At the level of the splenic flexure, there is caliber change of the colon with decompression of the descending colon and distal colon. Amorphous, lobulated soft tissue in this region measures roughly 5.1 x 5.3 x 5.7 cm in greatest dimensions and is very suspicious for an obstructing tumor. It is difficult to identify whether this tumor is originating from the tail of the pancreas or the colon.  The lobulated soft tissue abuts the tail of the pancreas, the posterior wall of the stomach, the colon and the splenic hilum. It also nearly abuts the left kidney. Tumor origin is suspected to be most likely the pancreatic tail followed by the colon. This was not present on the prior CT in appears to be an aggressive process.  No evidence of bowel perforation or abnormal fluid collection. No enlarged lymph nodes are seen. The bladder is distended. No hernias are seen. Bony structures are unremarkable.  IMPRESSION: Left upper quadrant soft tissue mass causing obstruction of the colon at the level of the splenic flexure. The mass measures  approximately 5.7 cm in greatest dimensions. Exact origin of the tumor is not entirely clear by CT. 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 Second most likely origin would be the colon. No associated colonic perforation is identified by CT. No associated enlarged lymph nodes or distant metastatic lesions are identified in the abdomen or pelvis.  Critical Value/emergent results were called by telephone at the time of interpretation on 01/14/2014 at 5:55 pm to Dr. Tomi Bamberger , who verbally acknowledged these results.   Electronically Signed   By: Aletta Edouard M.D.   On: 01/14/2014 17:58   Dg Abd 2 Views  01/15/2014   CLINICAL DATA:  Abdominal pain  EXAM: ABDOMEN - 2 VIEW  COMPARISON:  CT abdomen pelvis dated 01/14/2014  FINDINGS: Nonspecific but nonobstructive bowel gas pattern, without disproportionate small bowel dilatation to suggest small bowel obstruction.  Residual contrast within mildly prominent but nondilated loops of colon.  No evidence of free air under the diaphragm on the upright view.  Visualized osseous structures are within normal limits.  IMPRESSION: No evidence of small bowel obstruction or free air.   Electronically Signed   By: Julian Hy M.D.   On: 01/15/2014 08:27   Dg Abd Acute W/chest  01/14/2014   CLINICAL DATA:  Abdominal pain, nausea and vomiting  EXAM: ACUTE ABDOMEN SERIES (ABDOMEN 2 VIEW & CHEST 1 VIEW)  COMPARISON:  12/17/2011 similar exam  FINDINGS: Moderate enlargement of the cardiac silhouette is reidentified. The lungs are clear. No pleural effusion. Left lower lobe scarring is stable. No free air beneath the diaphragms.  Normal bowel gas pattern. No abnormal radiopacity. No acute osseous finding. Degenerative changes are noted at the symphysis pubis.  IMPRESSION: Negative abdominal radiographs.  No acute cardiopulmonary disease.   Electronically Signed   By: Conchita Paris M.D.   On: 01/14/2014 14:34     Anti-infectives: Anti-infectives   None      Assessment Principal Problem:   Colonic obstruction, partial due to LUQ mass-I suspect this is a pancreatic cancer this fairly locally aggressive. I spoke with her about the nature of the operation again and she is not sure this is something she wants to go through. She also wondered about getting an opinion somewhere else.    LOS: 2 days   Plan:  Check plain abdominal films. Consult oncology and she is agreeable to this. Hopefully could have a flexible sigmoidoscopy by gastroenterology to see the obstructing nature of the lesion.   Karen Arias 01/16/2014

## 2014-01-16 NOTE — Consult Note (Addendum)
Cross cover [facility] Reason for Consult: Change in bowel habits and abnormal CT scan. Referring Physician: THP  Karen Arias is an 80 y.o. female.  HPI: 80 year old black female, who was in her USOH till about 2 weeks ago, she had crampy abdominal pain. In the process of her workup, she was found to have a LUQ mass involving the pancreas, left kidney, splenic flexure of the colon and maybe the stomach. She claims she has lost about 10 lbs through all of this. She gives a history of having a colonoscopy done about 10 years ago at [place]. She had an EGD done by Dr. [name] last year when she was found to have a hiatal hernia. The exam was otherwise unremarkable. On her recent CT she was found to have a collapsed distal colon beyond the splenic flexure. Her CA 19-9 is elevated at 3433. CA 125 is pending.    Past Medical History  Diagnosis Date  . Hypertension   . Diabetes mellitus   . Hypercholesteremia   . Anxiety   . MI (myocardial infarction)   . H. pylori infection   . Gastritis    Past Surgical History  Procedure Laterality Date  . Cesarean section    . Upper gastrointestinal endoscopy  09/2012    Dr. [name]   Family History  Problem Relation Age of Onset  . Diabetes Father   . Hypertension Father   . Heart failure Father    Social History:  reports that she has never smoked. She has never used smokeless tobacco. She reports that she does not drink alcohol or use illicit drugs.  Allergies: No Known Allergies  Medications: I have reviewed the patient's current medications.  Results for orders placed during the hospital encounter of 01/14/14 (from the past 48 hour(s))  URINALYSIS, ROUTINE W REFLEX MICROSCOPIC     Status: None   Collection Time    01/14/14  2:00 PM      Result Value Ref Range   Color, Urine YELLOW  YELLOW   APPearance CLEAR  CLEAR   Specific Gravity, Urine 1.016  1.005 - 1.030   pH 8.0  5.0 - 8.0   Glucose, UA NEGATIVE  NEGATIVE mg/dL   Hgb urine  dipstick NEGATIVE  NEGATIVE   Bilirubin Urine NEGATIVE  NEGATIVE   Ketones, ur NEGATIVE  NEGATIVE mg/dL   Protein, ur NEGATIVE  NEGATIVE mg/dL   Urobilinogen, UA 0.2  0.0 - 1.0 mg/dL   Nitrite NEGATIVE  NEGATIVE   Leukocytes, UA NEGATIVE  NEGATIVE   Comment: MICROSCOPIC NOT DONE ON URINES WITH NEGATIVE PROTEIN, BLOOD, LEUKOCYTES, NITRITE, OR GLUCOSE <1000 mg/dL.  CBC WITH DIFFERENTIAL     Status: None   Collection Time    01/14/14  2:02 PM      Result Value Ref Range   WBC 7.0  4.0 - 10.5 K/uL   RBC 4.23  3.87 - 5.11 MIL/uL   Hemoglobin 12.9  12.0 - 15.0 g/dL   HCT 38.7  36.0 - 46.0 %   MCV 91.5  78.0 - 100.0 fL   MCH 30.5  26.0 - 34.0 pg   MCHC 33.3  30.0 - 36.0 g/dL   RDW 13.0  11.5 - 15.5 %   Platelets 233  150 - 400 K/uL   Neutrophils Relative % 64  43 - 77 %   Neutro Abs 4.5  1.7 - 7.7 K/uL   Lymphocytes Relative 25  12 - 46 %   Lymphs Abs  1.8  0.7 - 4.0 K/uL   Monocytes Relative 10  3 - 12 %   Monocytes Absolute 0.7  0.1 - 1.0 K/uL   Eosinophils Relative 1  0 - 5 %   Eosinophils Absolute 0.1  0.0 - 0.7 K/uL   Basophils Relative 0  0 - 1 %   Basophils Absolute 0.0  0.0 - 0.1 K/uL  COMPREHENSIVE METABOLIC PANEL     Status: Abnormal   Collection Time    01/14/14  2:02 PM      Result Value Ref Range   Sodium 134 (*) 137 - 147 mEq/L   Potassium 4.1  3.7 - 5.3 mEq/L   Chloride 95 (*) 96 - 112 mEq/L   CO2 25  19 - 32 mEq/L   Glucose, Bld 114 (*) 70 - 99 mg/dL   BUN 24 (*) 6 - 23 mg/dL   Creatinine, Ser 1.00  0.50 - 1.10 mg/dL   Calcium 9.9  8.4 - 10.5 mg/dL   Total Protein 8.1  6.0 - 8.3 g/dL   Albumin 3.9  3.5 - 5.2 g/dL   AST 23  0 - 37 U/L   ALT 13  0 - 35 U/L   Alkaline Phosphatase 56  39 - 117 U/L   Total Bilirubin 0.4  0.3 - 1.2 mg/dL   GFR calc non Af Amer 51 (*) >90 mL/min   GFR calc Af Amer 59 (*) >90 mL/min   Comment: (NOTE)     The eGFR has been calculated using the CKD EPI equation.     This calculation has not been validated in all clinical situations.      eGFR's persistently <90 mL/min signify possible Chronic Kidney     Disease.   Anion gap 14  5 - 15  LIPASE, BLOOD     Status: None   Collection Time    01/14/14  2:02 PM      Result Value Ref Range   Lipase 32  11 - 59 U/L  CEA     Status: None   Collection Time    01/14/14  9:00 PM      Result Value Ref Range   CEA 4.9  0.0 - 5.0 ng/mL   Comment: Performed at [place] [facility] 19-9     Status: Abnormal   Collection Time    01/14/14  9:00 PM      Result Value Ref Range   CA 19-9 3433.9 (*) <35.0 U/mL   Comment: (NOTE)     Result repeated and verified.     Result confirmed by automatic dilution.     Performed at [facility], [facility]     Status: Abnormal   Collection Time    01/14/14  9:14 PM      Result Value Ref Range   Glucose-Capillary 123 (*) 70 - 99 mg/dL   Comment 1 Notify RN    BASIC METABOLIC PANEL     Status: Abnormal   Collection Time    01/15/14  5:18 AM      Result Value Ref Range   Sodium 131 (*) 137 - 147 mEq/L   Potassium 3.6 (*) 3.7 - 5.3 mEq/L   Chloride 93 (*) 96 - 112 mEq/L   CO2 21  19 - 32 mEq/L   Glucose, Bld 148 (*) 70 - 99 mg/dL   BUN 19  6 - 23 mg/dL   Creatinine, Ser 0.85  0.50 - 1.10 mg/dL  Calcium 9.3  8.4 - 10.5 mg/dL   GFR calc non Af Amer 62 (*) >90 mL/min   GFR calc Af Amer 71 (*) >90 mL/min   Comment: (NOTE)     The eGFR has been calculated using the CKD EPI equation.     This calculation has not been validated in all clinical situations.     eGFR's persistently <90 mL/min signify possible Chronic Kidney     Disease.   Anion gap 17 (*) 5 - 15  CBC     Status: None   Collection Time    01/15/14  5:18 AM      Result Value Ref Range   WBC 6.4  4.0 - 10.5 K/uL   RBC 4.10  3.87 - 5.11 MIL/uL   Hemoglobin 12.0  12.0 - 15.0 g/dL   HCT 37.2  36.0 - 46.0 %   MCV 90.7  78.0 - 100.0 fL   MCH 29.3  26.0 - 34.0 pg   MCHC 32.3  30.0 - 36.0 g/dL   RDW 12.8  11.5 - 15.5 %   Platelets 225  150 -  400 K/uL  GLUCOSE, CAPILLARY     Status: Abnormal   Collection Time    01/15/14  7:39 AM      Result Value Ref Range   Glucose-Capillary 146 (*) 70 - 99 mg/dL  GLUCOSE, CAPILLARY     Status: Abnormal   Collection Time    01/15/14 12:31 PM      Result Value Ref Range   Glucose-Capillary 117 (*) 70 - 99 mg/dL  GLUCOSE, CAPILLARY     Status: Abnormal   Collection Time    01/15/14  4:27 PM      Result Value Ref Range   Glucose-Capillary 171 (*) 70 - 99 mg/dL  GLUCOSE, CAPILLARY     Status: Abnormal   Collection Time    01/15/14  9:33 PM      Result Value Ref Range   Glucose-Capillary 107 (*) 70 - 99 mg/dL   Comment 1 Notify RN     Ct Abdomen Pelvis W Contrast  01/14/2014   CLINICAL DATA:  Abdominal pain and constipation.  EXAM: CT ABDOMEN AND PELVIS WITH CONTRAST  TECHNIQUE: Multidetector CT imaging of the abdomen and pelvis was performed using the standard protocol following bolus administration of intravenous contrast.  CONTRAST:  34m OMNIPAQUE IOHEXOL 300 MG/ML SOLN, 1049mOMNIPAQUE IOHEXOL 300 MG/ML SOLN  COMPARISON:  08/20/2012  FINDINGS: Visualized lower chest again demonstrates changes related to prior myocardial infarction with an apical ventricular aneurysm again identified.  The liver shows steatosis. No hepatic masses or biliary obstruction identified. The gallbladder is unremarkable.  The spleen, pancreas, adrenal glands and kidneys are unremarkable.  There is significant fecal material noted with colonic distention beginning at the level of the cecum and continuing to the level of the splenic flexure. At the level of the splenic flexure, there is caliber change of the colon with decompression of the descending colon and distal colon. Amorphous, lobulated soft tissue in this region measures roughly 5.1 x 5.3 x 5.7 cm in greatest dimensions and is very suspicious for an obstructing tumor. It is difficult to identify whether this tumor is originating from the tail of the pancreas or the  colon. The lobulated soft tissue abuts the tail of the pancreas, the posterior wall of the stomach, the colon and the splenic hilum. It also nearly abuts the left kidney. Tumor origin is suspected to be most likely  the pancreatic tail followed by the colon. This was not present on the prior CT in appears to be an aggressive process.  No evidence of bowel perforation or abnormal fluid collection. No enlarged lymph nodes are seen. The bladder is distended. No hernias are seen. Bony structures are unremarkable.  IMPRESSION: Left upper quadrant soft tissue mass causing obstruction of the colon at the level of the splenic flexure. The mass measures approximately 5.7 cm in greatest dimensions. Exact origin of the tumor is not entirely clear by CT. 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 Second most likely origin would be the colon. No associated colonic perforation is identified by CT. No associated enlarged lymph nodes or distant metastatic lesions are identified in the abdomen or pelvis.  Critical Value/emergent results were called by telephone at the time of interpretation on 01/14/2014 at 5:55 pm to Dr. Tomi Bamberger , who verbally acknowledged these results.   Electronically Signed   By: Aletta Edouard M.D.   On: 01/14/2014 17:58   Dg Abd 2 Views  01/15/2014   CLINICAL DATA:  Abdominal pain  EXAM: ABDOMEN - 2 VIEW  COMPARISON:  CT abdomen pelvis dated 01/14/2014  FINDINGS: Nonspecific but nonobstructive bowel gas pattern, without disproportionate small bowel dilatation to suggest small bowel obstruction.  Residual contrast within mildly prominent but nondilated loops of colon.  No evidence of free air under the diaphragm on the upright view.  Visualized osseous structures are within normal limits.  IMPRESSION: No evidence of small bowel obstruction or free air.   Electronically Signed   By: Julian Hy M.D.   On: 01/15/2014 08:27   Dg Abd Acute W/chest  01/14/2014    CLINICAL DATA:  Abdominal pain, nausea and vomiting  EXAM: ACUTE ABDOMEN SERIES (ABDOMEN 2 VIEW & CHEST 1 VIEW)  COMPARISON:  12/17/2011 similar exam  FINDINGS: Moderate enlargement of the cardiac silhouette is reidentified. The lungs are clear. No pleural effusion. Left lower lobe scarring is stable. No free air beneath the diaphragms.  Normal bowel gas pattern. No abnormal radiopacity. No acute osseous finding. Degenerative changes are noted at the symphysis pubis.  IMPRESSION: Negative abdominal radiographs.  No acute cardiopulmonary disease.   Electronically Signed   By: Conchita Paris M.D.   On: 01/14/2014 14:34   Review of Systems  Constitutional: Positive for weight loss. Negative for fever, chills, malaise/fatigue and diaphoresis.  HENT: Negative.   Eyes: Negative.   Respiratory: Negative.   Cardiovascular: Negative.   Gastrointestinal: Positive for abdominal pain and constipation. Negative for heartburn, nausea, vomiting, blood in stool and melena.  Genitourinary: Negative.   Musculoskeletal: Negative.   Skin: Negative.   Neurological: Negative.  Negative for weakness.  Endo/Heme/Allergies: Negative.   Psychiatric/Behavioral: Negative.    Blood pressure 150/96, pulse 91, temperature 97.8 F (36.6 C), temperature source Oral, resp. rate 16, height _0  (1.575 m), weight 55.1 kg (121 lb 7.6 oz), SpO2 97.00%. Physical Exam  Constitutional: She is oriented to person, place, and time. She appears well-developed and well-nourished.  HENT:  Head: Normocephalic and atraumatic.  Eyes: Conjunctivae and EOM are normal. Pupils are equal, round, and reactive to light.  Neck: Normal range of motion. Neck supple.  Cardiovascular: Normal rate and regular rhythm.   Respiratory: Effort normal and breath sounds normal.  GI: Soft. Bowel sounds are normal.  Musculoskeletal: Normal range of motion.  Neurological: She is alert and oriented to person, place, and time.  Skin: Skin is warm  and dry.   Psychiatric: She has a normal mood and affect. Her behavior is normal. Judgment and thought content normal.   Assessment/Plan: 1) Change in bowel habits-constipation with 10 lb weight loss and an abnormal CT scan: ?pancreatic vs colon mass-will try to prep her gently for a colonoscopy by Tuesday-discussed with Dr. Zella Richer. ?mass in the LUQ.  2) ?Bowel obstruction: continue to monitor. Agree with enemas for now.     3) Hiatal hernia on EGD done last year.  4) HTN/Hyperlipidemia.  5) ?Umbilical hernia.  Taylyn Brame 01/16/2014, 7:22 AM

## 2014-01-16 NOTE — Progress Notes (Signed)
Cross cover [facility] Subjective: Since I last evaluated the patient, she seems to be doing fairly well. She has had minimal return with the enemas given to her last night.   Objective: Vital signs in last 24 hours: Temp:  [97.8 F (36.6 C)-98.4 F (36.9 C)] 97.8 F (36.6 C) (09/06 0609) Pulse Rate:  [65-91] 91 (09/06 0609) Resp:  [16] 16 (09/06 0609) BP: (123-150)/(72-96) 150/96 mmHg (09/06 0609) SpO2:  [97 %-100 %] 97 % (09/06 0609) Weight:  [55.1 kg (121 lb 7.6 oz)] 55.1 kg (121 lb 7.6 oz) (09/06 0609) Last BM Date: 01/15/14  Intake/Output from previous day: 09/05 0701 - 09/06 0700 In: 240 [P.O.:240] Out: 1450 [Urine:1450] Intake/Output this shift:   General appearance: alert Resp: clear to auscultation bilaterally Cardio: regular rate and rhythm, S1, S2 normal, no murmur, click, rub or gallop GI: soft, non-tender; bowel sounds normal; no masses,  no organomegaly; a small reducible umbilical hernia noted on abdominal exam Extremities: extremities normal, atraumatic, no cyanosis or edema  Lab Results:  Recent Labs  01/14/14 1402 01/15/14 0518  WBC 7.0 6.4  HGB 12.9 12.0  HCT 38.7 37.2  PLT 233 225   BMET  Recent Labs  01/14/14 1402 01/15/14 0518  NA 134* 131*  K 4.1 3.6*  CL 95* 93*  CO2 25 21  GLUCOSE 114* 148*  BUN 24* 19  CREATININE 1.00 0.85  CALCIUM 9.9 9.3   LFT  Recent Labs  01/14/14 1402  PROT 8.1  ALBUMIN 3.9  AST 23  ALT 13  ALKPHOS 56  BILITOT 0.4   Studies/Results: Ct Abdomen Pelvis W Contrast  01/14/2014   CLINICAL DATA:  Abdominal pain and constipation.  EXAM: CT ABDOMEN AND PELVIS WITH CONTRAST  TECHNIQUE: Multidetector CT imaging of the abdomen and pelvis was performed using the standard protocol following bolus administration of intravenous contrast.  CONTRAST:  6mL OMNIPAQUE IOHEXOL 300 MG/ML SOLN, 131mL OMNIPAQUE IOHEXOL 300 MG/ML SOLN  COMPARISON:  08/20/2012  FINDINGS: Visualized lower chest again demonstrates changes related to  prior myocardial infarction with an apical ventricular aneurysm again identified.  The liver shows steatosis. No hepatic masses or biliary obstruction identified. The gallbladder is unremarkable.  The spleen, pancreas, adrenal glands and kidneys are unremarkable.  There is significant fecal material noted with colonic distention beginning at the level of the cecum and continuing to the level of the splenic flexure. At the level of the splenic flexure, there is caliber change of the colon with decompression of the descending colon and distal colon. Amorphous, lobulated soft tissue in this region measures roughly 5.1 x 5.3 x 5.7 cm in greatest dimensions and is very suspicious for an obstructing tumor. It is difficult to identify whether this tumor is originating from the tail of the pancreas or the colon. The lobulated soft tissue abuts the tail of the pancreas, the posterior wall of the stomach, the colon and the splenic hilum. It also nearly abuts the left kidney. Tumor origin is suspected to be most likely the pancreatic tail followed by the colon. This was not present on the prior CT in appears to be an aggressive process.  No evidence of bowel perforation or abnormal fluid collection. No enlarged lymph nodes are seen. The bladder is distended. No hernias are seen. Bony structures are unremarkable.  IMPRESSION: Left upper quadrant soft tissue mass causing obstruction of the colon at the level of the splenic flexure. The mass measures approximately 5.7 cm in greatest dimensions. Exact origin of the tumor  is not entirely clear by CT. 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 Second most likely origin would be the colon. No associated colonic perforation is identified by CT. No associated enlarged lymph nodes or distant metastatic lesions are identified in the abdomen or pelvis.  Critical Value/emergent results were called by telephone at the time of interpretation on  01/14/2014 at 5:55 pm to Dr. Tomi Bamberger , who verbally acknowledged these results.   Electronically Signed   By: Aletta Edouard M.D.   On: 01/14/2014 17:58   Dg Abd 2 Views  01/16/2014   CLINICAL DATA:  Abdominal pain and constipation. Partial colonic obstruction.  EXAM: ABDOMEN - 2 VIEW  COMPARISON:  One day prior  FINDINGS: Upright and supine views. The upright view demonstrates no free intraperitoneal air or significant air-fluid levels. The supine view demonstrates contrast within normal caliber hepatic flexure and transverse colon. Small volume sigmoid gas identified. No significant small bowel distension.  IMPRESSION: Similar bowel gas pattern with contrast within normal caliber transverse colon. No evidence of bowel obstruction.   Electronically Signed   By: Abigail Miyamoto M.D.   On: 01/16/2014 13:00   Dg Abd 2 Views  01/15/2014   CLINICAL DATA:  Abdominal pain  EXAM: ABDOMEN - 2 VIEW  COMPARISON:  CT abdomen pelvis dated 01/14/2014  FINDINGS: Nonspecific but nonobstructive bowel gas pattern, without disproportionate small bowel dilatation to suggest small bowel obstruction.  Residual contrast within mildly prominent but nondilated loops of colon.  No evidence of free air under the diaphragm on the upright view.  Visualized osseous structures are within normal limits.  IMPRESSION: No evidence of small bowel obstruction or free air.   Electronically Signed   By: Julian Hy M.D.   On: 01/15/2014 08:27   Medications: I have reviewed the patient's current medications.  Assessment/Plan: Change in bowel habits; abnormal weight loss; abnormal CT scan with ?LUQ mass: will try to prep for a colonoscopy tomorrow or on Tuesday.  LOS: 2 days   Willys Salvino 01/16/2014, 2:51 PM

## 2014-01-16 NOTE — Progress Notes (Signed)
Patient ID: Karen Arias, female   DOB: [birth year], 80 y.o.   MRN: [id number] [facility] PROGRESS NOTE  MARKEA RUZICH ZJI:[id number] DOB: 04-11-1929 DOA: 01/14/2014 PCP: [name], MD  Brief narrative: 80 yo pleasant female with history of hypertension, hiatal hernia (EGD in 09/2012) who followed with Dr. [name] for GI related issues. This time she presented with abdominal pain and constipation. She was found to have LUQ soft tissue mass (5.7 cm in diameter) on CT abdomen causing obstruction of the colon at the level of the splenic flexure, worrisome for malignancy but origin unclear.   Assessment/Plan:   Principal Problem:  Left upper quadrant mass / colonic obstruction worrisome for malignancy  Mass worrisome for malignancy. Appreciate surgery recommendations.  GI consulted, Dr. Collene Mares to see the pt. May need sigmoidoscopy or colonoscopy.  CEA WNL. CA 19-9 3434. CA 125 is pending. Clear liquid diet.  Active Problems:  HTN  Continue home medications: Maxzide and Lisinopril  Diabetes mellitus  Continue SSI for now DVT Prophylaxis  Lovenox sub Q while pt in hospital.    Code Status: Full.  Family Communication: plan of care discussed with the patient anf her daughter at the bedside  Disposition Plan: Home when stable.   IV Access:   Peripheral IV Procedures and diagnostic studies:   Ct Abdomen Pelvis W Contrast 01/14/2014 Left upper quadrant soft tissue mass causing obstruction of the colon at the level of the splenic flexure. The mass measures approximately 5.7 cm in greatest dimensions. Exact origin of the tumor is not entirely clear by CT. 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 Second most likely origin would be the colon. No associated colonic perforation is identified by CT. No associated enlarged lymph nodes or distant metastatic lesions are identified in the abdomen or pelvis.  Dg Abd Acute W/chest 01/14/2014  Negative abdominal radiographs. No acute cardiopulmonary disease.  Dg Abd 2 Views 01/15/2014 : No evidence of small bowel obstruction or free air.  Dg Abd 01/16/2014: no evidence of bowel obstruction  Medical Consultants:   Surgery  Gastroenterology (Dr. Collene Mares, [place]) Other Consultants:   None  Anti-Infectives:   None     [name], MD  [facility] Pager [phone]  If 7PM-7AM, please contact night-coverage [email or web address] Password [email or web address] 01/16/2014, 1:38 PM   LOS: 2 days    HPI/Subjective: No acute overnight events.  Objective: Filed Vitals:   01/15/14 0500 01/15/14 1430 01/15/14 2034 01/16/14 0609  BP:  151/72 123/72 150/96  Pulse:  86 65 91  Temp:  97.3 F (36.3 C) 98.4 F (36.9 C) 97.8 F (36.6 C)  TempSrc:  Oral Oral Oral  Resp:  16 16 16   Height:      Weight: 58.741 kg (129 lb 8 oz)   55.1 kg (121 lb 7.6 oz)  SpO2:  99% 100% 97%    Intake/Output Summary (Last 24 hours) at 01/16/14 1338 Last data filed at 01/16/14 0617  Gross per 24 hour  Intake    240 ml  Output   1450 ml  Net  -1210 ml    Exam:   General:  Pt is alert, not in acute distress  Cardiovascular: Regular rate and rhythm, S1/S2 (+)  Respiratory: Clear to auscultation bilaterally, no wheezing  Abdomen: Soft, non tender, non distended, bowel sounds present  Extremities: pulses DP and PT palpable bilaterally  Neuro: Grossly nonfocal  Data Reviewed: Basic Metabolic Panel:  Recent Labs Lab 01/14/14 1402 01/15/14  0518  NA 134* 131*  K 4.1 3.6*  CL 95* 93*  CO2 25 21  GLUCOSE 114* 148*  BUN 24* 19  CREATININE 1.00 0.85  CALCIUM 9.9 9.3   Liver Function Tests:  Recent Labs Lab 01/14/14 1402  AST 23  ALT 13  ALKPHOS 56  BILITOT 0.4  PROT 8.1  ALBUMIN 3.9    Recent Labs Lab 01/14/14 1402  LIPASE 32   No results found for this basename: AMMONIA,  in the last 168 hours CBC:  Recent Labs Lab 01/14/14 1402 01/15/14 0518  WBC 7.0 6.4  NEUTROABS 4.5  --   HGB  12.9 12.0  HCT 38.7 37.2  MCV 91.5 90.7  PLT 233 225   Cardiac Enzymes: No results found for this basename: CKTOTAL, CKMB, CKMBINDEX, TROPONINI,  in the last 168 hours BNP: No components found with this basename: POCBNP,  CBG:  Recent Labs Lab 01/15/14 1231 01/15/14 1627 01/15/14 2133 01/16/14 0747 01/16/14 1155  GLUCAP 117* 171* 107* 113* 104*    No results found for this or any previous visit (from the past 240 hour(s)).   Scheduled Meds: . enoxaparin (LOVENOX) injection  40 mg Subcutaneous Q24H  . insulin aspart  0-9 Units Subcutaneous TID WC  . lisinopril  2.5 mg Oral q morning - 10a  . pantoprazole  40 mg Oral Daily  . triamterene-hydrochlorothiazide  1 tablet Oral q morning - 10a   Continuous Infusions:

## 2014-01-17 LAB — GLUCOSE, CAPILLARY
GLUCOSE-CAPILLARY: 130 mg/dL — AB (ref 70–99)
GLUCOSE-CAPILLARY: 122 mg/dL — AB (ref 70–99)
GLUCOSE-CAPILLARY: 113 mg/dL — AB (ref 70–99)
GLUCOSE-CAPILLARY: 114 mg/dL — AB (ref 70–99)

## 2014-01-17 NOTE — Progress Notes (Addendum)
Patient ID: Karen Arias, female   DOB: 11-24-1928, 80 y.o.   MRN: [id number] [facility] PROGRESS NOTE  TALIBAH COLASURDO NTI:[id number] DOB: Jul 28, 1928 DOA: 01/14/2014 PCP: [name], MD  Brief narrative: 80 yo pleasant female with history of hypertension, hiatal hernia (EGD in 09/2012) who followed with Dr. [name] for GI related issues. This time she presented with abdominal pain and constipation. She was found to have LUQ soft tissue mass (5.7 cm in diameter) on CT abdomen causing obstruction of the colon at the level of the splenic flexure, worrisome for malignancy but origin unclear. Colonoscopy prep attempted but patient could not tolerate well.  Assessment/Plan:   Principal Problem:  Left upper quadrant mass / colonic obstruction worrisome for malignancy  Left upper quadrant mass worrisome for malignancy. Appreciate surgery recommendations.  Plan was for colonoscopy however patient did not tolerate the prep. Likely partial near complete colon obstruction probably from extrinsic compression from pancreatic tail cancer / mass. Will followup with GI if sigmoidoscopy can be done. Appreciate GI following. CEA WNL. CA 19-9 3434. CA 125 is pending.  Clear liquid diet.  Active Problems:  HTN  Continue home medications: Maxzide and Lisinopril  Diabetes mellitus  Continue SSI for now DVT Prophylaxis  Lovenox sub Q while pt in hospital.    Code Status: Full.  Family Communication: plan of care discussed with the patient anf her daughter at the bedside  Disposition Plan: Home when stable.    IV Access:   Peripheral IV Procedures and diagnostic studies:   Ct Abdomen Pelvis W Contrast 01/14/2014 Left upper quadrant soft tissue mass causing obstruction of the colon at the level of the splenic flexure. The mass measures approximately 5.7 cm in greatest dimensions. Exact origin of the tumor is not entirely clear by CT. However, this is favored to represent a pancreatic tail  carcinoma invading the colon. Second most likely origin would be the colon. No associated colonic perforation is identified by CT. No associated enlarged lymph nodes or distant metastatic lesions are identified in the abdomen or pelvis.  Dg Abd Acute W/chest 01/14/2014 Negative abdominal radiographs. No acute cardiopulmonary disease.  Dg Abd 2 Views 01/15/2014 : No evidence of small bowel obstruction or free air.  Dg Abd 01/16/2014: no evidence of bowel obstruction  Medical Consultants:   Surgery  Gastroenterology (Dr. Collene Mares, Jothi) Other Consultants:   None  Anti-Infectives:   None     [name], MD  [facility] Pager [phone]  If 7PM-7AM, please contact night-coverage [email or web address] Password [facility] 01/17/2014, 10:57 AM   LOS: 3 days    HPI/Subjective: No acute overnight events.  Objective: Filed Vitals:   01/16/14 0609 01/16/14 1500 01/16/14 2131 01/17/14 0552  BP: 150/96 151/75 161/82 154/70  Pulse: 91 80 74 80  Temp: 97.8 F (36.6 C) 98.8 F (37.1 C) 97.9 F (36.6 C) 98.6 F (37 C)  TempSrc: Oral Oral Oral Oral  Resp: 16 16 18 16   Height:      Weight: 55.1 kg (121 lb 7.6 oz)   55.974 kg (123 lb 6.4 oz)  SpO2: 97% 98% 100% 100%    Intake/Output Summary (Last 24 hours) at 01/17/14 1057 Last data filed at 01/17/14 0553  Gross per 24 hour  Intake    840 ml  Output    500 ml  Net    340 ml    Exam:   General:  Pt is alert,  not in acute distress  Cardiovascular: Regular rate and rhythm,  S1/S2, no murmurs  Respiratory: Bilateral air entry, no wheezing  Abdomen: distended, non tender, bowel sounds present  Extremities: No edema, pulses DP and PT palpable bilaterally  Neuro: Grossly nonfocal  Data Reviewed: Basic Metabolic Panel:  Recent Labs Lab 01/14/14 1402 01/15/14 0518  NA 134* 131*  K 4.1 3.6*  CL 95* 93*  CO2 25 21  GLUCOSE 114* 148*  BUN 24* 19  CREATININE 1.00 0.85  CALCIUM 9.9 9.3   Liver Function Tests:  Recent Labs Lab  01/14/14 1402  AST 23  ALT 13  ALKPHOS 56  BILITOT 0.4  PROT 8.1  ALBUMIN 3.9    Recent Labs Lab 01/14/14 1402  LIPASE 32   No results found for this basename: AMMONIA,  in the last 168 hours CBC:  Recent Labs Lab 01/14/14 1402 01/15/14 0518  WBC 7.0 6.4  NEUTROABS 4.5  --   HGB 12.9 12.0  HCT 38.7 37.2  MCV 91.5 90.7  PLT 233 225   Cardiac Enzymes: No results found for this basename: CKTOTAL, CKMB, CKMBINDEX, TROPONINI,  in the last 168 hours BNP: No components found with this basename: POCBNP,  CBG:  Recent Labs Lab 01/16/14 1155 01/16/14 1606 01/16/14 1647 01/16/14 2129 01/17/14 0738  GLUCAP 104* 116* 126* 128* 130*    No results found for this or any previous visit (from the past 240 hour(s)).   Scheduled Meds: . enoxaparin (LOVENOX) injection  40 mg Subcutaneous Q24H  . feeding supplement (RESOURCE BREEZE)  1 Container Oral TID BM  . insulin aspart  0-9 Units Subcutaneous TID WC  . lisinopril  2.5 mg Oral q morning - 10a  . pantoprazole  40 mg Oral Daily  . triamterene-hydrochlorothiazide  1 tablet Oral q morning - 10a   Continuous Infusions:

## 2014-01-17 NOTE — Progress Notes (Addendum)
[address] [place], [place] [address] Phone: [phone] FAX: Shannon [phone] [birth year]  CARE TEAM:  PCP: [name], MD  Outpatient Care Team: Patient Care Team: Lorene Dy, MD as PCP - General (Internal Medicine)  Inpatient Treatment Team: Treatment Team: Attending Provider: Robbie Lis, MD; Attending Physician: Theodis Blaze, MD; Consulting Physician: Nolon Nations, MD; Rounding Team: Joycelyn Das, MD; Registered Nurse: Clearence Ped, RN; Technician: Lily Peer, NT   Subjective:  N/V with 1/2 of bowel prep Scant flatus.  No BM w bowel prep Family in room  Objective:  Vital signs:  Filed Vitals:   01/16/14 0609 01/16/14 1500 01/16/14 2131 01/17/14 0552  BP: 150/96 151/75 161/82 154/70  Pulse: 91 80 74 80  Temp: 97.8 F (36.6 C) 98.8 F (37.1 C) 97.9 F (36.6 C) 98.6 F (37 C)  TempSrc: Oral Oral Oral Oral  Resp: 16 16 18 16   Height:      Weight: 121 lb 7.6 oz (55.1 kg)   123 lb 6.4 oz (55.974 kg)  SpO2: 97% 98% 100% 100%    Last BM Date: 01/16/14  Intake/Output   Yesterday:  09/06 0701 - 09/07 0700 In: 840 [P.O.:840] Out: 500 [Urine:500] This shift:     Bowel function:  Flatus: Scant  BM: NO  Drain: n/a  Physical Exam:  General: Pt awake/alert/oriented x4 in no acute distress Eyes: PERRL, normal EOM.  Sclera clear.  No icterus Neuro: CN II-XII intact w/o focal sensory/motor deficits. Lymph: No head/neck/groin lymphadenopathy Psych:  No delerium/psychosis/paranoia HENT: Normocephalic, Mucus membranes moist.  No thrush Neck: Supple, No tracheal deviation Chest: No chest wall pain w good excursion CV:  Pulses intact.  Regular rhythm MS: Normal AROM mjr joints.  No obvious deformity Abdomen: Soft - doughy.  Mod distended.  Nontender.  No evidence of peritonitis.  No incarcerated hernias.  Small umb hernia Ext:  SCDs BLE.  No mjr edema.   No cyanosis Skin: No petechiae / purpura   Problem List:   Principal Problem:   Colonic obstruction - partial ?LUQ mass Active Problems:   Essential hypertension, benign   Diabetes mellitus type 2 in nonobese   Abdominal pain   Assessment  Karen Arias  80 y.o. female       Partial near complete colon obstruction, most likely from extrinsic compression from pancreatic tail cancer/mass  Plan:  -sigmoid/colonoscopy soon - prob will be limited w fair result w prep.  Complete workup -VTE prophylaxis- SCDs, etc -mobilize as tolerated to help recovery  At this point, pt will need either large resection w distal pancreatectomy, splenectomy, partial gastrectomy, splenic flexure colon resection with colostomy = "LUQ-ectomy" to remove mass - large operation.  Given obstruction probably cannot immediately do reanastomosis hookup = temp colostomy.  Less aggressive option is palliative diverting colostomy.  Will see w/u complete.  Pt/family considering options.  Dr Excell Seltzer will assume care for CCS in AM & will help f/u w recs depending on w/u & pt's wishes.  Adin Hector, M.D., F.A.C.S. Gastrointestinal and Minimally Invasive Surgery [facility] [address] [place], [place] [address] [phone] Main / Paging   01/17/2014   Results:   Labs: Results for orders placed during the hospital encounter of 01/14/14 (from the past 48 hour(s))  GLUCOSE, CAPILLARY     Status: Abnormal   Collection Time    01/15/14  12:31 PM      Result Value Ref Range   Glucose-Capillary 117 (*) 70 - 99 mg/dL  GLUCOSE, CAPILLARY     Status: Abnormal   Collection Time    01/15/14  4:27 PM      Result Value Ref Range   Glucose-Capillary 171 (*) 70 - 99 mg/dL  GLUCOSE, CAPILLARY     Status: Abnormal   Collection Time    01/15/14  9:33 PM      Result Value Ref Range   Glucose-Capillary 107 (*) 70 - 99 mg/dL   Comment 1 Notify RN    GLUCOSE, CAPILLARY      Status: Abnormal   Collection Time    01/16/14  7:47 AM      Result Value Ref Range   Glucose-Capillary 113 (*) 70 - 99 mg/dL  GLUCOSE, CAPILLARY     Status: Abnormal   Collection Time    01/16/14 11:55 AM      Result Value Ref Range   Glucose-Capillary 104 (*) 70 - 99 mg/dL  GLUCOSE, CAPILLARY     Status: Abnormal   Collection Time    01/16/14  4:06 PM      Result Value Ref Range   Glucose-Capillary 116 (*) 70 - 99 mg/dL  GLUCOSE, CAPILLARY     Status: Abnormal   Collection Time    01/16/14  4:47 PM      Result Value Ref Range   Glucose-Capillary 126 (*) 70 - 99 mg/dL  GLUCOSE, CAPILLARY     Status: Abnormal   Collection Time    01/16/14  9:29 PM      Result Value Ref Range   Glucose-Capillary 128 (*) 70 - 99 mg/dL   Comment 1 Notify RN    GLUCOSE, CAPILLARY     Status: Abnormal   Collection Time    01/17/14  7:38 AM      Result Value Ref Range   Glucose-Capillary 130 (*) 70 - 99 mg/dL    Imaging / Studies: Dg Abd 2 Views  01/16/2014   CLINICAL DATA:  Abdominal pain and constipation. Partial colonic obstruction.  EXAM: ABDOMEN - 2 VIEW  COMPARISON:  One day prior  FINDINGS: Upright and supine views. The upright view demonstrates no free intraperitoneal air or significant air-fluid levels. The supine view demonstrates contrast within normal caliber hepatic flexure and transverse colon. Small volume sigmoid gas identified. No significant small bowel distension.  IMPRESSION: Similar bowel gas pattern with contrast within normal caliber transverse colon. No evidence of bowel obstruction.   Electronically Signed   By: Abigail Miyamoto M.D.   On: 01/16/2014 13:00    Medications / Allergies: per chart  Antibiotics: Anti-infectives   None       Note: Portions of this report may have been transcribed using voice recognition software. Every effort was made to ensure accuracy; however, inadvertent computerized transcription errors may be present.   Any transcriptional errors that  result from this process are unintentional.

## 2014-01-17 NOTE — Progress Notes (Signed)
Patient was able to drink half of her colon prep. She became nauseated and vomited approximately 240 ml clear emesis, and was unable to continue drinking the prep. She did not have any stool from her prep.

## 2014-01-17 NOTE — Progress Notes (Signed)
Cross cover [facility] Subjective: Since I last evaluated the patient, she has not been able to prep for the colonoscopy as she feels bloated and has vomited. She claims the 2 BM's she had yesterday were just the "enemas coming out; there was hardly any stool in it".   Objective: Vital signs in last 24 hours: Temp:  [97.9 F (36.6 C)-98.8 F (37.1 C)] 98.6 F (37 C) (09/07 0552) Pulse Rate:  [74-80] 80 (09/07 0552) Resp:  [16-18] 16 (09/07 0552) BP: (151-161)/(70-82) 154/70 mmHg (09/07 0552) SpO2:  [98 %-100 %] 100 % (09/07 0552) Weight:  [55.974 kg (123 lb 6.4 oz)] 55.974 kg (123 lb 6.4 oz) (09/07 0552) Last BM Date: 03-Feb-2014  Intake/Output from previous day: 02/04/2023 0701 - 09/07 0700 In: 840 [P.O.:840] Out: 500 [Urine:500] Intake/Output this shift:   General appearance: alert, cooperative, appears stated age and no distress Resp: clear to auscultation bilaterally Cardio: regular rate and rhythm, S1, S2 normal, no murmur, click, rub or gallop GI: soft, distended non-tender with hypoactive bowel sounds; no masses,  no organomegaly Extremities: extremities normal, atraumatic, no cyanosis or edema  Lab Results:  Recent Labs  01/14/14 1402 01/15/14 0518  WBC 7.0 6.4  HGB 12.9 12.0  HCT 38.7 37.2  PLT 233 225   BMET  Recent Labs  01/14/14 1402 01/15/14 0518  NA 134* 131*  K 4.1 3.6*  CL 95* 93*  CO2 25 21  GLUCOSE 114* 148*  BUN 24* 19  CREATININE 1.00 0.85  CALCIUM 9.9 9.3   LFT  Recent Labs  01/14/14 1402  PROT 8.1  ALBUMIN 3.9  AST 23  ALT 13  ALKPHOS 56  BILITOT 0.4   Studies/Results: Dg Abd 2 Views  2014/02/03   CLINICAL DATA:  Abdominal pain and constipation. Partial colonic obstruction.  EXAM: ABDOMEN - 2 VIEW  COMPARISON:  One day prior  FINDINGS: Upright and supine views. The upright view demonstrates no free intraperitoneal air or significant air-fluid levels. The supine view demonstrates contrast within normal caliber hepatic flexure and transverse  colon. Small volume sigmoid gas identified. No significant small bowel distension.  IMPRESSION: Similar bowel gas pattern with contrast within normal caliber transverse colon. No evidence of bowel obstruction.   Electronically Signed   By: Abigail Miyamoto M.D.   On: 02-03-14 13:00   Medications: I have reviewed the patient's current medications.  Assessment/Plan: Change in bowel habits/LUQ mass on CT-?colonic obstruction-exact site of this is unclear but it seems to involve several organs. Will try to prep her gently. She vomited up the prep last night but if she is unable to do the prep due to her colonic obstruction, we may not be able to do a colonoscopy. Radiology may have to be involved to help in procuring tissue for definite diagnosis.  LOS: 3 days   Lucio Litsey 01/17/2014, 8:47 AM

## 2014-01-18 ENCOUNTER — Encounter (HOSPITAL_COMMUNITY)
Admission: EM | Disposition: A | Discharge status: Skilled Nursing Facility | Payer: Self-pay | Source: Home / Self Care | Attending: Internal Medicine

## 2014-01-18 ENCOUNTER — Encounter (HOSPITAL_COMMUNITY): Payer: Self-pay | Admitting: *Deleted

## 2014-01-18 DIAGNOSIS — R1902 Left upper quadrant abdominal swelling, mass and lump: Secondary | ICD-10-CM

## 2014-01-18 DIAGNOSIS — R933 Abnormal findings on diagnostic imaging of other parts of digestive tract: Secondary | ICD-10-CM | POA: Diagnosis present

## 2014-01-18 DIAGNOSIS — R1084 Generalized abdominal pain: Secondary | ICD-10-CM

## 2014-01-18 DIAGNOSIS — K56609 Unspecified intestinal obstruction, unspecified as to partial versus complete obstruction: Secondary | ICD-10-CM

## 2014-01-18 HISTORY — PX: FLEXIBLE SIGMOIDOSCOPY: SHX5431

## 2014-01-18 LAB — GLUCOSE, CAPILLARY
GLUCOSE-CAPILLARY: 109 mg/dL — AB (ref 70–99)
Glucose-Capillary: 132 mg/dL — ABNORMAL HIGH (ref 70–99)
Glucose-Capillary: 108 mg/dL — ABNORMAL HIGH (ref 70–99)
Glucose-Capillary: 109 mg/dL — ABNORMAL HIGH (ref 70–99)

## 2014-01-18 LAB — CA 125: CA 125: 64 U/mL — ABNORMAL HIGH (ref 0.0–30.2)

## 2014-01-18 SURGERY — SIGMOIDOSCOPY, FLEXIBLE
Anesthesia: Moderate Sedation

## 2014-01-18 MED ORDER — MIDAZOLAM HCL 10 MG/2ML IJ SOLN
INTRAMUSCULAR | Status: AC
  Filled 2014-01-18: qty 2

## 2014-01-18 MED ORDER — HYDRALAZINE HCL 20 MG/ML IJ SOLN: 5.0000 mg | Freq: Four times a day (QID) | INTRAMUSCULAR | Status: DC | PRN

## 2014-01-18 MED ORDER — MIDAZOLAM HCL 10 MG/2ML IJ SOLN
INTRAMUSCULAR | Status: DC | PRN
  Administered 2014-01-18: 1 mg via INTRAVENOUS
  Administered 2014-01-18 (×2): 2 mg via INTRAVENOUS

## 2014-01-18 MED ORDER — FENTANYL CITRATE 0.05 MG/ML IJ SOLN
INTRAMUSCULAR | Status: AC
  Filled 2014-01-18: qty 2

## 2014-01-18 MED ORDER — FLEET ENEMA 7-19 GM/118ML RE ENEM
1.0000 | ENEMA | Freq: Once | RECTAL | Status: AC
  Administered 2014-01-18: 1 via RECTAL
  Filled 2014-01-18: qty 1

## 2014-01-18 MED ORDER — FENTANYL CITRATE 0.05 MG/ML IJ SOLN
INTRAMUSCULAR | Status: DC | PRN
  Administered 2014-01-18 (×2): 25 ug via INTRAVENOUS

## 2014-01-18 NOTE — Progress Notes (Signed)
Patient back to unit from Flex sigmoidoscopy procedure. No c/o pain . Alert and oriented x4. Kept comfortable in bed.- Sandie Ano RN

## 2014-01-18 NOTE — Progress Notes (Signed)
Karen Arias   DOB:[birth year]   NO#:[id number]    Subjective: She tolerated sigmoidoscopy well. Pain in her abdomen is under control. She has mild nausea. Denies fevers or chills.  Objective:  Filed Vitals:   01/18/14 1542  BP: 144/70  Pulse: 66  Temp:   Resp: 19    No intake or output data in the 24 hours ending 01/18/14 1608  GENERAL:alert, no distress and comfortable SKIN: skin color, texture, turgor are normal, no rashes or significant lesions EYES: normal, Conjunctiva are pink and non-injected, sclera clear ABDOMEN:abdomen soft, mild tenderness in the left upper quadrant, with normal bowel sounds Musculoskeletal:no cyanosis of digits and no clubbing  NEURO: alert & oriented x 3 with fluent speech, no focal motor/sensory deficits   Labs:  Lab Results  Component Value Date   WBC 6.4 01/15/2014   HGB 12.0 01/15/2014   HCT 37.2 01/15/2014   MCV 90.7 01/15/2014   PLT 225 01/15/2014   NEUTROABS 4.5 01/14/2014    Lab Results  Component Value Date   NA 131* 01/15/2014   K 3.6* 01/15/2014   CL 93* 01/15/2014   CO2 21 01/15/2014   I have review results of sigmoidoscopy.  Assessment & Plan:  #1 abdominal pain, nausea and constipation, consistent with bowel obstruction from extrinsic compression of colon #2 pancreatic mass extending to the colon with elevated CA 19-9, likely locally advanced pancreatic cancer until proven otherwise  #3 anorexia with recent weight loss  Her pain appears to be under control now. She may need diversion colostomy before any form of palliative chemotherapy can be attempted.  I discussed with the patient the reason behind diversion colostomy. I spoke with general surgery team about this. The patient wants to think about it. I discussed with her and her family about the prognosis in general regarding the role of palliative chemotherapy. I address all questions and concerns. I will return tomorrow to follow. I spent 40 minutes with the all on counseling and review  of test results.  [facility], [place], MD 01/18/2014  4:08 PM

## 2014-01-18 NOTE — Progress Notes (Signed)
Patient ID: Karen Arias, female   DOB: [birth year], 80 y.o.   MRN: [id number]    Subjective: No change, some LUQ pain, nausea without vomiting  Objective: Vital signs in last 24 hours: Temp:  [98 F (36.7 C)-98.3 F (36.8 C)] 98.3 F (36.8 C) (09/08 0744) Pulse Rate:  [56-91] 56 (09/08 0744) Resp:  [16-20] 16 (09/08 0744) BP: (150-154)/(53-88) 150/53 mmHg (09/08 0744) SpO2:  [98 %-100 %] 98 % (09/08 0744) Weight:  [123 lb 10.9 oz (56.1 kg)] 123 lb 10.9 oz (56.1 kg) (09/08 0744) Last BM Date: 01/17/14 (pt reports X 2 stools, watery and brown in color)  Intake/Output from previous day:   Intake/Output this shift:    General appearance: alert, cooperative and no distress GI: Moderately distended, mild tenderness in LUQ, no guarding, no palpable mass  Lab Results:  No results found for this basename: WBC, HGB, HCT, PLT,  in the last 72 hours BMET No results found for this basename: NA, K, CL, CO2, GLUCOSE, BUN, CREATININE, CALCIUM,  in the last 72 hours   Studies/Results: Dg Abd 2 Views  01-28-2014   CLINICAL DATA:  Abdominal pain and constipation. Partial colonic obstruction.  EXAM: ABDOMEN - 2 VIEW  COMPARISON:  One day prior  FINDINGS: Upright and supine views. The upright view demonstrates no free intraperitoneal air or significant air-fluid levels. The supine view demonstrates contrast within normal caliber hepatic flexure and transverse colon. Small volume sigmoid gas identified. No significant small bowel distension.  IMPRESSION: Similar bowel gas pattern with contrast within normal caliber transverse colon. No evidence of bowel obstruction.   Electronically Signed   By: Abigail Miyamoto M.D.   On: 01-28-2014 13:00    Anti-infectives: Anti-infectives   None      Assessment/Plan: Large LUQ mass with partial colonic obstruction.  CT personally reviewed,  With markedly elevated CA 19-9 this would appear to be a pancreatic primary.  Colonoscopy should help sort this out.  If  pancreatic primary I would not recommend resection, I think morbidity would outweigh benefits.  If colonic (doubt this) would consider resection though still high risk.  She likely will need at least a diverting colostomy    LOS: 4 days    Rajendra Spiller T 01/18/2014

## 2014-01-18 NOTE — Progress Notes (Signed)
Patient ID: ARYN Arias, female   DOB: 1929-01-13, 80 y.o.   MRN: [id number] [facility] PROGRESS NOTE  CAROLYNE WHITSEL URK:[id number] DOB: 03/03/1929 DOA: 01/14/2014 PCP: [name], MD  Brief narrative: 80 yo pleasant female with history of hypertension, hiatal hernia (EGD in 09/2012) who followed with Dr. [name] for GI related issues. This time she presented with abdominal pain and constipation. She was found to have LUQ soft tissue mass (5.7 cm in diameter) on CT abdomen causing obstruction of the colon at the level of the splenic flexure, worrisome for malignancy. Her tumor marker CA 19-9 elevated so likely pancreatic cancer. Colonoscopy prep attempted but patient could not tolerate well. Plan for flexible sigmoidoscopy today.  Assessment/Plan:   Principal Problem:  Left upper quadrant mass / colonic obstruction worrisome for pancreatic cancer Left upper quadrant mass worrisome for malignancy. CEA WNL. CA 19-9 3434. CA 125 is 64. Plan was for colonoscopy however patient did not tolerate the prep. Likely partial near complete colon obstruction probably from extrinsic compression from pancreatic tail cancer / mass.  Pt was seen by oncology, Dr. [name] who spoke to the patient and her family about possible palliative chemotherapy. Due to advanced age she is likely high risk patient for surgical resection of a mass. Dr. [name] will see the pt again once flexible sigmoidoscopy completed NPO for flex sigmoidoscopy today.  Active Problems:  Essential hypertension  Continue home medications: Maxzide and Lisinopril. If P still high may use hydralazine 5 mg IV every 6 hours PRN.   Diabetes mellitus  Continue SSI for now CBG's in past 24 hours: 114, 132, 109 DVT Prophylaxis  Lovenox sub Q while pt in hospital.    Code Status: Full.  Family Communication: plan of care discussed with the patient anf her daughter at the bedside  Disposition Plan: Home when stable.   IV  Access:   Peripheral IV Procedures and diagnostic studies:   Ct Abdomen Pelvis W Contrast 01/14/2014 Left upper quadrant soft tissue mass causing obstruction of the colon at the level of the splenic flexure. The mass measures approximately 5.7 cm in greatest dimensions. Exact origin of the tumor is not entirely clear by CT. 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 Second most likely origin would be the colon. No associated colonic perforation is identified by CT. No associated enlarged lymph nodes or distant metastatic lesions are identified in the abdomen or pelvis.  Dg Abd Acute W/chest 01/14/2014 Negative abdominal radiographs. No acute cardiopulmonary disease.  Dg Abd 2 Views 01/15/2014 : No evidence of small bowel obstruction or free air.  Dg Abd 01/16/2014: no evidence of bowel obstruction  Medical Consultants:   Surgery  Gastroenterology (Dr. Collene Mares, Jothi) Oncology (Dr. Heath Lark) Other Consultants:   None  Anti-Infectives:   None     [name], MD  [facility] Pager [phone]  If 7PM-7AM, please contact night-coverage [email or web address] Password [email or web address] 01/18/2014, 2:22 PM   LOS: 4 days    HPI/Subjective: No acute overnight events.  Objective: Filed Vitals:   01/17/14 1403 01/17/14 2142 01/18/14 0744 01/18/14 1407  BP: 154/71 151/88 150/53 155/63  Pulse: 91 84 56 62  Temp: 98.2 F (36.8 C) 98 F (36.7 C) 98.3 F (36.8 C) 98 F (36.7 C)  TempSrc: Oral Oral Oral Oral  Resp: 16 20 16 16   Height:      Weight:   56.1 kg (123 lb 10.9 oz)   SpO2: 98% 100% 98%  99%   No intake or output data in the 24 hours ending 01/18/14 1422  Exam:   General:  Pt is alert, not in acute distress  Cardiovascular: Regular rate and rhythm, S1/S2, no murmurs  Respiratory: no wheezing, no rhonchi   Abdomen: tender in mid abdomen, softly distended, (+) BS  Extremities: No edema, pulses DP and PT palpable bilaterally  Neuro: Grossly  nonfocal  Data Reviewed: Basic Metabolic Panel:  Recent Labs Lab 01/14/14 1402 01/15/14 0518  NA 134* 131*  K 4.1 3.6*  CL 95* 93*  CO2 25 21  GLUCOSE 114* 148*  BUN 24* 19  CREATININE 1.00 0.85  CALCIUM 9.9 9.3   Liver Function Tests:  Recent Labs Lab 01/14/14 1402  AST 23  ALT 13  ALKPHOS 56  BILITOT 0.4  PROT 8.1  ALBUMIN 3.9    Recent Labs Lab 01/14/14 1402  LIPASE 32   No results found for this basename: AMMONIA,  in the last 168 hours CBC:  Recent Labs Lab 01/14/14 1402 01/15/14 0518  WBC 7.0 6.4  NEUTROABS 4.5  --   HGB 12.9 12.0  HCT 38.7 37.2  MCV 91.5 90.7  PLT 233 225   Cardiac Enzymes: No results found for this basename: CKTOTAL, CKMB, CKMBINDEX, TROPONINI,  in the last 168 hours BNP: No components found with this basename: POCBNP,  CBG:  Recent Labs Lab 01/17/14 1214 01/17/14 1659 01/17/14 2141 01/18/14 0745 01/18/14 1218  GLUCAP 122* 113* 114* 132* 109*    No results found for this or any previous visit (from the past 240 hour(s)).   Scheduled Meds: . [MAR HOLD] enoxaparin (LOVENOX) injection  40 mg Subcutaneous Q24H  . [MAR HOLD] feeding supplement (RESOURCE BREEZE)  1 Container Oral TID BM  . [MAR HOLD] insulin aspart  0-9 Units Subcutaneous TID WC  . [MAR HOLD] lisinopril  2.5 mg Oral q morning - 10a  . [MAR HOLD] pantoprazole  40 mg Oral Daily  . [facility] HOLD] triamterene-hydrochlorothiazide  1 tablet Oral q morning - 10a   Continuous Infusions:

## 2014-01-18 NOTE — Progress Notes (Signed)
Enema given x3 in preparation for flexible sigmoidoscopy this afternoon.  Minimal results obtained. Will continue to monitor.

## 2014-01-18 NOTE — Progress Notes (Signed)
[facility] Progress Note  Subjective:  Pt  Still with some LUQ pain. Has had several enemas since admission with little output but says she had some liquid stool last pm. Has some nausea, no vomiting.   Objective:  Vital signs in last 24 hours: Temp:  [98 F (36.7 C)-98.3 F (36.8 C)] 98.3 F (36.8 C) (09/08 0744) Pulse Rate:  [56-91] 56 (09/08 0744) Resp:  [16-20] 16 (09/08 0744) BP: (150-154)/(53-88) 150/53 mmHg (09/08 0744) SpO2:  [98 %-100 %] 98 % (09/08 0744) Weight:  [56.1 kg (123 lb 10.9 oz)] 56.1 kg (123 lb 10.9 oz) (09/08 0744) Last BM Date: 01/17/14 General:   Alert,  Well-developed,    in NAD Heart:  Regular rate and rhythm; no murmurs Pulm lungs clear bilat Abdomen:  Soft, nontender and nondistended. Normal bowel sounds, without guarding, and without rebound.   Extremities:  Without edema. Neurologic:  Alert and  oriented x4;  grossly normal neurologically. Psych:  Alert and cooperative. Normal mood and affect.  LABS:    CA 19-9 <35.0 U/mL  3433.9 (H)    CA 125 0.0 - 30.2 U/mL  64.0 (H)     IMAGING:   Dg Abd 2 Views  01/16/2014   CLINICAL DATA:  Abdominal pain and constipation. Partial colonic obstruction.  EXAM: ABDOMEN - 2 VIEW  COMPARISON:  One day prior  FINDINGS: Upright and supine views. The upright view demonstrates no free intraperitoneal air or significant air-fluid levels. The supine view demonstrates contrast within normal caliber hepatic flexure and transverse colon. Small volume sigmoid gas identified. No significant small bowel distension.  IMPRESSION: Similar bowel gas pattern with contrast within normal caliber transverse colon. No evidence of bowel obstruction.   Electronically Signed   By: Abigail Miyamoto M.D.   On: 01/16/2014 13:00   Study Result    CLINICAL DATA: Abdominal pain and constipation.  EXAM:  CT ABDOMEN AND PELVIS WITH CONTRAST  TECHNIQUE:  Multidetector CT imaging of the abdomen and pelvis was performed  using  the standard protocol following bolus administration of  intravenous contrast.  CONTRAST: 108mL OMNIPAQUE IOHEXOL 300 MG/ML SOLN, 110mL OMNIPAQUE  IOHEXOL 300 MG/ML SOLN  COMPARISON: 08/20/2012  FINDINGS:  Visualized lower chest again demonstrates changes related to prior  myocardial infarction with an apical ventricular aneurysm again  identified.  The liver shows steatosis. No hepatic masses or biliary obstruction  identified. The gallbladder is unremarkable.  The spleen, pancreas, adrenal glands and kidneys are unremarkable.  There is significant fecal material noted with colonic distention  beginning at the level of the cecum and continuing to the level of  the splenic flexure. At the level of the splenic flexure, there is  caliber change of the colon with decompression of the descending  colon and distal colon. Amorphous, lobulated soft tissue in this  region measures roughly 5.1 x 5.3 x 5.7 cm in greatest dimensions  and is very suspicious for an obstructing tumor. It is difficult to  identify whether this tumor is originating from the tail of the  pancreas or the colon. The lobulated soft tissue abuts the tail of  the pancreas, the posterior wall of the stomach, the colon and the  splenic hilum. It also nearly abuts the left kidney. Tumor origin is  suspected to be most likely the pancreatic tail followed by the  colon. This was not present on the prior CT in appears to be an  aggressive process.  No evidence of bowel perforation  or abnormal fluid collection. No  enlarged lymph nodes are seen. The bladder is distended. No hernias  are seen. Bony structures are unremarkable.  IMPRESSION:  Left upper quadrant soft tissue mass causing obstruction of the  colon at the level of the splenic flexure. The mass measures  approximately 5.7 cm in greatest dimensions. Exact origin of the  tumor is not entirely clear by CT. However, this is favored to  represent a pancreatic tail carcinoma  invading the colon. Second  most likely origin would be the colon. No associated colonic  perforation is identified by CT. No associated enlarged lymph nodes  or distant metastatic lesions are identified in the abdomen or  pelvis.  Critical Value/emergent results were called by telephone at the time  of interpretation on 01/14/2014 at 5:55 pm to Dr. Tomi Bamberger , who verbally  acknowledged these results.  Electronically Signed  By: Aletta Edouard M.D.  On: 01/14/2014 17:58      Assessment / Plan: 80 yo female with LUQ mass with partial colonic obstruction. CA 19-9 markedly elevated, suggestive of a pancreatic primary.  Pt has been unable to tolerate a bowel prep over the weekend. As mass is in LUQ, pt to  Undergo a flex sig this afternoon for further evaluation of the mass.If no colonic involvement, pt to be scheduled for surgery.     LOS: 4 days   Hvozdovic, Lori P PA-C 01/18/2014,   GI ATTENDING  Patient seen and examined. Agree with above. We will attempt endoscopic evaluation of the left colon today. If unable to accomplish, may need surgery straightaway.  Docia Chuck. Geri Seminole., M.D. [facility] Division of Gastroenterology

## 2014-01-18 NOTE — Op Note (Signed)
[facility] [place], [address]   FLEXIBLE SIGMOIDOSCOPY PROCEDURE REPORT  PATIENT: Karen Arias, Karen Arias  MR#: [id number] BIRTHDATE: Sep 09, 1928 , 85  yrs. old GENDER: Female ENDOSCOPIST: Eustace Quail, MD REFERRED BY: Excell Seltzer, M.D. PROCEDURE DATE:  01/18/2014 PROCEDURE:   Sigmoidoscopy, diagnostic ASA CLASS:   Class II INDICATIONS:an abnormal CT. MEDICATIONS: Fentanyl 50 mcg IV and Versed 5 mg IV  DESCRIPTION OF PROCEDURE:   After the risks benefits and alternatives of the procedure were thoroughly explained, informed consent was obtained.  revealed no abnormalities of the rectum. The Colonoscope [id number]  endoscope was introduced through the anus  and advanced to the splenic flexure , limited by No adverse events experienced.   The quality of the prep was good .  The instrument was then slowly withdrawn as the mucosa was fully examined.     COLON FINDINGS: The colonoscope was advanced to approximately the region of the splenic flexure.  There was stenosis of the colon with hyperemic mucosa suggesting extrinsic compression.  The scope would not pass beyond.  No obvious primary mucosal lesion. Left-sided diverticulosis present. Retroflexed views revealed internal hemorrhoid.    The scope was then withdrawn from the patient and the procedure terminated.  COMPLICATIONS: There were no complications.  ENDOSCOPIC IMPRESSION: The colonoscope was advanced to approximately the region of the splenic flexure.  There was stenosis of the colon with hyperemic mucosa suggesting extrinsic compression.  The scope would not pass beyond.  No obvious primary mucosal lesion.  Left-sided diverticulosis present.  RECOMMENDATIONS: 1. Surgery   REPEAT EXAM:   _______________________________ eSignedEustace Quail, MD 01/18/2014 3:10 PM   [id number] Hoxworth, MD Zenovia Jarred MD Lorene Dy, MD

## 2014-01-19 ENCOUNTER — Encounter (HOSPITAL_COMMUNITY): Payer: Self-pay | Admitting: Internal Medicine

## 2014-01-19 DIAGNOSIS — N179 Acute kidney failure, unspecified: Secondary | ICD-10-CM

## 2014-01-19 LAB — BASIC METABOLIC PANEL
ANION GAP: 15 (ref 5–15)
BUN: 34 mg/dL — ABNORMAL HIGH (ref 6–23)
CALCIUM: 9.8 mg/dL (ref 8.4–10.5)
CO2: 22 mEq/L (ref 19–32)
CREATININE: 1.34 mg/dL — AB (ref 0.50–1.10)
Chloride: 94 mEq/L — ABNORMAL LOW (ref 96–112)
GFR calc Af Amer: 41 mL/min — ABNORMAL LOW (ref >90)
GFR calc non Af Amer: 36 mL/min — ABNORMAL LOW (ref >90)
GLUCOSE: 120 mg/dL — AB (ref 70–99)
Potassium: 4 mEq/L (ref 3.7–5.3)
SODIUM: 131 meq/L — AB (ref 137–147)

## 2014-01-19 LAB — CBC
HCT: 36.5 % (ref 36.0–46.0)
Hemoglobin: 12.2 g/dL (ref 12.0–15.0)
MCH: 29.7 pg (ref 26.0–34.0)
MCHC: 33.4 g/dL (ref 30.0–36.0)
MCV: 88.8 fL (ref 78.0–100.0)
PLATELETS: 239 10*3/uL (ref 150–400)
RBC: 4.11 MIL/uL (ref 3.87–5.11)
RDW: 12.8 % (ref 11.5–15.5)
WBC: 3.9 10*3/uL — ABNORMAL LOW (ref 4.0–10.5)

## 2014-01-19 LAB — GLUCOSE, CAPILLARY
GLUCOSE-CAPILLARY: 105 mg/dL — AB (ref 70–99)
Glucose-Capillary: 129 mg/dL — ABNORMAL HIGH (ref 70–99)
Glucose-Capillary: 104 mg/dL — ABNORMAL HIGH (ref 70–99)
Glucose-Capillary: 116 mg/dL — ABNORMAL HIGH (ref 70–99)

## 2014-01-19 LAB — SURGICAL PCR SCREEN
MRSA, PCR: NEGATIVE
Staphylococcus aureus: NEGATIVE

## 2014-01-19 MED ORDER — SODIUM CHLORIDE 0.9 % IV SOLN
INTRAVENOUS | Status: DC
  Administered 2014-01-19: 10:00:00 via INTRAVENOUS
  Administered 2014-01-20: 1000 mL via INTRAVENOUS
  Administered 2014-01-20 – 2014-01-21 (×3): via INTRAVENOUS
  Administered 2014-01-21: 1000 mL via INTRAVENOUS
  Administered 2014-01-22 (×2): via INTRAVENOUS

## 2014-01-19 MED ORDER — DEXTROSE 5 % IV SOLN
2.0000 g | INTRAVENOUS | Status: AC
  Administered 2014-01-20: 2 g via INTRAVENOUS
  Filled 2014-01-19: qty 2

## 2014-01-19 MED ORDER — ENOXAPARIN SODIUM 30 MG/0.3ML ~~LOC~~ SOLN
30.0000 mg | SUBCUTANEOUS | Status: DC
  Administered 2014-01-20 – 2014-01-22 (×3): 30 mg via SUBCUTANEOUS
  Filled 2014-01-19 (×5): qty 0.3

## 2014-01-19 NOTE — Progress Notes (Signed)
Karen Arias   DOB:03/31/1929   FF#:[id number]    Subjective: Her symptoms are about the same. She said she has persistent abdominal pain. She denies any nausea. She remains constipated.  Objective:  Filed Vitals:   01/19/14 1408  BP: 127/66  Pulse: 81  Temp: 98 F (36.7 C)  Resp: 16     Intake/Output Summary (Last 24 hours) at 01/19/14 1649 Last data filed at 01/19/14 1409  Gross per 24 hour  Intake    833 ml  Output      0 ml  Net    833 ml    GENERAL:alert, no distress and comfortable SKIN: skin color, texture, turgor are normal, no rashes or significant lesions ABDOMEN:abdomen soft, mild tenderness in the epigastrium region with normal bowel sounds Musculoskeletal:no cyanosis of digits and no clubbing  NEURO: alert & oriented x 3 with fluent speech, no focal motor/sensory deficits   Labs:  Lab Results  Component Value Date   WBC 3.9* 01/19/2014   HGB 12.2 01/19/2014   HCT 36.5 01/19/2014   MCV 88.8 01/19/2014   PLT 239 01/19/2014   NEUTROABS 4.5 01/14/2014    Lab Results  Component Value Date   NA 131* 01/19/2014   K 4.0 01/19/2014   CL 94* 01/19/2014   CO2 22 01/19/2014   Assessment & Plan:  #1 abdominal pain, nausea and constipation, consistent with bowel obstruction from extrinsic compression of colon #2 pancreatic mass extending to the colon with elevated CA 19-9, likely locally advanced pancreatic cancer until proven otherwise  #3 anorexia with recent weight loss  Her pain appears to be under control now. She may need diversion colostomy before any form of palliative chemotherapy can be attempted.  I discussed with the patient the reason behind diversion colostomy. I discussed with her and her family about the prognosis in general regarding the role of palliative chemotherapy. She wants to get chemotherapy after surgery. I will get thesurgeon to put in a port. I address all questions and concerns. I will return next week to follow    [facility], [place], MD 01/19/2014  4:49  PM

## 2014-01-19 NOTE — Progress Notes (Signed)
Progress Note   Karen Arias [id number]:[id number] DOB: 06/02/1928 DOA: 01/14/2014 PCP: [name], MD   Brief Narrative:   Karen Arias is an 80 y.o. female with a PMH of hypertension, hiatal hernia (EGD in 09/2012) who followed with Dr. [name] for GI related issues who was admitted 01/14/14 with chief complaint of abdominal pain and constipation. She was found to have LUQ soft tissue mass (5.7 cm in diameter) on CT abdomen causing obstruction of the colon at the level of the splenic flexure, worrisome for malignancy. Her tumor marker CA 19-9 elevated, concerning for pancreatic cancer. Colonoscopy prep attempted but patient could not tolerate well. Underwent a flexible sigmoidoscopy 01/18/14 which showed stenosis of the colon at the splenic flexure with extrinsic compression, recommendations for surgery made.   Assessment/Plan:   Principal Problem:  Left upper quadrant mass / colonic obstruction worrisome for pancreatic cancer   Left upper quadrant mass worrisome for malignancy. CEA WNL. CA 19-9 3434. CA 125 is 64.   Plan was for colonoscopy however patient did not tolerate the prep. Likely partial near complete colon obstruction probably from extrinsic compression from pancreatic tail cancer / mass.   Pt was seen by oncology, Dr. [name] who spoke to the patient and her family about possible palliative chemotherapy. Due to advanced age she is likely high risk patient for surgical resection of a mass.   Status post flexible sigmoidoscopy 01/18/14 showing stenosis of the colon at the splenic flexure with extrinsic compression.  Active Problems:  Acute kidney injury  Creatinine bump noted. Possibly secondary to contrast nephropathy. Will hold lisinopril/Maxide and gently hydrate.  Essential hypertension   Resume home medications when renal function stable: Maxzide and Lisinopril. Continue hydralazine 5 mg IV every 6 hours PRN.   Diabetes mellitus   Currently on insulin  sensitive SSI.  CBG range 108-132  DVT Prophylaxis   Continue Lovenox.    Code Status: Full. Family Communication: Daughter updated at the bedside. Disposition Plan: Home when stable.   IV Access:    Peripheral IV   Procedures and diagnostic studies:   Ct Abdomen Pelvis W Contrast 01/14/2014 Left upper quadrant soft tissue mass causing obstruction of the colon at the level of the splenic flexure. The mass measures approximately 5.7 cm in greatest dimensions. Exact origin of the tumor is not entirely clear by CT. 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 Second most likely origin would be the colon. No associated colonic perforation is identified by CT. No associated enlarged lymph nodes or distant metastatic lesions are identified in the abdomen or pelvis.   Dg Abd Acute W/chest 01/14/2014 Negative abdominal radiographs. No acute cardiopulmonary disease.   Dg Abd 2 Views 01/15/2014 : No evidence of small bowel obstruction or free air.   Dg Abd 01/16/2014: No evidence of bowel obstruction.  Flexible sigmoidoscopy 01/18/14: The colonoscope was advanced to approximately the region of the splenic flexure. There was stenosis of the colon with hyperemic mucosa suggesting extrinsic compression. The scope would not pass  beyond. No obvious primary mucosal lesion. Left-sided diverticulosis present.  Medical Consultants:    Dr. Heath Lark, Oncology.  Dr. Scarlette Shorts, GI.  Dr. Excell Seltzer, Surgery.   Other Consultants:    None.   Anti-Infectives:    None.  Subjective:   Karen Arias has not moved her bowels in a week. No current complaints of nausea or vomiting. No significant pain, although she does have  some intermittent upper abdominal pain at times.  Objective:    Filed Vitals:   01/18/14 1525 01/18/14 1542 01/18/14 2030 01/19/14 0600  BP: 114/63 144/70 134/61 133/70  Pulse: 95 66 82 82  Temp:   97.9 F (36.6 C)  97.7 F (36.5 C)  TempSrc:   Oral Oral  Resp: 20 19 18 18   Height:      Weight:    56.8 kg (125 lb 3.5 oz)  SpO2: 98% 100% 99% 99%    Intake/Output Summary (Last 24 hours) at 01/19/14 0758 Last data filed at 01/18/14 1756  Gross per 24 hour  Intake    120 ml  Output      0 ml  Net    120 ml    Exam: Gen:  NAD Cardiovascular:  RRR, No M/R/G Respiratory:  Lungs CTAB Gastrointestinal:  Abdomen softly distended, + BS Extremities:  1+ edema   Data Reviewed:    Labs: Basic Metabolic Panel:  Recent Labs Lab 01/14/14 1402 01/15/14 0518 01/19/14 0353  NA 134* 131* 131*  K 4.1 3.6* 4.0  CL 95* 93* 94*  CO2 25 21 22   GLUCOSE 114* 148* 120*  BUN 24* 19 34*  CREATININE 1.00 0.85 1.34*  CALCIUM 9.9 9.3 9.8   GFR Estimated Creatinine Clearance: 25.2 ml/min (by C-G formula based on Cr of 1.34). Liver Function Tests:  Recent Labs Lab 01/14/14 1402  AST 23  ALT 13  ALKPHOS 56  BILITOT 0.4  PROT 8.1  ALBUMIN 3.9    Recent Labs Lab 01/14/14 1402  LIPASE 32   CBC:  Recent Labs Lab 01/14/14 1402 01/15/14 0518 01/19/14 0353  WBC 7.0 6.4 3.9*  NEUTROABS 4.5  --   --   HGB 12.9 12.0 12.2  HCT 38.7 37.2 36.5  MCV 91.5 90.7 88.8  PLT 233 225 239   CBG:  Recent Labs Lab 01/17/14 2141 01/18/14 0745 01/18/14 1218 01/18/14 1650 01/18/14 2137  GLUCAP 114* 132* 109* 108* 109*   Microbiology No results found for this or any previous visit (from the past 240 hour(s)).   Medications:   . enoxaparin (LOVENOX) injection  30 mg Subcutaneous Q24H  . feeding supplement (RESOURCE BREEZE)  1 Container Oral TID BM  . insulin aspart  0-9 Units Subcutaneous TID WC  . lisinopril  2.5 mg Oral q morning - 10a  . pantoprazole  40 mg Oral Daily  . triamterene-hydrochlorothiazide  1 tablet Oral q morning - 10a   Continuous Infusions:   Time spent: 35 minutes with > 50% of time discussing current diagnostic test results, clinical impression and plan of care.     LOS: 5 days   [facility] Pager [phone]. If unable to reach me by pager, please call my cell phone at [phone].  *Please refer to [email or web address], password [email or web address] to get updated schedule on who will round on this patient, as hospitalists switch teams weekly. If 7PM-7AM, please contact night-coverage at [email or web address], password [email or web address] for any overnight needs.  01/19/2014, 7:58 AM

## 2014-01-20 ENCOUNTER — Inpatient Hospital Stay (HOSPITAL_COMMUNITY): Payer: Medicare HMO

## 2014-01-20 ENCOUNTER — Inpatient Hospital Stay (HOSPITAL_COMMUNITY): Payer: Medicare HMO | Admitting: Anesthesiology

## 2014-01-20 ENCOUNTER — Encounter (HOSPITAL_COMMUNITY)
Admission: EM | Disposition: A | Discharge status: Skilled Nursing Facility | Payer: Self-pay | Source: Home / Self Care | Attending: Internal Medicine

## 2014-01-20 ENCOUNTER — Encounter (HOSPITAL_COMMUNITY): Payer: Medicare HMO | Admitting: Anesthesiology

## 2014-01-20 HISTORY — PX: PORTACATH PLACEMENT: SHX2246

## 2014-01-20 HISTORY — PX: COLOSTOMY TAKEDOWN: SHX5258

## 2014-01-20 LAB — BASIC METABOLIC PANEL
ANION GAP: 16 — AB (ref 5–15)
BUN: 35 mg/dL — ABNORMAL HIGH (ref 6–23)
CO2: 19 meq/L (ref 19–32)
Calcium: 9.1 mg/dL (ref 8.4–10.5)
Chloride: 100 mEq/L (ref 96–112)
Creatinine, Ser: 1.13 mg/dL — ABNORMAL HIGH (ref 0.50–1.10)
GFR calc Af Amer: 51 mL/min — ABNORMAL LOW (ref >90)
GFR calc non Af Amer: 44 mL/min — ABNORMAL LOW (ref >90)
Glucose, Bld: 108 mg/dL — ABNORMAL HIGH (ref 70–99)
Potassium: 3.8 mEq/L (ref 3.7–5.3)
SODIUM: 135 meq/L — AB (ref 137–147)

## 2014-01-20 LAB — GLUCOSE, CAPILLARY
GLUCOSE-CAPILLARY: 107 mg/dL — AB (ref 70–99)
GLUCOSE-CAPILLARY: 130 mg/dL — AB (ref 70–99)
Glucose-Capillary: 101 mg/dL — ABNORMAL HIGH (ref 70–99)
Glucose-Capillary: 126 mg/dL — ABNORMAL HIGH (ref 70–99)
Glucose-Capillary: 146 mg/dL — ABNORMAL HIGH (ref 70–99)

## 2014-01-20 SURGERY — CLOSURE, COLOSTOMY, LAPAROSCOPIC
Anesthesia: General | Site: Chest

## 2014-01-20 MED ORDER — FENTANYL CITRATE 0.05 MG/ML IJ SOLN
INTRAMUSCULAR | Status: AC
  Filled 2014-01-20: qty 2

## 2014-01-20 MED ORDER — OXYCODONE-ACETAMINOPHEN 5-325 MG PO TABS
1.0000 | ORAL_TABLET | ORAL | Status: DC | PRN
  Administered 2014-01-23 – 2014-02-01 (×10): 2 via ORAL
  Filled 2014-01-20 (×12): qty 2

## 2014-01-20 MED ORDER — HYDROMORPHONE HCL PF 2 MG/ML IJ SOLN
2.0000 mg | INTRAMUSCULAR | Status: DC | PRN
  Administered 2014-01-20 – 2014-01-31 (×25): 2 mg via INTRAVENOUS
  Filled 2014-01-20 (×27): qty 1

## 2014-01-20 MED ORDER — LACTATED RINGERS IV SOLN
INTRAVENOUS | Status: DC | PRN
  Administered 2014-01-20 (×2): via INTRAVENOUS

## 2014-01-20 MED ORDER — PHENYLEPHRINE HCL 10 MG/ML IJ SOLN
10.0000 mg | INTRAVENOUS | Status: DC | PRN
  Administered 2014-01-20: 100 ug/min via INTRAVENOUS

## 2014-01-20 MED ORDER — ONDANSETRON HCL 4 MG/2ML IJ SOLN
INTRAMUSCULAR | Status: AC
  Filled 2014-01-20: qty 2

## 2014-01-20 MED ORDER — DEXAMETHASONE SODIUM PHOSPHATE 10 MG/ML IJ SOLN
INTRAMUSCULAR | Status: DC | PRN
  Administered 2014-01-20: 10 mg via INTRAVENOUS

## 2014-01-20 MED ORDER — CEFOTETAN DISODIUM-DEXTROSE 2-2.08 GM-% IV SOLR
INTRAVENOUS | Status: AC
  Filled 2014-01-20: qty 50

## 2014-01-20 MED ORDER — SODIUM CHLORIDE 0.9 % IJ SOLN
INTRAMUSCULAR | Status: AC
  Filled 2014-01-20: qty 3

## 2014-01-20 MED ORDER — ETOMIDATE 2 MG/ML IV SOLN
INTRAVENOUS | Status: DC | PRN
  Administered 2014-01-20: 12 mg via INTRAVENOUS

## 2014-01-20 MED ORDER — FENTANYL CITRATE 0.05 MG/ML IJ SOLN
INTRAMUSCULAR | Status: AC
  Filled 2014-01-20: qty 5

## 2014-01-20 MED ORDER — BUPIVACAINE-EPINEPHRINE 0.25% -1:200000 IJ SOLN
INTRAMUSCULAR | Status: DC | PRN
  Administered 2014-01-20: 8 mL

## 2014-01-20 MED ORDER — FENTANYL CITRATE 0.05 MG/ML IJ SOLN
25.0000 ug | INTRAMUSCULAR | Status: DC | PRN
  Administered 2014-01-20 (×2): 50 ug via INTRAVENOUS

## 2014-01-20 MED ORDER — PROPOFOL 10 MG/ML IV BOLUS
INTRAVENOUS | Status: DC | PRN
  Administered 2014-01-20: 60 mg via INTRAVENOUS

## 2014-01-20 MED ORDER — HEPARIN SOD (PORK) LOCK FLUSH 100 UNIT/ML IV SOLN
INTRAVENOUS | Status: DC | PRN
  Administered 2014-01-20: 500 [IU] via INTRAVENOUS

## 2014-01-20 MED ORDER — CHLORHEXIDINE GLUCONATE CLOTH 2 % EX PADS
6.0000 | MEDICATED_PAD | Freq: Once | CUTANEOUS | Status: AC
  Administered 2014-01-20: 6 via TOPICAL

## 2014-01-20 MED ORDER — PHENYLEPHRINE HCL 10 MG/ML IJ SOLN
INTRAMUSCULAR | Status: DC | PRN
  Administered 2014-01-20 (×5): 80 ug via INTRAVENOUS

## 2014-01-20 MED ORDER — ROCURONIUM BROMIDE 100 MG/10ML IV SOLN
INTRAVENOUS | Status: AC
  Filled 2014-01-20: qty 1

## 2014-01-20 MED ORDER — LIDOCAINE HCL (CARDIAC) 20 MG/ML IV SOLN
INTRAVENOUS | Status: DC | PRN
  Administered 2014-01-20: 100 mg via INTRAVENOUS

## 2014-01-20 MED ORDER — LIDOCAINE HCL (CARDIAC) 20 MG/ML IV SOLN
INTRAVENOUS | Status: AC
  Filled 2014-01-20: qty 5

## 2014-01-20 MED ORDER — ETOMIDATE 2 MG/ML IV SOLN
INTRAVENOUS | Status: AC
  Filled 2014-01-20: qty 10

## 2014-01-20 MED ORDER — ONDANSETRON HCL 4 MG/2ML IJ SOLN
INTRAMUSCULAR | Status: DC | PRN
  Administered 2014-01-20: 4 mg via INTRAVENOUS

## 2014-01-20 MED ORDER — DEXAMETHASONE SODIUM PHOSPHATE 10 MG/ML IJ SOLN
INTRAMUSCULAR | Status: AC
  Filled 2014-01-20: qty 1

## 2014-01-20 MED ORDER — BUPIVACAINE-EPINEPHRINE (PF) 0.25% -1:200000 IJ SOLN
INTRAMUSCULAR | Status: AC
  Filled 2014-01-20: qty 30

## 2014-01-20 MED ORDER — SODIUM CHLORIDE 0.9 % IR SOLN
Freq: Once | Status: AC
  Administered 2014-01-20: 15:00:00
  Filled 2014-01-20: qty 1.2

## 2014-01-20 MED ORDER — SUCCINYLCHOLINE CHLORIDE 20 MG/ML IJ SOLN
INTRAMUSCULAR | Status: DC | PRN
  Administered 2014-01-20: 100 mg via INTRAVENOUS

## 2014-01-20 MED ORDER — HEPARIN SOD (PORK) LOCK FLUSH 100 UNIT/ML IV SOLN
INTRAVENOUS | Status: AC
  Filled 2014-01-20: qty 5

## 2014-01-20 MED ORDER — ROCURONIUM BROMIDE 100 MG/10ML IV SOLN
INTRAVENOUS | Status: DC | PRN
  Administered 2014-01-20: 30 mg via INTRAVENOUS

## 2014-01-20 MED ORDER — PROPOFOL 10 MG/ML IV BOLUS
INTRAVENOUS | Status: AC
  Filled 2014-01-20: qty 20

## 2014-01-20 MED ORDER — BUPIVACAINE HCL (PF) 0.5 % IJ SOLN
INTRAMUSCULAR | Status: DC | PRN
  Administered 2014-01-20: 15 mL

## 2014-01-20 MED ORDER — GLYCOPYRROLATE 0.2 MG/ML IJ SOLN
INTRAMUSCULAR | Status: DC | PRN
  Administered 2014-01-20: 0.6 mg via INTRAVENOUS

## 2014-01-20 MED ORDER — BUPIVACAINE HCL (PF) 0.5 % IJ SOLN
INTRAMUSCULAR | Status: AC
  Filled 2014-01-20: qty 30

## 2014-01-20 MED ORDER — FENTANYL CITRATE 0.05 MG/ML IJ SOLN
INTRAMUSCULAR | Status: DC | PRN
  Administered 2014-01-20 (×2): 25 ug via INTRAVENOUS
  Administered 2014-01-20: 50 ug via INTRAVENOUS
  Administered 2014-01-20 (×2): 25 ug via INTRAVENOUS

## 2014-01-20 MED ORDER — NEOSTIGMINE METHYLSULFATE 10 MG/10ML IV SOLN
INTRAVENOUS | Status: DC | PRN
  Administered 2014-01-20: 3 mg via INTRAVENOUS

## 2014-01-20 MED ORDER — SODIUM BICARBONATE 4 % IV SOLN
INTRAVENOUS | Status: AC
  Filled 2014-01-20: qty 5

## 2014-01-20 MED ORDER — PROMETHAZINE HCL 25 MG/ML IJ SOLN: 6.2500 mg | INTRAMUSCULAR | Status: DC | PRN

## 2014-01-20 MED ORDER — LIDOCAINE HCL 0.5 % IJ SOLN
INTRAMUSCULAR | Status: AC
  Filled 2014-01-20: qty 1

## 2014-01-20 SURGICAL SUPPLY — 67 items
APPLIER CLIP ROT 10 11.4 M/L (STAPLE)
BAG DECANTER FOR FLEXI CONT (MISCELLANEOUS) ×3 IMPLANT
BENZOIN TINCTURE PRP APPL 2/3 (GAUZE/BANDAGES/DRESSINGS) ×3 IMPLANT
BLADE HEX COATED 2.75 (ELECTRODE) ×3 IMPLANT
BLADE SURG 15 STRL LF DISP TIS (BLADE) ×2 IMPLANT
BLADE SURG 15 STRL SS (BLADE) ×1
CLIP APPLIE ROT 10 11.4 M/L (STAPLE) IMPLANT
COVER MAYO STAND STRL (DRAPES) ×3 IMPLANT
DECANTER SPIKE VIAL GLASS SM (MISCELLANEOUS) ×3 IMPLANT
DERMABOND ADVANCED (GAUZE/BANDAGES/DRESSINGS) ×2
DERMABOND ADVANCED .7 DNX12 (GAUZE/BANDAGES/DRESSINGS) ×4 IMPLANT
DRAPE C-ARM 42X120 X-RAY (DRAPES) ×3 IMPLANT
DRAPE LAPAROSCOPIC ABDOMINAL (DRAPES) ×3 IMPLANT
DRAPE LAPAROTOMY TRNSV 102X78 (DRAPE) ×3 IMPLANT
DRAPE WARM FLUID 44X44 (DRAPE) ×3 IMPLANT
DRSG TEGADERM 4X4.75 (GAUZE/BANDAGES/DRESSINGS) ×3 IMPLANT
ELECT REM PT RETURN 9FT ADLT (ELECTROSURGICAL) ×3
ELECTRODE REM PT RTRN 9FT ADLT (ELECTROSURGICAL) ×2 IMPLANT
ENDOLOOP SUT PDS II  0 18 (SUTURE)
ENDOLOOP SUT PDS II 0 18 (SUTURE) IMPLANT
GAUZE SPONGE 4X4 12PLY STRL (GAUZE/BANDAGES/DRESSINGS) ×3 IMPLANT
GAUZE SPONGE 4X4 16PLY XRAY LF (GAUZE/BANDAGES/DRESSINGS) ×3 IMPLANT
GLOVE BIOGEL PI IND STRL 7.0 (GLOVE) ×2 IMPLANT
GLOVE BIOGEL PI INDICATOR 7.0 (GLOVE) ×1
GOWN STRL REUS W/TWL LRG LVL3 (GOWN DISPOSABLE) ×3 IMPLANT
GOWN STRL REUS W/TWL XL LVL3 (GOWN DISPOSABLE) ×6 IMPLANT
KIT BASIN OR (CUSTOM PROCEDURE TRAY) ×3 IMPLANT
KIT PORT POWER 8FR ISP CVUE (Catheter) ×3 IMPLANT
LEGGING LITHOTOMY PAIR STRL (DRAPES) ×3 IMPLANT
LIGASURE IMPACT 36 18CM CVD LR (INSTRUMENTS) IMPLANT
LOOP OSTOMY BRIDGE (OSTOMY) ×3 IMPLANT
MARKER SKIN DUAL TIP RULER LAB (MISCELLANEOUS) ×3 IMPLANT
NEEDLE HYPO 22GX1.5 SAFETY (NEEDLE) IMPLANT
NEEDLE HYPO 25X1 1.5 SAFETY (NEEDLE) ×3 IMPLANT
NS IRRIG 1000ML POUR BTL (IV SOLUTION) ×6 IMPLANT
PACK BASIC VI WITH GOWN DISP (CUSTOM PROCEDURE TRAY) ×3 IMPLANT
PENCIL BUTTON HOLSTER BLD 10FT (ELECTRODE) ×3 IMPLANT
SCISSORS LAP 5X35 DISP (ENDOMECHANICALS) ×3 IMPLANT
SEALER TISSUE G2 CVD JAW 35 (ENDOMECHANICALS) ×2 IMPLANT
SEALER TISSUE G2 CVD JAW 45CM (ENDOMECHANICALS) ×1
SET IRRIG TUBING LAPAROSCOPIC (IRRIGATION / IRRIGATOR) IMPLANT
SHEARS HARMONIC ACE PLUS 36CM (ENDOMECHANICALS) ×3 IMPLANT
SPONGE LAP 18X18 X RAY DECT (DISPOSABLE) ×6 IMPLANT
STAPLER VISISTAT 35W (STAPLE) IMPLANT
STRIP CLOSURE SKIN 1/2X4 (GAUZE/BANDAGES/DRESSINGS) IMPLANT
SUT ETHILON 3 0 PS 1 (SUTURE) ×9 IMPLANT
SUT MNCRL AB 4-0 PS2 18 (SUTURE) ×3 IMPLANT
SUT PDS AB 1 CT1 27 (SUTURE) ×3 IMPLANT
SUT PROLENE 2 0 CT2 30 (SUTURE) ×3 IMPLANT
SUT SILK 2 0 (SUTURE) ×1
SUT SILK 2 0 SH CR/8 (SUTURE) ×3 IMPLANT
SUT SILK 2-0 18XBRD TIE 12 (SUTURE) ×2 IMPLANT
SUT SILK 3 0 (SUTURE) ×1
SUT SILK 3 0 SH CR/8 (SUTURE) ×3 IMPLANT
SUT SILK 3-0 18XBRD TIE 12 (SUTURE) ×2 IMPLANT
SUT VIC AB 3-0 SH 18 (SUTURE) ×6 IMPLANT
SYR CONTROL 10ML LL (SYRINGE) ×3 IMPLANT
SYRINGE 10CC LL (SYRINGE) ×6 IMPLANT
TOWEL OR 17X26 10 PK STRL BLUE (TOWEL DISPOSABLE) ×6 IMPLANT
TRAY FOLEY CATH 14FRSI W/METER (CATHETERS) ×3 IMPLANT
TRAY LAP CHOLE (CUSTOM PROCEDURE TRAY) ×3 IMPLANT
TROCAR BLADELESS OPT 5 75 (ENDOMECHANICALS) ×9 IMPLANT
TROCAR XCEL BLUNT TIP 100MML (ENDOMECHANICALS) ×3 IMPLANT
TROCAR XCEL NON-BLD 11X100MML (ENDOMECHANICALS) IMPLANT
TROCAR XCEL UNIV SLVE 11M 100M (ENDOMECHANICALS) IMPLANT
TUBING INSUFFLATION 10FT LAP (TUBING) ×3 IMPLANT
YANKAUER SUCT BULB TIP 10FT TU (MISCELLANEOUS) ×3 IMPLANT

## 2014-01-20 NOTE — Op Note (Signed)
Preoperative Diagnosis: Cancer of the pancreas and obstruction of the left colon  Postoprative Diagnosis: Same  Procedure: Procedure(s): LAPAROSCOPIC COLOSTOMY  INSERTION PORT-A-CATH RIGHT INTERNAL JUGULAR   Surgeon: Excell Seltzer T   Assistants: None  Anesthesia:  General endotracheal anesthesia  Indications: Patient is an 80 year old female who presents with a large mass in the left upper quadrant with markedly elevated CA 19-9 all consistent with cancer of the pancreas. It is resulting in high-grade obstruction of the splenic flexure of the colon with external compression seen on colonoscopy. We have extensively discussed treatment options with the patient and the family and have elected to proceed with diverting colostomy for palliation and placement of Port-A-Cath for radiation sensitizing chemotherapy. I discussed the nature of the procedures and indications and risks of bleeding, infection, anesthetic complications. She understands and agrees to proceed.    Procedure Detail:  Patient was brought to the operating room, placed in the supine position on the operating table, and general ventricular anesthesia induced. She was given preoperative broad-spectrum IV antibiotics. The abdomen was widely sterilely prepped and draped. Patient timeout was performed and correct procedure verified. I made a 1 cm incision at the umbilicus and the midline fascia was exposed and incised for 1 cm and the peritoneum entered under direct vision. Through a mattress suture of 0 Vicryl the Hassan trocar was placed and pneumoperitoneum established. Laparoscopy showed some moderate serous fluid was suctioned. The right and transverse colon was very dilated. The transverse colon was very redundant. I followed that up toward the left upper quadrant but could not see the more distal colon or the mass itself. There was a mass effect behind the stomach. Due to distention visualization was not ideal but The colon  could be well visualized. There was no evidence of carcinomatosis. A 5 mm trocar was placed in the left upper quadrant and using this I found a mobile portion of the distal transverse colon. I traced this back to make sure it was contiguous with a dilated transverse and right colon. This was chosen as the site for the colostomy and the colon was grasped with a locking grasper at this point. The abdomen was then desufflated. I made about a 4 cm transverse incision on either side of the trocar and instruments and the fascia was incised for about 4 cm and the rectus muscle bluntly split. The peritoneum was dilated and the colostomy was then brought out as a loop and secured with a bridge. The colostomy was opened and matured. I passed a pool sucker proximally into the dilated colon and evacuated a large amount of air and liquid stool with significant decompression of the abdomen. Ostomy device was placed.  The patient was repositioned with arms tucked a roll behind her shoulders and the chest and neck were widely sterilely prepped and draped. Patient timeout was again performed. I initially was unable to cannulate the right subclavian vein. The right internal jugular vein was cannulated with a needle and guidewire without difficulty and location confirmed on fluoroscopy. The introducer was then placed over the guidewire and the flush catheter placed via the introducer and its tip positioned in the distal SVC. The introducer was stripped away. A site for the port of the anterior chest wall was chosen and a small subcutaneous pocket created. The catheter was tunneled subcutaneously to the pocket, trimmed to length and attached to the port which was sutured to the anterior chest wall with interrupted 2-0 Prolene. Fluoroscopy showed good position of the  catheter. The incisions were closed with subcutaneous and subcuticular 4-0 Monocryl and Dermabond. The catheter flushed and aspirated easily and was left flushed with  concentrated heparin solution.    Findings: As above  Estimated Blood Loss:  Minimal         Drains: none  Blood Given: none          Specimens: None        Complications:  * No complications entered in OR log *         Disposition: PACU - hemodynamically stable.         Condition: stable

## 2014-01-20 NOTE — Progress Notes (Signed)
Progress Note   Karen Arias [id number] DOB: 05-21-1928 DOA: 01/14/2014 PCP: [name], MD   Brief Narrative:   Karen Arias is an 80 y.o. female with a PMH of hypertension, hiatal hernia (EGD in 09/2012) who followed with Dr. [name] for GI related issues who was admitted 01/14/14 with chief complaint of abdominal pain and constipation. She was found to have LUQ soft tissue mass (5.7 cm in diameter) on CT abdomen causing obstruction of the colon at the level of the splenic flexure, worrisome for malignancy. Her tumor marker CA 19-9 elevated, concerning for pancreatic cancer. Colonoscopy prep attempted but patient could not tolerate well. Underwent a flexible sigmoidoscopy 01/18/14 which showed stenosis of the colon at the splenic flexure with extrinsic compression, recommendations for surgery made.   Assessment/Plan:   Principal Problem:  Left upper quadrant mass causing bowel obstruction, probable pancreatic cancer   Left upper quadrant mass worrisome for malignancy. CEA WNL. CA 19-9 3434. CA 125 is 64.   Pt was seen by oncology, Dr. [name] who spoke to the patient and her family about possible palliative chemotherapy.   Due to advanced age she is likely high risk patient for surgical resection of a mass.   Status post flexible sigmoidoscopy 01/18/14 showing stenosis of the colon at the splenic flexure with extrinsic compression (unable to tolerate prep for full colonoscopy).  Diverting colostomy planned for today.  Active Problems:  Acute kidney injury  Creatinine bump noted 01/19/14. Possibly secondary to contrast nephropathy. BP medications including lisinopril/Maxide are currently being held and the patient is being gently hydrated.  Creatinine improved, but not quite back to baseline, continue to hold lisinopril and Maxzide.  Essential hypertension   Resume home medications when renal function stable: Maxzide and Lisinopril. Continue hydralazine 5 mg  IV every 6 hours PRN.   Diabetes mellitus   Currently on insulin sensitive SSI.  CBG range 101-116.  DVT Prophylaxis   Continue Lovenox.    Code Status: Full. Family Communication: Daughter and multiple other family updated at the bedside. Disposition Plan: Home when stable.   IV Access:    Peripheral IV   Procedures and diagnostic studies:   Ct Abdomen Pelvis W Contrast 01/14/2014 Left upper quadrant soft tissue mass causing obstruction of the colon at the level of the splenic flexure. The mass measures approximately 5.7 cm in greatest dimensions. Exact origin of the tumor is not entirely clear by CT. 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 Second most likely origin would be the colon. No associated colonic perforation is identified by CT. No associated enlarged lymph nodes or distant metastatic lesions are identified in the abdomen or pelvis.   Dg Abd Acute W/chest 01/14/2014 Negative abdominal radiographs. No acute cardiopulmonary disease.   Dg Abd 2 Views 01/15/2014 : No evidence of small bowel obstruction or free air.   Dg Abd 01/16/2014: No evidence of bowel obstruction.  Flexible sigmoidoscopy 01/18/14: The colonoscope was advanced to approximately the region of the splenic flexure. There was stenosis of the colon with hyperemic mucosa suggesting extrinsic compression. The scope would not pass  beyond. No obvious primary mucosal lesion. Left-sided diverticulosis present.  Medical Consultants:    Dr. Heath Lark, Oncology.  Dr. Scarlette Shorts, GI.  Dr. Excell Seltzer, Surgery.   Other Consultants:    None.   Anti-Infectives:    None.  Subjective:   Karen Arias continues to have some generalized abdominal pain in  the upper quadrants. No nausea or vomiting. No flatus or bowel movements. N.p.o. in anticipation of surgery.  Objective:    Filed Vitals:   01/19/14 1408 01/19/14 2000 01/19/14 2122 01/20/14 0433    BP: 127/66  147/68 150/59  Pulse: 81 84 77 46  Temp: 98 F (36.7 C)  98 F (36.7 C) 98.1 F (36.7 C)  TempSrc: Oral  Oral Oral  Resp: 16  18 16   Height:      Weight:    56.3 kg (124 lb 1.9 oz)  SpO2: 96%  100% 98%    Intake/Output Summary (Last 24 hours) at 01/20/14 0750 Last data filed at 01/19/14 2140  Gross per 24 hour  Intake   1213 ml  Output    100 ml  Net   1113 ml    Exam: Gen:  NAD Cardiovascular:  RRR, 2/6 systolic ejection murmur Respiratory:  Lungs CTAB Gastrointestinal:  Abdomen softly distended, + BS Extremities:  1+ edema   Data Reviewed:    Labs: Basic Metabolic Panel:  Recent Labs Lab 01/14/14 1402 01/15/14 0518 01/19/14 0353 01/20/14 0350  NA 134* 131* 131* 135*  K 4.1 3.6* 4.0 3.8  CL 95* 93* 94* 100  CO2 25 21 22 19   GLUCOSE 114* 148* 120* 108*  BUN 24* 19 34* 35*  CREATININE 1.00 0.85 1.34* 1.13*  CALCIUM 9.9 9.3 9.8 9.1   GFR Estimated Creatinine Clearance: 29.8 ml/min (by C-G formula based on Cr of 1.13). Liver Function Tests:  Recent Labs Lab 01/14/14 1402  AST 23  ALT 13  ALKPHOS 56  BILITOT 0.4  PROT 8.1  ALBUMIN 3.9    Recent Labs Lab 01/14/14 1402  LIPASE 32   CBC:  Recent Labs Lab 01/14/14 1402 01/15/14 0518 01/19/14 0353  WBC 7.0 6.4 3.9*  NEUTROABS 4.5  --   --   HGB 12.9 12.0 12.2  HCT 38.7 37.2 36.5  MCV 91.5 90.7 88.8  PLT 233 225 239   CBG:  Recent Labs Lab 01/19/14 1215 01/19/14 1830 01/19/14 2120 01/20/14 0622 01/20/14 0732  GLUCAP 104* 105* 116* 101* 107*   Microbiology Recent Results (from the past 240 hour(s))  SURGICAL PCR SCREEN     Status: None   Collection Time    01/19/14  8:10 PM      Result Value Ref Range Status   MRSA, PCR NEGATIVE  NEGATIVE Final   Staphylococcus aureus NEGATIVE  NEGATIVE Final   Comment:            The Xpert SA Assay (FDA     approved for NASAL specimens     in patients over 55 years of age),     is one component of     a comprehensive  surveillance     program.  Test performance has     been validated by [employer] for patients greater     than or equal to 71 year old.     It is not intended     to diagnose infection nor to     guide or monitor treatment.     Medications:   . cefoTEtan (CEFOTAN) IV  2 g Intravenous On Call to OR  . Chlorhexidine Gluconate Cloth  6 each Topical Once  . enoxaparin (LOVENOX) injection  30 mg Subcutaneous Q24H  . feeding supplement (RESOURCE BREEZE)  1 Container Oral TID BM  . insulin aspart  0-9 Units Subcutaneous TID WC  . pantoprazole  40 mg Oral Daily   Continuous Infusions: . sodium chloride 1,000 mL (01/20/14 0158)    Time spent: 25 minutes.    LOS: 6 days   [facility] Pager [phone]. If unable to reach me by pager, please call my cell phone at [phone].  *Please refer to [email or web address], password [email or web address] to get updated schedule on who will round on this patient, as hospitalists switch teams weekly. If 7PM-7AM, please contact night-coverage at [email or web address], password [email or web address] for any overnight needs.  01/20/2014, 7:50 AM

## 2014-01-20 NOTE — Anesthesia Preprocedure Evaluation (Signed)
Anesthesia Evaluation  Patient identified by MRN, date of birth, ID band Patient awake    Reviewed: Allergy & Precautions, H&P , NPO status , Patient's Chart, lab work & pertinent test results  History of Anesthesia Complications Negative for: history of anesthetic complications  Airway Mallampati: II TM Distance: >3 FB Neck ROM: Full    Dental no notable dental hx.    Pulmonary neg pulmonary ROS,  breath sounds clear to auscultation  Pulmonary exam normal       Cardiovascular hypertension, Pt. on medications + Past MI negative cardio ROS  Rhythm:Regular Rate:Normal     Neuro/Psych Anxiety negative neurological ROS     GI/Hepatic Neg liver ROS, PUD,   Endo/Other  diabetes  Renal/GU Renal diseasenegative Renal ROS  negative genitourinary   Musculoskeletal negative musculoskeletal ROS (+)   Abdominal   Peds negative pediatric ROS (+)  Hematology negative hematology ROS (+)   Anesthesia Other Findings   Reproductive/Obstetrics negative OB ROS                           Anesthesia Physical Anesthesia Plan  ASA: III  Anesthesia Plan: General   Post-op Pain Management:    Induction:   Airway Management Planned: Oral ETT  Additional Equipment:   Intra-op Plan:   Post-operative Plan:   Informed Consent: I have reviewed the patients History and Physical, chart, labs and discussed the procedure including the risks, benefits and alternatives for the proposed anesthesia with the patient or authorized representative who has indicated his/her understanding and acceptance.   Dental advisory given  Plan Discussed with:   Anesthesia Plan Comments:         Anesthesia Quick Evaluation

## 2014-01-20 NOTE — Anesthesia Postprocedure Evaluation (Signed)
  Anesthesia Post-op Note  Patient: Karen Arias  Procedure(s) Performed: Procedure(s) (LRB): LAPAROSCOPIC COLOSTOMY  (N/A) INSERTION PORT-A-CATH RIGHT INTERNAL JUGULAR (N/A)  Patient Location: PACU  Anesthesia Type: General  Level of Consciousness: awake and alert   Airway and Oxygen Therapy: Patient Spontanous Breathing  Post-op Pain: mild  Post-op Assessment: Post-op Vital signs reviewed, Patient's Cardiovascular Status Stable, Respiratory Function Stable, Patent Airway and No signs of Nausea or vomiting  Last Vitals:  Filed Vitals:   01/20/14 1943  BP: 114/57  Pulse: 90  Temp: 37 C  Resp: 18    Post-op Vital Signs: stable   Complications: No apparent anesthesia complications

## 2014-01-20 NOTE — Transfer of Care (Signed)
Immediate Anesthesia Transfer of Care Note  Patient: Karen Arias  Procedure(s) Performed: Procedure(s): LAPAROSCOPIC COLOSTOMY  (N/A) INSERTION PORT-A-CATH RIGHT INTERNAL JUGULAR (N/A)  Patient Location: PACU  Anesthesia Type:General  Level of Consciousness: awake, alert , oriented and patient cooperative  Airway & Oxygen Therapy: Patient Spontanous Breathing and Patient connected to face mask oxygen  Post-op Assessment: Report given to PACU RN and Post -op Vital signs reviewed and stable  Post vital signs: Reviewed and stable  Complications: No apparent anesthesia complications

## 2014-01-21 ENCOUNTER — Encounter (HOSPITAL_COMMUNITY): Payer: Self-pay | Admitting: General Surgery

## 2014-01-21 DIAGNOSIS — R339 Retention of urine, unspecified: Secondary | ICD-10-CM | POA: Diagnosis not present

## 2014-01-21 LAB — CBC
HCT: 32.5 % — ABNORMAL LOW (ref 36.0–46.0)
HEMOGLOBIN: 10.7 g/dL — AB (ref 12.0–15.0)
MCH: 29.6 pg (ref 26.0–34.0)
MCHC: 32.9 g/dL (ref 30.0–36.0)
MCV: 89.8 fL (ref 78.0–100.0)
PLATELETS: 223 10*3/uL (ref 150–400)
RBC: 3.62 MIL/uL — AB (ref 3.87–5.11)
RDW: 12.9 % (ref 11.5–15.5)
WBC: 4 10*3/uL (ref 4.0–10.5)

## 2014-01-21 LAB — BASIC METABOLIC PANEL
Anion gap: 14 (ref 5–15)
BUN: 29 mg/dL — AB (ref 6–23)
CHLORIDE: 99 meq/L (ref 96–112)
CO2: 20 mEq/L (ref 19–32)
Calcium: 8.3 mg/dL — ABNORMAL LOW (ref 8.4–10.5)
Creatinine, Ser: 1.12 mg/dL — ABNORMAL HIGH (ref 0.50–1.10)
GFR calc Af Amer: 51 mL/min — ABNORMAL LOW (ref >90)
GFR calc non Af Amer: 44 mL/min — ABNORMAL LOW (ref >90)
Glucose, Bld: 186 mg/dL — ABNORMAL HIGH (ref 70–99)
POTASSIUM: 4.4 meq/L (ref 3.7–5.3)
SODIUM: 133 meq/L — AB (ref 137–147)

## 2014-01-21 LAB — GLUCOSE, CAPILLARY
GLUCOSE-CAPILLARY: 153 mg/dL — AB (ref 70–99)
GLUCOSE-CAPILLARY: 120 mg/dL — AB (ref 70–99)
Glucose-Capillary: 97 mg/dL (ref 70–99)
Glucose-Capillary: 207 mg/dL — ABNORMAL HIGH (ref 70–99)
Glucose-Capillary: 112 mg/dL — ABNORMAL HIGH (ref 70–99)

## 2014-01-21 MED ORDER — SODIUM CHLORIDE 0.9 % IV BOLUS (SEPSIS)
500.0000 mL | Freq: Once | INTRAVENOUS | Status: AC
  Administered 2014-01-21: 500 mL via INTRAVENOUS

## 2014-01-21 NOTE — Progress Notes (Addendum)
Progress Note   Karen Arias FAO:[id number] DOB: April 16, 1929 DOA: 01/14/2014 PCP: [name], MD   Brief Narrative:   Karen Arias is an 80 y.o. female with a PMH of hypertension, hiatal hernia (EGD in 09/2012) who followed with Dr. [name] for GI related issues who was admitted 01/14/14 with chief complaint of abdominal pain and constipation. She was found to have LUQ soft tissue mass (5.7 cm in diameter) on CT abdomen causing obstruction of the colon at the level of the splenic flexure, worrisome for malignancy. Her tumor marker CA 19-9 elevated, concerning for pancreatic cancer. Colonoscopy prep attempted but patient could not tolerate well. Underwent a flexible sigmoidoscopy 01/18/14 which showed stenosis of the colon at the splenic flexure with extrinsic compression.  Underwent diverting colostomy 01/20/14.   Assessment/Plan:   Principal Problem:  Left upper quadrant mass causing bowel obstruction, probable pancreatic cancer status post diverting colostomy   Left upper quadrant mass worrisome for pancreatic cancer. CEA WNL. CA 19-9 3434. CA 125 is 64.   Pt was seen by oncology, Dr. [name] who spoke to the patient and her family about possible palliative chemotherapy.   Status post flexible sigmoidoscopy 01/18/14 showing stenosis of the colon at the splenic flexure with extrinsic compression (unable to tolerate prep for full colonoscopy).  Status post diverting colostomy 01/20/14. Doing well postoperatively. Diet currently clear liquid.  Active Problems:  Postoperative urinary retention  Foley had to be replaced last evening. We'll keep in another 24 hours and then increase mobilization and attempt a voiding trial.  Acute kidney injury  Creatinine bump noted 01/19/14. Possibly secondary to contrast nephropathy. BP medications including lisinopril/Maxide are currently being held and the patient is being gently hydrated.  Creatinine stable over the past 24 hours, but  not quite back to baseline, continue to hold lisinopril and Maxzide. Increase IV fluids to 75 cc/hour.  Essential hypertension   Resume home medications when renal function stable: Maxzide and Lisinopril. Continue hydralazine 5 mg IV every 6 hours PRN.   Diabetes mellitus   Currently on insulin sensitive SSI.  CBG range 101-146.  DVT Prophylaxis   Continue Lovenox.    Code Status: Full. Family Communication: Husband updated at the bedside. Disposition Plan: Home when stable.   IV Access:    Peripheral IV  Port-A-Cath placed 01/20/14   Procedures and diagnostic studies:   Ct Abdomen Pelvis W Contrast 01/14/2014 Left upper quadrant soft tissue mass causing obstruction of the colon at the level of the splenic flexure. The mass measures approximately 5.7 cm in greatest dimensions. Exact origin of the tumor is not entirely clear by CT. 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 Second most likely origin would be the colon. No associated colonic perforation is identified by CT. No associated enlarged lymph nodes or distant metastatic lesions are identified in the abdomen or pelvis.   Dg Abd Acute W/chest 01/14/2014 Negative abdominal radiographs. No acute cardiopulmonary disease.   Dg Abd 2 Views 01/15/2014 : No evidence of small bowel obstruction or free air.   Dg Abd 01/16/2014: No evidence of bowel obstruction.  Flexible sigmoidoscopy 01/18/14: The colonoscope was advanced to approximately the region of the splenic flexure. There was stenosis of the colon with hyperemic mucosa suggesting extrinsic compression. The scope would not pass  beyond. No obvious primary mucosal lesion. Left-sided diverticulosis present.  Laparoscopic colostomy and insertion of Port-A-Cath right internal jugular done by Dr. Excell Seltzer 6/96/29: No complications  noted.  Medical Consultants:    Dr. Heath Lark, Oncology.  Dr. Scarlette Shorts, GI.  Dr. Excell Seltzer,  Surgery.   Other Consultants:    None.   Anti-Infectives:    None.  Subjective:   Karen Arias is doing well with no specific complaints postoperatively. She has minimal abdominal pain. No nausea or vomiting. No flatus or bowel movements. Has not had any oral intake yet. Some leakage around the ostomy site.  Objective:    Filed Vitals:   01/20/14 1656 01/20/14 1943 01/21/14 0532 01/21/14 0540  BP: 147/58 114/57 137/61   Pulse: 96 90 47   Temp: 98.6 F (37 C) 98.6 F (37 C) 98.5 F (36.9 C)   TempSrc:  Oral Oral   Resp: 20 18 16    Height:      Weight:    56.246 kg (124 lb)  SpO2: 99% 100% 100%     Intake/Output Summary (Last 24 hours) at 01/21/14 0719 Last data filed at 01/21/14 0532  Gross per 24 hour  Intake   2740 ml  Output    950 ml  Net   1790 ml    Exam: Gen:  NAD Cardiovascular:  RRR, 2/6 systolic ejection murmur Respiratory:  Lungs CTAB Gastrointestinal:  Abdomen softly distended, + BS Extremities:  1+ edema   Data Reviewed:    Labs: Basic Metabolic Panel:  Recent Labs Lab 01/14/14 1402 01/15/14 0518 01/19/14 0353 01/20/14 0350 01/21/14 0400  Karen 134* 131* 131* 135* 133*  K 4.1 3.6* 4.0 3.8 4.4  CL 95* 93* 94* 100 99  CO2 25 21 22 19 20   GLUCOSE 114* 148* 120* 108* 186*  BUN 24* 19 34* 35* 29*  CREATININE 1.00 0.85 1.34* 1.13* 1.12*  CALCIUM 9.9 9.3 9.8 9.1 8.3*   GFR Estimated Creatinine Clearance: 30.1 ml/min (by C-G formula based on Cr of 1.12). Liver Function Tests:  Recent Labs Lab 01/14/14 1402  AST 23  ALT 13  ALKPHOS 56  BILITOT 0.4  PROT 8.1  ALBUMIN 3.9    Recent Labs Lab 01/14/14 1402  LIPASE 32   CBC:  Recent Labs Lab 01/14/14 1402 01/15/14 0518 01/19/14 0353 01/21/14 0400  WBC 7.0 6.4 3.9* 4.0  NEUTROABS 4.5  --   --   --   HGB 12.9 12.0 12.2 10.7*  HCT 38.7 37.2 36.5 32.5*  MCV 91.5 90.7 88.8 89.8  PLT 233 225 239 223   CBG:  Recent Labs Lab 01/20/14 0622 01/20/14 0732  01/20/14 1154 01/20/14 1624 01/20/14 2117  GLUCAP 101* 107* 130* 126* 146*   Microbiology Recent Results (from the past 240 hour(s))  SURGICAL PCR SCREEN     Status: None   Collection Time    01/19/14  8:10 PM      Result Value Ref Range Status   MRSA, PCR NEGATIVE  NEGATIVE Final   Staphylococcus aureus NEGATIVE  NEGATIVE Final   Comment:            The Xpert SA Assay (FDA     approved for NASAL specimens     in patients over 71 years of age),     is one component of     a comprehensive surveillance     program.  Test performance has     been validated by [employer] for patients greater     than or equal to 69 year old.     It is not intended  to diagnose infection nor to     guide or monitor treatment.     Medications:   . enoxaparin (LOVENOX) injection  30 mg Subcutaneous Q24H  . feeding supplement (RESOURCE BREEZE)  1 Container Oral TID BM  . insulin aspart  0-9 Units Subcutaneous TID WC  . pantoprazole  40 mg Oral Daily   Continuous Infusions: . sodium chloride 50 mL/hr at 01/21/14 0547    Time spent: 25 minutes.    LOS: 7 days   [facility] Pager [phone]. If unable to reach me by pager, please call my cell phone at [phone].  *Please refer to [email or web address], password [email or web address] to get updated schedule on who will round on this patient, as hospitalists switch teams weekly. If 7PM-7AM, please contact night-coverage at [email or web address], password [email or web address] for any overnight needs.  01/21/2014, 7:19 AM

## 2014-01-21 NOTE — Progress Notes (Signed)
Patient interviewed and examined, agree with PA note above. She now has an ostomy device that is clean and dry. Feels her preoperative distention and discomfort is improved. Ostomy is edematous and somewhat hemorrhagic but should have no issues with blood supply. Continue clear liquids until bowel function returns.  Edward Jolly MD, FACS  01/21/2014 7:17 PM

## 2014-01-21 NOTE — Progress Notes (Signed)
[facility] Progress Note  1 Day Post-Op  Subjective: Pt having some pain, ostomy leaking a lot, stoma edematous with bloody drainage.  No N/V, anorexia though.  Not ambulated yet.  Daughter at bedside.  Has had sips of clears and is tolerating this.    Objective: Vital signs in last 24 hours: Temp:  [98 F (36.7 C)-98.7 F (37.1 C)] 98.5 F (36.9 C) (09/11 0532) Pulse Rate:  [47-102] 47 (09/11 0532) Resp:  [16-24] 16 (09/11 0532) BP: (114-170)/(57-100) 137/61 mmHg (09/11 0532) SpO2:  [98 %-100 %] 100 % (09/11 0532) Weight:  [124 lb (56.246 kg)] 124 lb (56.246 kg) (09/11 0540) Last BM Date: 01/17/14  Intake/Output from previous day: 09/10 0701 - 09/11 0700 In: 2740 [P.O.:240; I.V.:2500] Out: 950 [Urine:850; Blood:100] Intake/Output this shift:    PE: Gen:  Alert, NAD, pleasant Card:  RRR, no M/G/R heard Pulm:  CTA, no W/R/R Abd: Soft, tender over incision sites and ostomy site, diminished BS, no HSM, incisions C/D/I, ostomy very edematous and bloody drainage, no BM or flatus in bag yet.  Bridge in place making the bag leak. Ext:  No erythema, edema, or tenderness  Lab Results:   Recent Labs  01/19/14 0353 01/21/14 0400  WBC 3.9* 4.0  HGB 12.2 10.7*  HCT 36.5 32.5*  PLT 239 223   BMET  Recent Labs  01/20/14 0350 01/21/14 0400  NA 135* 133*  K 3.8 4.4  CL 100 99  CO2 19 20  GLUCOSE 108* 186*  BUN 35* 29*  CREATININE 1.13* 1.12*  CALCIUM 9.1 8.3*   PT/INR No results found for this basename: LABPROT, INR,  in the last 72 hours CMP     Component Value Date/Time   NA 133* 01/21/2014 0400   K 4.4 01/21/2014 0400   CL 99 01/21/2014 0400   CO2 20 01/21/2014 0400   GLUCOSE 186* 01/21/2014 0400   BUN 29* 01/21/2014 0400   CREATININE 1.12* 01/21/2014 0400   CALCIUM 8.3* 01/21/2014 0400   PROT 8.1 01/14/2014 1402   ALBUMIN 3.9 01/14/2014 1402   AST 23 01/14/2014 1402   ALT 13 01/14/2014 1402   ALKPHOS 56 01/14/2014 1402   BILITOT 0.4 01/14/2014 1402   GFRNONAA 44* 01/21/2014 0400   GFRAA 51* 01/21/2014 0400   Lipase     Component Value Date/Time   LIPASE 32 01/14/2014 1402       Studies/Results: Dg Chest Port 1 View  01/20/2014   CLINICAL DATA:  Port-A-Cath placement.  EXAM: PORTABLE CHEST - 1 VIEW  COMPARISON:  01/14/2014  FINDINGS: Power port has been inserted. The tip is in the superior vena cava at the level of the carina. No pneumothorax. Lungs are clear except for minimal scarring in the left midzone. Slight cardiomegaly. Vascularity is normal.  IMPRESSION: Power port in good position.   Electronically Signed   By: Rozetta Nunnery M.D.   On: 01/20/2014 15:56   Dg C-arm 1-60 Min-no Report  01/20/2014   CLINICAL DATA: surgery   C-ARM 1-60 MINUTES  Fluoroscopy was utilized by the requesting physician.  No radiographic  interpretation.     Anti-infectives: Anti-infectives   Start     Dose/Rate Route Frequency Ordered Stop   01/20/14 0600  [MAR Hold]  cefoTEtan (CEFOTAN) 2 g in dextrose 5 % 50 mL IVPB     (On MAR Hold since 01/20/14 1244)  Comments:  Pharmacy may adjust dose strength for optimal dosing.   Send with patient on call to  the OR.  Anesthesia to complete antibiotic administration <59min prior to incision per [facility].   2 g 100 mL/hr over 30 Minutes Intravenous On call to O.R. 01/19/14 1548 01/20/14 1325       Assessment/Plan Cancer of the pancreas POD #1 s/p INSERTION PORT-A-CATH RIGHT INTERNAL JUGULAR Obstruction of left colon POD #1 s/p LAPAROSCOPIC COLOSTOMY   Plan: 1.  Continue foley due to low output 2.  Ambulate and IS 3.  SCD's and lovenox 4.  On clears, advance diet as bowel function returns 5.  May need PT 6.  She's having leaking around the ostomy site, WOC RN to see today and hopefully replace ostomy bag.  The bar makes it hard to get a bag to fit properly.  Reinforce as needed.  Ostomy is also edematous with sanguinous drainage.  This will resolve with some time.  In the meantime she will require a  wider ostomy bag.     LOS: 7 days    DORT, Jinny Blossom 01/21/2014, 7:58 AM Pager: [phone]

## 2014-01-21 NOTE — Progress Notes (Signed)
Karen Arias made aware that [place] nurse came to see patient and that she removed the sutures on the bridges

## 2014-01-21 NOTE — Progress Notes (Signed)
NUTRITION FOLLOW UP  Intervention:   -Public relations account executive -Encouraged PO intake as tolerated -Diet advancement per MD -Will follow up with colostomy nutrition education needs  Nutrition Dx:   Inadequate oral intake related to altered gi fucntion as evidenced by clear liquid diet.; ongoing  Goal:   Tolerate diet advancement with intake to meet >90% estimated needs; progressing   Monitor:   Diet order, total protein/energy intake, labs, weights, GI profile, diet education needs  Assessment:   9/06: Patient tolerating clear liquid diet well  Ate well until 1 week ago.  UBW 130 lbs with weight loss in the last 4 months  9/11: -Pt s/p flex sigmoidoscopy on 9/08, and s/p colostomy on 9/10 -Tolerating clear liquid diet -Dislikes [employer] as it too sweet, will d/c. Encouraged intake as tolerated -Diet advancement per MD; noted they will advance once bowel function returns. Last BM documented on 9/10; however pt without flatus -No questions yet regarding colostomy; however requested nutrition follow up at later date to review education   Height: Ht Readings from Last 1 Encounters:  01/14/14 5\' 2"  (1.575 m)    Weight Status:   Wt Readings from Last 1 Encounters:  01/21/14 124 lb (56.246 kg)    Re-estimated needs:  Kcal: 1600-1700  Protein: 70-80 gm  Fluid: >1.6L   Skin: surgical incision on abd  Diet Order: Clear Liquid   Intake/Output Summary (Last 24 hours) at 01/21/14 1447 Last data filed at 01/21/14 0700  Gross per 24 hour  Intake 3481.67 ml  Output    575 ml  Net 2906.67 ml    Last BM: 9/10   Labs:   Recent Labs Lab 01/19/14 0353 01/20/14 0350 01/21/14 0400  NA 131* 135* 133*  K 4.0 3.8 4.4  CL 94* 100 99  CO2 22 19 20   BUN 34* 35* 29*  CREATININE 1.34* 1.13* 1.12*  CALCIUM 9.8 9.1 8.3*  GLUCOSE 120* 108* 186*    CBG (last 3)   Recent Labs  01/20/14 2117 01/21/14 0730 01/21/14 1214  GLUCAP 146* 153* 97    Scheduled  Meds: . enoxaparin (LOVENOX) injection  30 mg Subcutaneous Q24H  . feeding supplement (RESOURCE BREEZE)  1 Container Oral TID BM  . insulin aspart  0-9 Units Subcutaneous TID WC  . pantoprazole  40 mg Oral Daily    Continuous Infusions: . sodium chloride 50 mL/hr at 01/21/14 [place] LDN Clinical Dietitian [phone]

## 2014-01-21 NOTE — Progress Notes (Signed)
Yesterday patient arrived from PACU with new colostomy bag slightly leaking from the left side.  It was reinforced prior to patient being sent up to floor. Throughout the night the colostomy dressing continued to leak despite numerous attempts to redress and reinforce the area that was leaking.  The seal was poor between the patient's skin and the adhesive dressing.  Charge RN from ICU was called to assess site and night MD Lucia Gaskins notified regarding the leaking.  Since it looked like the dressing might require suturing, the ostomy bag was reinforced over night till [place], RN/surgeon able to assess in am.  Also, Patient's foley was discontinued during the day at 6 pm.  Patient had still not voided around 1 am.  Pt. Was bladder scanned, which showed 150 cc's urine.  Hospitalist notified and patient was bolused 553ml.  Again, this am patient still had not voided around 6 am.  Bladder scan showed 333 ml.  Order for Foley catheter was received.  After placing foley, 300 ml urine was immediately returned.  Despite these issues, pain was much better over night.  Patient was only medicated twice with dilaudid.  Will continue to monitor. Azzie Glatter [place]

## 2014-01-21 NOTE — Consult Note (Addendum)
WOC ostomy consult note Stoma type/location: LUQ 3 and 1/2 inch round stoma, budded, held in place with Hollister rod.  Originally sutured in a 4 points, I had to cut sutures to accommodate pouching system today.  Patient has been leaking since stoma creation yesterday. Stomal assessment/size: See above Peristomal assessment: intact, clear Treatment options for stomal/peristomal skin: Skin barrier rings (2) placed around stoma Output serosanguinous effluent Ostomy pouching: 2pc.  Education provided: No education provided today. [place] Nursing team will follow along with you next week for teaching. Thanks, Maudie Flakes, MSN, RN, [place], [place], [place] [phone])

## 2014-01-22 LAB — GLUCOSE, CAPILLARY
GLUCOSE-CAPILLARY: 114 mg/dL — AB (ref 70–99)
Glucose-Capillary: 114 mg/dL — ABNORMAL HIGH (ref 70–99)
Glucose-Capillary: 115 mg/dL — ABNORMAL HIGH (ref 70–99)
Glucose-Capillary: 115 mg/dL — ABNORMAL HIGH (ref 70–99)

## 2014-01-22 NOTE — Evaluation (Signed)
Physical Therapy Evaluation Patient Details Name: Karen Arias MRN: [id number] DOB: Feb 24, 1929 Today's Date: 01/22/2014   History of Present Illness  80 yo female s/p lap colostomy, pancreatic Cancer.   Clinical Impression  On eval, pt required Min assist for mobility-able to ambulate ~15 feet with rolling walker. Limited by pain, fatigue. Recommend daily mobility with nursing assistance in addition to therapy.     Follow Up Recommendations Home health PT;Supervision/Assistance - 24 hour    Equipment Recommendations  Rolling walker with 5" wheels    Recommendations for Other Services OT consult     Precautions / Restrictions Precautions Precautions: Fall Precaution Comments: adominal surgery Restrictions Weight Bearing Restrictions: No      Mobility  Bed Mobility Overal bed mobility: Needs Assistance Bed Mobility: Supine to Sit     Supine to sit: Mod assist     General bed mobility comments: assist for trunk to upright and to scoot to eob. Increased time. utilized bedpad for scooting, positioning  Transfers Overall transfer level: Needs assistance Equipment used: Rolling walker (2 wheeled) Transfers: Sit to/from Stand Sit to Stand: Min assist         General transfer comment: assist to rise, stabilize, control descent.   Ambulation/Gait Ambulation/Gait assistance: Min assist Ambulation Distance (Feet): 15 Feet Assistive device: Rolling walker (2 wheeled) Gait Pattern/deviations: Step-through pattern;Decreased stride length     General Gait Details: slow gait speed. pt shaky and unsteady. fatigues easily.   Stairs            Wheelchair Mobility    Modified Rankin (Stroke Patients Only)       Balance Overall balance assessment: Needs assistance         Standing balance support: Bilateral upper extremity supported;During functional activity Standing balance-Leahy Scale: Poor                               Pertinent  Vitals/Pain Pain Assessment: 0-10 Pain Score: 10-Worst pain ever Pain Location: abdomen    Home Living Family/patient expects to be discharged to:: Private residence Living Arrangements: Spouse/significant other;Children   Type of Home: Apartment Home Access: Level entry     Home Layout: One level Home Equipment: Clinical cytogeneticist - 2 wheels      Prior Function Level of Independence: Independent               Hand Dominance        Extremity/Trunk Assessment   Upper Extremity Assessment: Generalized weakness           Lower Extremity Assessment: Generalized weakness      Cervical / Trunk Assessment: Kyphotic  Communication   Communication: No difficulties  Cognition Arousal/Alertness: Awake/alert Behavior During Therapy: WFL for tasks assessed/performed Overall Cognitive Status: Within Functional Limits for tasks assessed                      General Comments      Exercises        Assessment/Plan    PT Assessment Patient needs continued PT services  PT Diagnosis Difficulty walking;Generalized weakness;Acute pain   PT Problem List Decreased strength;Decreased activity tolerance;Decreased balance;Decreased mobility;Decreased knowledge of use of DME;Pain  PT Treatment Interventions DME instruction;Gait training;Functional mobility training;Therapeutic activities;Patient/family education;Balance training;Therapeutic exercise   PT Goals (Current goals can be found in the Care Plan section) Acute Rehab PT Goals Patient Stated Goal: less pain. return to PLOF PT Goal Formulation: With  patient/family Time For Goal Achievement: 02/05/14 Potential to Achieve Goals: Good    Frequency Min 3X/week   Barriers to discharge        Co-evaluation               End of Session   Activity Tolerance: Patient limited by fatigue;Patient limited by pain Patient left: in chair;with call bell/phone within reach;with family/visitor present            Time: 2542-7062 PT Time Calculation (min): 23 min   Charges:   PT Evaluation $Initial PT Evaluation Tier I: 1 Procedure PT Treatments $Gait Training: 8-22 mins $Therapeutic Activity: 8-22 mins   PT G Codes:          Weston Anna, MPT Pager: [phone] \

## 2014-01-22 NOTE — Progress Notes (Signed)
For patient's voiding trial I removed foley catheter at 1916 and patient was able to void within 6 hour mark.

## 2014-01-22 NOTE — Consult Note (Addendum)
WOC ostomy follow up Stoma type/location: Colostomy pouch intact with good seal; applied yesterday by [place] team. Supplies at bedside for staff use if leakage occurs and educational materials left in room.  Small amt reddish-brown liquid stool in pouch. [place] team will follow on Mon. Julien Girt MSN, RN, [place], [place], [place]

## 2014-01-22 NOTE — Progress Notes (Signed)
[facility] Progress Note  2 Days Post-Op loop colostomy and port placement Subjective: Pt feels sore,  Having some anorexia and slight nausea.  Has had sips of clears and is tolerating this.    Objective: Vital signs in last 24 hours: Temp:  [98.2 F (36.8 C)-99.5 F (37.5 C)] 99.5 F (37.5 C) (09/12 0602) Pulse Rate:  [81-101] 101 (09/12 0602) Resp:  [16-18] 16 (09/12 0602) BP: (115-144)/(67-85) 135/78 mmHg (09/12 0602) SpO2:  [100 %] 100 % (09/12 0602) Last BM Date: 01/20/14  Intake/Output from previous day: 09/11 0701 - 09/12 0700 In: 1826.3 [P.O.:240; I.V.:1586.3] Out: 1375 [Urine:1175; Stool:200] Intake/Output this shift:    PE: Gen:  Alert, NAD, pleasant Card:  RRR Pulm:  CTA Abd: Soft, tender over incision sites and ostomy site, incisions C/D/I, ostomy very edematous and bloody drainage, no BM or flatus in bag yet.  Ext:  No erythema, edema, or tenderness  Lab Results:   Recent Labs  01/21/14 0400  WBC 4.0  HGB 10.7*  HCT 32.5*  PLT 223   BMET  Recent Labs  01/20/14 0350 01/21/14 0400  NA 135* 133*  K 3.8 4.4  CL 100 99  CO2 19 20  GLUCOSE 108* 186*  BUN 35* 29*  CREATININE 1.13* 1.12*  CALCIUM 9.1 8.3*   PT/INR No results found for this basename: LABPROT, INR,  in the last 72 hours CMP     Component Value Date/Time   NA 133* 01/21/2014 0400   K 4.4 01/21/2014 0400   CL 99 01/21/2014 0400   CO2 20 01/21/2014 0400   GLUCOSE 186* 01/21/2014 0400   BUN 29* 01/21/2014 0400   CREATININE 1.12* 01/21/2014 0400   CALCIUM 8.3* 01/21/2014 0400   PROT 8.1 01/14/2014 1402   ALBUMIN 3.9 01/14/2014 1402   AST 23 01/14/2014 1402   ALT 13 01/14/2014 1402   ALKPHOS 56 01/14/2014 1402   BILITOT 0.4 01/14/2014 1402   GFRNONAA 44* 01/21/2014 0400   GFRAA 51* 01/21/2014 0400   Lipase     Component Value Date/Time   LIPASE 32 01/14/2014 1402       Studies/Results: Dg Chest Port 1 View  01/20/2014   CLINICAL DATA:  Port-A-Cath placement.  EXAM:  PORTABLE CHEST - 1 VIEW  COMPARISON:  01/14/2014  FINDINGS: Power port has been inserted. The tip is in the superior vena cava at the level of the carina. No pneumothorax. Lungs are clear except for minimal scarring in the left midzone. Slight cardiomegaly. Vascularity is normal.  IMPRESSION: Power port in good position.   Electronically Signed   By: Rozetta Nunnery M.D.   On: 01/20/2014 15:56   Dg C-arm 1-60 Min-no Report  01/20/2014   CLINICAL DATA: surgery   C-ARM 1-60 MINUTES  Fluoroscopy was utilized by the requesting physician.  No radiographic  interpretation.     Anti-infectives: Anti-infectives   Start     Dose/Rate Route Frequency Ordered Stop   01/20/14 0600  [MAR Hold]  cefoTEtan (CEFOTAN) 2 g in dextrose 5 % 50 mL IVPB     (On MAR Hold since 01/20/14 1244)  Comments:  Pharmacy may adjust dose strength for optimal dosing.   Send with patient on call to the OR.  Anesthesia to complete antibiotic administration <33min prior to incision per [facility].   2 g 100 mL/hr over 30 Minutes Intravenous On call to O.R. 01/19/14 1548 01/20/14 1325       Assessment/Plan Cancer of the pancreas POD #2  s/p INSERTION PORT-A-CATH RIGHT INTERNAL JUGULAR Obstruction of left colon POD #2 s/p LAPAROSCOPIC COLOSTOMY   Plan: 1.  Continue foley due to urinary retention, UOP good.  Will plan on d/c in AM 2.  Ambulate and IS 3.  SCD's and lovenox 4.  Cont clears for now, given nausea 5.  May need PT 6.  Ostomy is edematous with sanguinous drainage.  This will resolve with some time.  In the meantime she will require a wider ostomy bag.     LOS: 8 days    Hakiem Malizia C.

## 2014-01-22 NOTE — Progress Notes (Signed)
Progress Note   Karen Arias VXB:[id number] DOB: [birth year] DOA: 01/14/2014 PCP: [name], MD   Brief Narrative:   Karen Arias is an 80 y.o. female with a PMH of hypertension, hiatal hernia (EGD in 09/2012) who followed with Dr. [name] for GI related issues who was admitted 01/14/14 with chief complaint of abdominal pain and constipation. She was found to have LUQ soft tissue mass (5.7 cm in diameter) on CT abdomen causing obstruction of the colon at the level of the splenic flexure, worrisome for malignancy. Her tumor marker CA 19-9 elevated, concerning for pancreatic cancer. Colonoscopy prep attempted but patient could not tolerate well. Underwent a flexible sigmoidoscopy 01/18/14 which showed stenosis of the colon at the splenic flexure with extrinsic compression.  Underwent diverting colostomy 01/20/14.   Assessment/Plan:   Principal Problem:  Left upper quadrant mass causing bowel obstruction, probable pancreatic cancer status post diverting colostomy   Left upper quadrant mass worrisome for pancreatic cancer. CEA WNL. CA 19-9 3434. CA 125 is 64.   Pt was seen by oncology, Dr. [name] who spoke to the patient and her family about possible palliative chemotherapy.   Status post flexible sigmoidoscopy 01/18/14 showing stenosis of the colon at the splenic flexure with extrinsic compression (unable to tolerate prep for full colonoscopy).  Status post diverting colostomy 01/20/14. Doing well postoperatively. Diet currently clear liquid.  Active Problems:  Postoperative urinary retention  Foley had to be replaced last evening. We'll keep in another 24 hours and then increase mobilization and attempt a voiding trial.  Voiding trial today.  Acute kidney injury  Creatinine bump noted 01/19/14. Possibly secondary to contrast nephropathy. BP medications including lisinopril/Maxide are currently being held and the patient is being gently hydrated.  Creatinine stable  over the past 24 hours, but not quite back to baseline, continue to hold lisinopril and Maxzide. Increase IV fluids to 75 cc/hour.  Essential hypertension   Resume home medications when renal function stable: Maxzide and Lisinopril. Continue hydralazine 5 mg IV every 6 hours PRN.   Diabetes mellitus   Currently on insulin sensitive SSI.  CBG range 97-207.  DVT Prophylaxis   Continue Lovenox.    Code Status: Full. Family Communication: Daughter updated at the bedside. Disposition Plan: Home when stable.   IV Access:    Peripheral IV  Port-A-Cath placed 01/20/14   Procedures and diagnostic studies:   Ct Abdomen Pelvis W Contrast 01/14/2014 Left upper quadrant soft tissue mass causing obstruction of the colon at the level of the splenic flexure. The mass measures approximately 5.7 cm in greatest dimensions. Exact origin of the tumor is not entirely clear by CT. 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 Second most likely origin would be the colon. No associated colonic perforation is identified by CT. No associated enlarged lymph nodes or distant metastatic lesions are identified in the abdomen or pelvis.   Dg Abd Acute W/chest 01/14/2014 Negative abdominal radiographs. No acute cardiopulmonary disease.   Dg Abd 2 Views 01/15/2014 : No evidence of small bowel obstruction or free air.   Dg Abd 01/16/2014: No evidence of bowel obstruction.  Flexible sigmoidoscopy 01/18/14: The colonoscope was advanced to approximately the region of the splenic flexure. There was stenosis of the colon with hyperemic mucosa suggesting extrinsic compression. The scope would not pass  beyond. No obvious primary mucosal lesion. Left-sided diverticulosis present.  Laparoscopic colostomy and insertion of Port-A-Cath right internal jugular done by Dr.  Hoxworth 08/18/66: No complications noted.  Medical Consultants:    Dr. Heath Lark, Oncology.  Dr. Scarlette Shorts,  GI.  Dr. Excell Seltzer, Surgery.   Other Consultants:    None.   Anti-Infectives:    None.  Subjective:   Karen Arias has had some postoperative nausea and soreness, but no vomiting or significant pain. Taking sips of clear liquids. Sitting up in a chair today.  Objective:    Filed Vitals:   01/21/14 0540 01/21/14 1553 01/21/14 2050 01/22/14 0602  BP:  115/67 144/85 135/78  Pulse:  81 99 101  Temp:  98.2 F (36.8 C) 98.2 F (36.8 C) 99.5 F (37.5 C)  TempSrc:  Oral Oral Oral  Resp:  16 18 16   Height:      Weight: 56.246 kg (124 lb)     SpO2:  100% 100% 100%    Intake/Output Summary (Last 24 hours) at 01/22/14 0814 Last data filed at 01/22/14 0610  Gross per 24 hour  Intake 1826.25 ml  Output   1375 ml  Net 451.25 ml    Exam: Gen:  NAD Cardiovascular:  RRR, 2/6 systolic ejection murmur Respiratory:  Lungs CTAB Gastrointestinal:  Abdomen softly distended, + BS, ostomy site with swollen stoma, bloody drainage in the bag Extremities:  1+ edema   Data Reviewed:    Labs: Basic Metabolic Panel:  Recent Labs Lab 01/19/14 0353 01/20/14 0350 01/21/14 0400  NA 131* 135* 133*  K 4.0 3.8 4.4  CL 94* 100 99  CO2 22 19 20   GLUCOSE 120* 108* 186*  BUN 34* 35* 29*  CREATININE 1.34* 1.13* 1.12*  CALCIUM 9.8 9.1 8.3*   GFR Estimated Creatinine Clearance: 30.1 ml/min (by C-G formula based on Cr of 1.12).  CBC:  Recent Labs Lab 01/19/14 0353 01/21/14 0400  WBC 3.9* 4.0  HGB 12.2 10.7*  HCT 36.5 32.5*  MCV 88.8 89.8  PLT 239 223   CBG:  Recent Labs Lab 01/21/14 1214 01/21/14 1658 01/21/14 1833 01/21/14 2052 01/22/14 0744  GLUCAP 97 207* 120* 112* 114*   Microbiology Recent Results (from the past 240 hour(s))  SURGICAL PCR SCREEN     Status: None   Collection Time    01/19/14  8:10 PM      Result Value Ref Range Status   MRSA, PCR NEGATIVE  NEGATIVE Final   Staphylococcus aureus NEGATIVE  NEGATIVE Final   Comment:             The Xpert SA Assay (FDA     approved for NASAL specimens     in patients over 42 years of age),     is one component of     a comprehensive surveillance     program.  Test performance has     been validated by [employer] for patients greater     than or equal to 36 year old.     It is not intended     to diagnose infection nor to     guide or monitor treatment.     Medications:   . enoxaparin (LOVENOX) injection  30 mg Subcutaneous Q24H  . insulin aspart  0-9 Units Subcutaneous TID WC  . pantoprazole  40 mg Oral Daily   Continuous Infusions: . sodium chloride 75 mL/hr at 01/22/14 0542    Time spent: 25 minutes.    LOS: 8 days   [facility] Pager [phone]. If unable to reach me by  pager, please call my cell phone at [phone].  *Please refer to [email or web address], password [email or web address] to get updated schedule on who will round on this patient, as hospitalists switch teams weekly. If 7PM-7AM, please contact night-coverage at [email or web address], password [email or web address] for any overnight needs.  01/22/2014, 8:14 AM

## 2014-01-23 LAB — BASIC METABOLIC PANEL
ANION GAP: 15 (ref 5–15)
BUN: 13 mg/dL (ref 6–23)
CALCIUM: 8.3 mg/dL — AB (ref 8.4–10.5)
CO2: 23 meq/L (ref 19–32)
Chloride: 99 mEq/L (ref 96–112)
Creatinine, Ser: 0.83 mg/dL (ref 0.50–1.10)
GFR, EST AFRICAN AMERICAN: 74 mL/min — AB (ref >90)
GFR, EST NON AFRICAN AMERICAN: 63 mL/min — AB (ref >90)
Glucose, Bld: 101 mg/dL — ABNORMAL HIGH (ref 70–99)
Potassium: 3.4 mEq/L — ABNORMAL LOW (ref 3.7–5.3)
SODIUM: 137 meq/L (ref 137–147)

## 2014-01-23 LAB — GLUCOSE, CAPILLARY
Glucose-Capillary: 101 mg/dL — ABNORMAL HIGH (ref 70–99)
Glucose-Capillary: 107 mg/dL — ABNORMAL HIGH (ref 70–99)
Glucose-Capillary: 124 mg/dL — ABNORMAL HIGH (ref 70–99)
Glucose-Capillary: 98 mg/dL (ref 70–99)

## 2014-01-23 LAB — CBC
HCT: 30.6 % — ABNORMAL LOW (ref 36.0–46.0)
Hemoglobin: 10.4 g/dL — ABNORMAL LOW (ref 12.0–15.0)
MCH: 30.1 pg (ref 26.0–34.0)
MCHC: 34 g/dL (ref 30.0–36.0)
MCV: 88.7 fL (ref 78.0–100.0)
PLATELETS: 202 10*3/uL (ref 150–400)
RBC: 3.45 MIL/uL — ABNORMAL LOW (ref 3.87–5.11)
RDW: 12.9 % (ref 11.5–15.5)
WBC: 4.5 10*3/uL (ref 4.0–10.5)

## 2014-01-23 MED ORDER — ENOXAPARIN SODIUM 40 MG/0.4ML ~~LOC~~ SOLN
40.0000 mg | SUBCUTANEOUS | Status: DC
  Administered 2014-01-23 – 2014-01-25 (×3): 40 mg via SUBCUTANEOUS
  Filled 2014-01-23 (×4): qty 0.4

## 2014-01-23 MED ORDER — POTASSIUM CHLORIDE IN NACL 20-0.9 MEQ/L-% IV SOLN
INTRAVENOUS | Status: DC
  Administered 2014-01-23 – 2014-01-30 (×9): via INTRAVENOUS
  Filled 2014-01-23 (×13): qty 1000

## 2014-01-23 NOTE — Progress Notes (Signed)
Physical Therapy Treatment Patient Details Name: IDY RAWLING MRN: [id number] DOB: August 29, 1928 Today's Date: 01/23/2014    History of Present Illness 80 yo female s/p lap colostomy, pancreatic Cancer.     PT Comments    Progressing slowly with mobility.   Follow Up Recommendations  Home health PT;Supervision/Assistance - 24 hour     Equipment Recommendations  Rolling walker with 5" wheels (possibly)    Recommendations for Other Services OT consult     Precautions / Restrictions Precautions Precautions: Fall Precaution Comments: adominal surgery; colostomy Restrictions Weight Bearing Restrictions: No    Mobility  Bed Mobility Overal bed mobility: Needs Assistance Bed Mobility: Supine to Sit     Supine to sit: Min guard;HOB elevated     General bed mobility comments: Increased time.   Transfers Overall transfer level: Needs assistance Equipment used: None Transfers: Sit to/from Stand Sit to Stand: Min guard         General transfer comment: close guard for safety  Ambulation/Gait Ambulation/Gait assistance: Min guard Ambulation Distance (Feet): 90 Feet Assistive device: Rolling walker (2 wheeled) Gait Pattern/deviations: Step-through pattern;Decreased stride length     General Gait Details: slow gait speed. fatigues easily. VC safe use of walker.    Stairs            Wheelchair Mobility    Modified Rankin (Stroke Patients Only)       Balance           Standing balance support: Bilateral upper extremity supported;During functional activity Standing balance-Leahy Scale: Poor                      Cognition Arousal/Alertness: Awake/alert Behavior During Therapy: WFL for tasks assessed/performed Overall Cognitive Status: Within Functional Limits for tasks assessed                      Exercises      General Comments        Pertinent Vitals/Pain Pain Assessment: No/denies pain    Home Living                      Prior Function            PT Goals (current goals can now be found in the care plan section) Progress towards PT goals: Progressing toward goals    Frequency  Min 3X/week    PT Plan Current plan remains appropriate    Co-evaluation             End of Session   Activity Tolerance: Patient tolerated treatment well Patient left: in chair;with call bell/phone within reach     Time: 1522-1536 PT Time Calculation (min): 14 min  Charges:  $Gait Training: 8-22 mins                    G Codes:      Weston Anna, MPT Pager: [phone]

## 2014-01-23 NOTE — Progress Notes (Signed)
Progress Note   MADOLIN TWADDLE HWK:[id number] DOB: 25-Jan-1929 DOA: 01/14/2014 PCP: [name], MD   Brief Narrative:   Karen Arias is an 80 y.o. female with a PMH of hypertension, hiatal hernia (EGD in 09/2012) who followed with Dr. [name] for GI related issues who was admitted 01/14/14 with chief complaint of abdominal pain and constipation. She was found to have LUQ soft tissue mass (5.7 cm in diameter) on CT abdomen causing obstruction of the colon at the level of the splenic flexure, worrisome for malignancy. Her tumor marker CA 19-9 elevated, concerning for pancreatic cancer. Colonoscopy prep attempted but patient could not tolerate well. Underwent a flexible sigmoidoscopy 01/18/14 which showed stenosis of the colon at the splenic flexure with extrinsic compression.  Underwent diverting colostomy 01/20/14.   Assessment/Plan:   Principal Problem:  Left upper quadrant mass causing bowel obstruction, probable pancreatic cancer status post diverting colostomy   Left upper quadrant mass worrisome for pancreatic cancer. CEA WNL. CA 19-9 3434. CA 125 is 64.   Status post flexible sigmoidoscopy 01/18/14 showing stenosis of the colon at the splenic flexure with extrinsic compression (unable to tolerate prep for full colonoscopy).  Status post diverting colostomy 01/20/14. Doing well postoperatively. Diet currently clear liquid.  Dr. [name] plans palliative chemotherapy once patient stable.  Active Problems: Hypokalemia  Add potassium to IV fluids.   Postoperative urinary retention  Resolved, Foley successfully discontinued 01/22/14 after a voiding trial.  Acute kidney injury  Creatinine bump noted 01/19/14. Possibly secondary to contrast nephropathy. BP medications including lisinopril/Maxide are currently being held and the patient is being gently hydrated.  Creatinine back to baseline by 01/23/14.  Continue to hold lisinopril and Maxzide. Continue IV fluids at 75  cc/hour.  Essential hypertension   Resume home medications when renal function stable: Maxzide and Lisinopril. Continue hydralazine 5 mg IV every 6 hours PRN.   Diabetes mellitus   Currently on insulin sensitive SSI.  CBG range on 112-115.  DVT Prophylaxis   Continue Lovenox.    Code Status: Full. Family Communication: Daughter updated at the bedside 01/22/14. Disposition Plan: Home when stable.   IV Access:    Peripheral IV  Port-A-Cath placed 01/20/14   Procedures and diagnostic studies:   Ct Abdomen Pelvis W Contrast 01/14/2014 Left upper quadrant soft tissue mass causing obstruction of the colon at the level of the splenic flexure. The mass measures approximately 5.7 cm in greatest dimensions. Exact origin of the tumor is not entirely clear by CT. 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 Second most likely origin would be the colon. No associated colonic perforation is identified by CT. No associated enlarged lymph nodes or distant metastatic lesions are identified in the abdomen or pelvis.   Dg Abd Acute W/chest 01/14/2014 Negative abdominal radiographs. No acute cardiopulmonary disease.   Dg Abd 2 Views 01/15/2014 : No evidence of small bowel obstruction or free air.   Dg Abd 01/16/2014: No evidence of bowel obstruction.  Flexible sigmoidoscopy 01/18/14: The colonoscope was advanced to approximately the region of the splenic flexure. There was stenosis of the colon with hyperemic mucosa suggesting extrinsic compression. The scope would not pass  beyond. No obvious primary mucosal lesion. Left-sided diverticulosis present.  Laparoscopic colostomy and insertion of Port-A-Cath right internal jugular done by Dr. Excell Seltzer 9/45/85: No complications noted.  Medical Consultants:    Dr. Heath Lark, Oncology.  Dr. Scarlette Shorts, GI.  Dr. Excell Seltzer, Surgery.  Other Consultants:    Physical therapy: Home health PT with 24-hour  supervision/assistance.  Walker with 5 inch wheels.   Anti-Infectives:    None.  Subjective:   Karen Arias has had some postoperative nausea and soreness, rates her abdominal pain as 8/10. Taking sips of clear liquids. She passed her voiding trial yesterday, and the Foley is now out.  Objective:    Filed Vitals:   01/22/14 0602 01/22/14 1423 01/22/14 2035 01/23/14 0440  BP: 135/78 136/91 166/78 132/64  Pulse: 101 92 93 98  Temp: 99.5 F (37.5 C) 98.6 F (37 C) 98.5 F (36.9 C) 99 F (37.2 C)  TempSrc: Oral Oral Oral Oral  Resp: 16 16 16 16   Height:      Weight:    59.6 kg (131 lb 6.3 oz)  SpO2: 100% 100% 96% 100%    Intake/Output Summary (Last 24 hours) at 01/23/14 0745 Last data filed at 01/23/14 0600  Gross per 24 hour  Intake 2267.5 ml  Output   1750 ml  Net  517.5 ml    Exam: Gen:  NAD Cardiovascular:  RRR, 2/6 systolic ejection murmur Respiratory:  Lungs CTAB Gastrointestinal:  Abdomen softly distended, + BS, ostomy site with markedly swollen stoma, bloody drainage in the bag Extremities:  1+ edema   Data Reviewed:    Labs: Basic Metabolic Panel:  Recent Labs Lab 01/19/14 0353 01/20/14 0350 01/21/14 0400 01/23/14 0418  NA 131* 135* 133* 137  K 4.0 3.8 4.4 3.4*  CL 94* 100 99 99  CO2 22 19 20 23   GLUCOSE 120* 108* 186* 101*  BUN 34* 35* 29* 13  CREATININE 1.34* 1.13* 1.12* 0.83  CALCIUM 9.8 9.1 8.3* 8.3*   GFR Estimated Creatinine Clearance: 40.6 ml/min (by C-G formula based on Cr of 0.83).  CBC:  Recent Labs Lab 01/19/14 0353 01/21/14 0400 01/23/14 0418  WBC 3.9* 4.0 4.5  HGB 12.2 10.7* 10.4*  HCT 36.5 32.5* 30.6*  MCV 88.8 89.8 88.7  PLT 239 223 202   CBG:  Recent Labs Lab 01/21/14 2052 01/22/14 0744 01/22/14 1215 01/22/14 1720 01/22/14 2155  GLUCAP 112* 114* 115* 115* 114*   Microbiology Recent Results (from the past 240 hour(s))  SURGICAL PCR SCREEN     Status: None   Collection Time    01/19/14  8:10 PM       Result Value Ref Range Status   MRSA, PCR NEGATIVE  NEGATIVE Final   Staphylococcus aureus NEGATIVE  NEGATIVE Final   Comment:            The Xpert SA Assay (FDA     approved for NASAL specimens     in patients over 67 years of age),     is one component of     a comprehensive surveillance     program.  Test performance has     been validated by [employer] for patients greater     than or equal to 62 year old.     It is not intended     to diagnose infection nor to     guide or monitor treatment.     Medications:   . enoxaparin (LOVENOX) injection  30 mg Subcutaneous Q24H  . insulin aspart  0-9 Units Subcutaneous TID WC  . pantoprazole  40 mg Oral Daily   Continuous Infusions: . sodium chloride 75 mL/hr at 01/22/14 2027    Time spent: 25 minutes.    LOS:  9 days   [facility] Pager [phone]. If unable to reach me by pager, please call my cell phone at [phone].  *Please refer to [email or web address], password [email or web address] to get updated schedule on who will round on this patient, as hospitalists switch teams weekly. If 7PM-7AM, please contact night-coverage at [email or web address], password [email or web address] for any overnight needs.  01/23/2014, 7:45 AM

## 2014-01-23 NOTE — Progress Notes (Signed)
Patient ID: Karen Arias, female   DOB: 12-06-1928, 80 y.o.   MRN: [id number] [facility] Surgery Progress Note:   3 Days Post-Op  Subjective: Mental status is clear. No complaints Objective: Vital signs in last 24 hours: Temp:  [98.5 F (36.9 C)-99 F (37.2 C)] 99 F (37.2 C) (09/13 0440) Pulse Rate:  [92-98] 98 (09/13 0440) Resp:  [16] 16 (09/13 0440) BP: (132-166)/(64-91) 132/64 mmHg (09/13 0440) SpO2:  [96 %-100 %] 100 % (09/13 0440) Weight:  [131 lb 6.3 oz (59.6 kg)] 131 lb 6.3 oz (59.6 kg) (09/13 0440)  Intake/Output from previous day: 09/12 0701 - 09/13 0700 In: 2267.5 [P.O.:480; I.V.:1787.5] Out: 1750 [Urine:1550; Stool:200] Intake/Output this shift:    Physical Exam: Work of breathing is not labored.  No abdominal pain  Lab Results:  Results for orders placed during the hospital encounter of 01/14/14 (from the past 48 hour(s))  GLUCOSE, CAPILLARY     Status: None   Collection Time    01/21/14 12:14 PM      Result Value Ref Range   Glucose-Capillary 97  70 - 99 mg/dL   Comment 1 Documented in Chart     Comment 2 Notify RN    GLUCOSE, CAPILLARY     Status: Abnormal   Collection Time    01/21/14  4:58 PM      Result Value Ref Range   Glucose-Capillary 207 (*) 70 - 99 mg/dL   Comment 1 Documented in Chart     Comment 2 Notify RN    GLUCOSE, CAPILLARY     Status: Abnormal   Collection Time    01/21/14  6:33 PM      Result Value Ref Range   Glucose-Capillary 120 (*) 70 - 99 mg/dL   Comment 1 Documented in Chart     Comment 2 Notify RN    GLUCOSE, CAPILLARY     Status: Abnormal   Collection Time    01/21/14  8:52 PM      Result Value Ref Range   Glucose-Capillary 112 (*) 70 - 99 mg/dL   Comment 1 Notify RN    GLUCOSE, CAPILLARY     Status: Abnormal   Collection Time    01/22/14  7:44 AM      Result Value Ref Range   Glucose-Capillary 114 (*) 70 - 99 mg/dL  GLUCOSE, CAPILLARY     Status: Abnormal   Collection Time    01/22/14 12:15 PM      Result  Value Ref Range   Glucose-Capillary 115 (*) 70 - 99 mg/dL  GLUCOSE, CAPILLARY     Status: Abnormal   Collection Time    01/22/14  5:20 PM      Result Value Ref Range   Glucose-Capillary 115 (*) 70 - 99 mg/dL  GLUCOSE, CAPILLARY     Status: Abnormal   Collection Time    01/22/14  9:55 PM      Result Value Ref Range   Glucose-Capillary 114 (*) 70 - 99 mg/dL   Comment 1 Notify RN    BASIC METABOLIC PANEL     Status: Abnormal   Collection Time    01/23/14  4:18 AM      Result Value Ref Range   Sodium 137  137 - 147 mEq/L   Potassium 3.4 (*) 3.7 - 5.3 mEq/L   Chloride 99  96 - 112 mEq/L   CO2 23  19 - 32 mEq/L   Glucose, Bld 101 (*) 70 - 99  mg/dL   BUN 13  6 - 23 mg/dL   Creatinine, Ser 0.83  0.50 - 1.10 mg/dL   Calcium 8.3 (*) 8.4 - 10.5 mg/dL   GFR calc non Af Amer 63 (*) >90 mL/min   GFR calc Af Amer 74 (*) >90 mL/min   Comment: (NOTE)     The eGFR has been calculated using the CKD EPI equation.     This calculation has not been validated in all clinical situations.     eGFR's persistently <90 mL/min signify possible Chronic Kidney     Disease.   Anion gap 15  5 - 15  CBC     Status: Abnormal   Collection Time    01/23/14  4:18 AM      Result Value Ref Range   WBC 4.5  4.0 - 10.5 K/uL   RBC 3.45 (*) 3.87 - 5.11 MIL/uL   Hemoglobin 10.4 (*) 12.0 - 15.0 g/dL   HCT 30.6 (*) 36.0 - 46.0 %   MCV 88.7  78.0 - 100.0 fL   MCH 30.1  26.0 - 34.0 pg   MCHC 34.0  30.0 - 36.0 g/dL   RDW 12.9  11.5 - 15.5 %   Platelets 202  150 - 400 K/uL    Radiology/Results: No results found.  Anti-infectives: Anti-infectives   Start     Dose/Rate Route Frequency Ordered Stop   01/20/14 0600  [MAR Hold]  cefoTEtan (CEFOTAN) 2 g in dextrose 5 % 50 mL IVPB     (On MAR Hold since 01/20/14 1244)  Comments:  Pharmacy may adjust dose strength for optimal dosing.   Send with patient on call to the OR.  Anesthesia to complete antibiotic administration <46mn [facility]   2  g 100 mL/hr over 30 Minutes Intravenous On call to O.R. 01/19/14 1548 01/20/14 1325      Assessment/Plan: Problem List: Patient Active Problem List   Diagnosis Date Noted  . Urinary retention 01/21/2014  . Acute kidney injury 01/19/2014  . Nonspecific (abnormal) findings on radiological and other examination of gastrointestinal tract 01/18/2014  . Colonic obstruction secondary to pancreatic mass status post diverting colostomy 01/15/2014  . Abdominal pain 01/14/2014  . Essential hypertension, benign 09/16/2012  . Other and unspecified hyperlipidemia 09/16/2012  . Diabetes mellitus type 2 in nonobese 09/16/2012    Stable postop.   3 Days Post-Op    LOS: 9 days   Matt B. MHassell Done MD, FMontefi[facility] Hospital[facility], [facility] [phone] 01/23/2014 8:13 AM

## 2014-01-24 LAB — BASIC METABOLIC PANEL
Anion gap: 16 — ABNORMAL HIGH (ref 5–15)
BUN: 14 mg/dL (ref 6–23)
CHLORIDE: 100 meq/L (ref 96–112)
CO2: 20 meq/L (ref 19–32)
CREATININE: 0.77 mg/dL (ref 0.50–1.10)
Calcium: 8.7 mg/dL (ref 8.4–10.5)
GFR calc Af Amer: 88 mL/min — ABNORMAL LOW (ref >90)
GFR calc non Af Amer: 76 mL/min — ABNORMAL LOW (ref >90)
GLUCOSE: 95 mg/dL (ref 70–99)
Potassium: 3.6 mEq/L — ABNORMAL LOW (ref 3.7–5.3)
Sodium: 136 mEq/L — ABNORMAL LOW (ref 137–147)

## 2014-01-24 LAB — GLUCOSE, CAPILLARY
GLUCOSE-CAPILLARY: 101 mg/dL — AB (ref 70–99)
GLUCOSE-CAPILLARY: 134 mg/dL — AB (ref 70–99)
Glucose-Capillary: 119 mg/dL — ABNORMAL HIGH (ref 70–99)
Glucose-Capillary: 130 mg/dL — ABNORMAL HIGH (ref 70–99)

## 2014-01-24 LAB — CBC
HEMATOCRIT: 34.4 % — AB (ref 36.0–46.0)
HEMOGLOBIN: 11.2 g/dL — AB (ref 12.0–15.0)
MCH: 29.4 pg (ref 26.0–34.0)
MCHC: 32.6 g/dL (ref 30.0–36.0)
MCV: 90.3 fL (ref 78.0–100.0)
Platelets: 215 10*3/uL (ref 150–400)
RBC: 3.81 MIL/uL — ABNORMAL LOW (ref 3.87–5.11)
RDW: 12.9 % (ref 11.5–15.5)
WBC: 5.5 10*3/uL (ref 4.0–10.5)

## 2014-01-24 MED ORDER — POTASSIUM CHLORIDE CRYS ER 20 MEQ PO TBCR
40.0000 meq | EXTENDED_RELEASE_TABLET | Freq: Once | ORAL | Status: AC
  Administered 2014-01-24: 40 meq via ORAL
  Filled 2014-01-24: qty 2

## 2014-01-24 NOTE — Progress Notes (Signed)
Karen Arias   DOB:08-26-1928   UD#:[id number]    Subjective: She complained of pain at the surgical site. She has no nausea. She appears to like motivation to move around.  Objective:  Filed Vitals:   01/24/14 0450  BP: 141/77  Pulse: 83  Temp: 98.9 F (37.2 C)  Resp: 16     Intake/Output Summary (Last 24 hours) at 01/24/14 1347 Last data filed at 01/23/14 2131  Gross per 24 hour  Intake    240 ml  Output    350 ml  Net   -110 ml    GENERAL:alert, no distress and comfortable SKIN: skin color, texture, turgor are normal, no rashes or significant lesions. Surgical scars appeared well-healed. EYES: normal, Conjunctiva are pale and non-injected, sclera clear Musculoskeletal:no cyanosis of digits and no clubbing  NEURO: alert & oriented x 3 with fluent speech, no focal motor/sensory deficits   Labs:  Lab Results  Component Value Date   WBC 5.5 01/24/2014   HGB 11.2* 01/24/2014   HCT 34.4* 01/24/2014   MCV 90.3 01/24/2014   PLT 215 01/24/2014   NEUTROABS 4.5 01/14/2014    Lab Results  Component Value Date   NA 136* 01/24/2014   K 3.6* 01/24/2014   CL 100 01/24/2014   CO2 20 01/24/2014   I have reviewed her operative report. Assessment & Plan:  #1 abdominal pain, nausea and constipation, consistent with bowel obstruction from extrinsic compression of colon status post divergent colostomy #2 pancreatic mass extending to the colon with elevated CA 19-9, likely locally advanced pancreatic cancer until proven otherwise  #3 anorexia with recent weight loss  I discussed with her and her family about the prognosis in general regarding the role of palliative chemotherapy. She wants to get chemotherapy after surgery.  I plan to see her back in the office next week for further discussion. She is currently on medication for pain control and has her diet advance to full liquids. Hopefully she can be discharged soon. I will sign off. I address all questions and concerns.    Gooding,  [place], MD 01/24/2014  1:47 PM

## 2014-01-24 NOTE — Progress Notes (Signed)
I have seen and examined the pt and agree with PA-Osborne's progress note. Ostomy working FLD  ambulate

## 2014-01-24 NOTE — Progress Notes (Signed)
Patient ID: Karen Arias, female   DOB: 12/03/1928, 80 y.o.   MRN: [id number] 4 Days Post-Op  Subjective: Patient having a bit of nausea at times.  Otherwise doing ok with clears.  Abdominal soreness, but no significant pain  Objective: Vital signs in last 24 hours: Temp:  [98.4 F (36.9 C)-98.9 F (37.2 C)] 98.9 F (37.2 C) (09/14 0450) Pulse Rate:  [63-85] 83 (09/14 0450) Resp:  [16] 16 (09/14 0450) BP: (141-148)/(61-77) 141/77 mmHg (09/14 0450) SpO2:  [99 %-100 %] 100 % (09/14 0450) Last BM Date: 01/24/14  Intake/Output from previous day: 09/13 0701 - 09/14 0700 In: 240 [P.O.:240] Out: 550 [Urine:400; Stool:150] Intake/Output this shift:    PE: Abd: soft, some distention, stoma has some necrotic tissue on the outside, but below is good viable tissue.  Stoma is edematous.  Rod in place  Lab Results:   Recent Labs  01/23/14 0418 01/24/14 0418  WBC 4.5 5.5  HGB 10.4* 11.2*  HCT 30.6* 34.4*  PLT 202 215   BMET  Recent Labs  01/23/14 0418 01/24/14 0418  NA 137 136*  K 3.4* 3.6*  CL 99 100  CO2 23 20  GLUCOSE 101* 95  BUN 13 14  CREATININE 0.83 0.77  CALCIUM 8.3* 8.7   PT/INR No results found for this basename: LABPROT, INR,  in the last 72 hours CMP     Component Value Date/Time   NA 136* 01/24/2014 0418   K 3.6* 01/24/2014 0418   CL 100 01/24/2014 0418   CO2 20 01/24/2014 0418   GLUCOSE 95 01/24/2014 0418   BUN 14 01/24/2014 0418   CREATININE 0.77 01/24/2014 0418   CALCIUM 8.7 01/24/2014 0418   PROT 8.1 01/14/2014 1402   ALBUMIN 3.9 01/14/2014 1402   AST 23 01/14/2014 1402   ALT 13 01/14/2014 1402   ALKPHOS 56 01/14/2014 1402   BILITOT 0.4 01/14/2014 1402   GFRNONAA 76* 01/24/2014 0418   GFRAA 88* 01/24/2014 0418   Lipase     Component Value Date/Time   LIPASE 32 01/14/2014 1402       Studies/Results: No results found.  Anti-infectives: Anti-infectives   Start     Dose/Rate Route Frequency Ordered Stop   01/20/14 0600  [MAR Hold]  cefoTEtan  (CEFOTAN) 2 g in dextrose 5 % 50 mL IVPB     (On MAR Hold since 01/20/14 1244)  Comments:  Pharmacy may adjust dose strength for optimal dosing.   Send with patient on call to the OR.  Anesthesia to complete antibiotic administration <54min prior to incision per [facility]   2 g 100 mL/hr over 30 Minutes Intravenous On call to O.R. 01/19/14 1548 01/20/14 1325       Assessment/Plan   Cancer of the pancreas POD #3 s/p INSERTION PORT-A-CATH RIGHT INTERNAL JUGULAR  Obstruction of left colon POD #3 s/p LAPAROSCOPIC COLOSTOMY   Plan: 1. Cont clear liquids today.  Some nausea, but no emesis 2. Mobilize and pulm toilet 3. Sutures had to be cut on bridge for ostomy on Friday night due to persistent leaking due to edematous stoma.  No further issues with this at this time.   LOS: 10 days    Sequoia Mincey E 01/24/2014, 8:44 AM Pager: [phone]

## 2014-01-24 NOTE — Progress Notes (Signed)
Progress Note   Karen Arias:[id number] DOB: 06/19/1928 DOA: 01/14/2014 PCP: [name], MD   Brief Narrative:   Karen Arias is an 80 y.o. female with a PMH of hypertension, hiatal hernia (EGD in 09/2012) who followed with Dr. [name] for GI related issues who was admitted 01/14/14 with chief complaint of abdominal pain and constipation. She was found to have LUQ soft tissue mass (5.7 cm in diameter) on CT abdomen causing obstruction of the colon at the level of the splenic flexure, worrisome for malignancy. Her tumor marker CA 19-9 elevated, concerning for pancreatic cancer. Colonoscopy prep attempted but patient could not tolerate well. Underwent a flexible sigmoidoscopy 01/18/14 which showed stenosis of the colon at the splenic flexure with extrinsic compression.  Underwent diverting colostomy 01/20/14.   Assessment/Plan:   Principal Problem:  Left upper quadrant mass causing bowel obstruction, probable pancreatic cancer status post diverting colostomy   Left upper quadrant mass worrisome for pancreatic cancer. CEA WNL. CA 19-9 3434. CA 125 is 64.   Status post flexible sigmoidoscopy 01/18/14 showing stenosis of the colon at the splenic flexure with extrinsic compression (unable to tolerate prep for full colonoscopy).  Status post diverting colostomy 01/20/14. Doing well postoperatively. Diet advanced to full liquid.  Dr. [name] plans palliative chemotherapy once patient stable.  Active Problems: Hypokalemia  Potassium added to IV fluids.  Give 40 mEq oral replacement today for persistent hypokalemia.   Postoperative urinary retention  Resolved, Foley successfully discontinued 01/22/14 after a voiding trial.  Acute kidney injury  Creatinine bump noted 01/19/14. Possibly secondary to contrast nephropathy. BP medications including lisinopril/Maxide are currently being held and the patient has been gently hydrated.  KVO IVF.  Creatinine back to baseline by  01/23/14.  Continue to hold lisinopril and Maxzide for now.  Essential hypertension   Resume home medications when renal function stable: Maxzide and Lisinopril. Continue hydralazine 5 mg IV every 6 hours PRN.   Diabetes mellitus   Currently on insulin sensitive SSI.  CBG range on 98-124.  DVT Prophylaxis   Continue Lovenox.    Code Status: Full. Family Communication: Granddaughter updated at the bedside. Disposition Plan: Home when stable.   IV Access:    Peripheral IV  Port-A-Cath placed 01/20/14   Procedures and diagnostic studies:   Ct Abdomen Pelvis W Contrast 01/14/2014 Left upper quadrant soft tissue mass causing obstruction of the colon at the level of the splenic flexure. The mass measures approximately 5.7 cm in greatest dimensions. Exact origin of the tumor is not entirely clear by CT. 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 Second most likely origin would be the colon. No associated colonic perforation is identified by CT. No associated enlarged lymph nodes or distant metastatic lesions are identified in the abdomen or pelvis.   Dg Abd Acute W/chest 01/14/2014 Negative abdominal radiographs. No acute cardiopulmonary disease.   Dg Abd 2 Views 01/15/2014 : No evidence of small bowel obstruction or free air.   Dg Abd 01/16/2014: No evidence of bowel obstruction.  Flexible sigmoidoscopy 01/18/14: The colonoscope was advanced to approximately the region of the splenic flexure. There was stenosis of the colon with hyperemic mucosa suggesting extrinsic compression. The scope would not pass  beyond. No obvious primary mucosal lesion. Left-sided diverticulosis present.  Laparoscopic colostomy and insertion of Port-A-Cath right internal jugular done by Dr. Excell Seltzer 0/86/57: No complications noted.  Medical Consultants:    Dr. Heath Lark, Oncology.  Dr. Scarlette Shorts, GI.  Dr. Excell Seltzer, Surgery.   Other Consultants:     Physical therapy: Home health PT with 24-hour supervision/assistance.  Walker with 5 inch wheels.   Anti-Infectives:    None.  Subjective:   Karen Arias continues to have postoperative abdominal discomfort and some mild nausea, no vomiting. Brace her abdominal pain as 7/10. Feels a bit weak.  Objective:    Filed Vitals:   01/23/14 0440 01/23/14 1445 01/23/14 2025 01/24/14 0450  BP: 132/64 148/74 145/61 141/77  Pulse: 98 63 85 83  Temp: 99 F (37.2 C) 98.5 F (36.9 C) 98.4 F (36.9 C) 98.9 F (37.2 C)  TempSrc: Oral Oral Oral Oral  Resp: 16 16 16 16   Height:      Weight: 59.6 kg (131 lb 6.3 oz)     SpO2: 100% 99% 99% 100%    Intake/Output Summary (Last 24 hours) at 01/24/14 0757 Last data filed at 01/23/14 2131  Gross per 24 hour  Intake    240 ml  Output    550 ml  Net   -310 ml    Exam: Gen:  NAD Cardiovascular:  RRR, 2/6 systolic ejection murmur Respiratory:  Lungs CTAB Gastrointestinal:  Abdomen softly distended, + BS, ostomy site with swollen stoma Extremities:  1+ edema   Data Reviewed:    Labs: Basic Metabolic Panel:  Recent Labs Lab 01/19/14 0353 01/20/14 0350 01/21/14 0400 01/23/14 0418 01/24/14 0418  NA 131* 135* 133* 137 136*  K 4.0 3.8 4.4 3.4* 3.6*  CL 94* 100 99 99 100  CO2 22 19 20 23 20   GLUCOSE 120* 108* 186* 101* 95  BUN 34* 35* 29* 13 14  CREATININE 1.34* 1.13* 1.12* 0.83 0.77  CALCIUM 9.8 9.1 8.3* 8.3* 8.7   GFR Estimated Creatinine Clearance: 42.1 ml/min (by C-G formula based on Cr of 0.77).  CBC:  Recent Labs Lab 01/19/14 0353 01/21/14 0400 01/23/14 0418 01/24/14 0418  WBC 3.9* 4.0 4.5 5.5  HGB 12.2 10.7* 10.4* 11.2*  HCT 36.5 32.5* 30.6* 34.4*  MCV 88.8 89.8 88.7 90.3  PLT 239 223 202 215   CBG:  Recent Labs Lab 01/23/14 0724 01/23/14 1200 01/23/14 1648 01/23/14 2124 01/24/14 0730  GLUCAP 101* 107* 14* 77 101*   Microbiology Recent Results (from the past 240 hour(s))  SURGICAL PCR  SCREEN     Status: None   Collection Time    01/19/14  8:10 PM      Result Value Ref Range Status   MRSA, PCR NEGATIVE  NEGATIVE Final   Staphylococcus aureus NEGATIVE  NEGATIVE Final   Comment:            The Xpert SA Assay (FDA     approved for NASAL specimens     in patients over 35 years of age),     is one component of     a comprehensive surveillance     program.  Test performance has     been validated by [employer] for patients greater     than or equal to 21 year old.     It is not intended     to diagnose infection nor to     guide or monitor treatment.     Medications:   . enoxaparin (LOVENOX) injection  40 mg Subcutaneous Q24H  . insulin aspart  0-9 Units Subcutaneous TID WC  . pantoprazole  40 mg Oral Daily   Continuous Infusions: .  0.9 % NaCl with KCl 20 mEq / L 75 mL/hr at 01/23/14 0827    Time spent: 25 minutes.    LOS: 10 days   [facility] Pager [phone]. If unable to reach me by pager, please call my cell phone at [phone].  *Please refer to [email or web address], password [email or web address] to get updated schedule on who will round on this patient, as hospitalists switch teams weekly. If 7PM-7AM, please contact night-coverage at [email or web address], password [email or web address] for any overnight needs.  01/24/2014, 7:57 AM

## 2014-01-24 NOTE — Consult Note (Signed)
WOC ostomy follow up Stoma type/location: Request received today from Saverio Danker, [place] per Dr. Rosendo Gros to remove ostomy rod tomorrow with routine pouching change.  I will plan to do this tomorrow afternoon, Tuesday 01/25/14. Stomal assessment/size: Not measured today. Peristomal assessment: Not seen as pouch is not changed today. Treatment options for stomal/peristomal skin: None indicated today. Output: Scant serosanguinous Ostomy pouching: 2pc.  Education provided: Explanation of procedure to take place tomorrow. Enrolled patient in [facility] Start Discharge program: No Will see Tuesday afternoon for pouch change and rod removal. Thanks, Maudie Flakes, MSN, RN, [place], [place], [place] [phone])

## 2014-01-24 NOTE — Progress Notes (Signed)
Physical Therapy Treatment Patient Details Name: Karen Arias MRN: [id number] DOB: 09-Jan-1929 Today's Date: 01/24/2014    History of Present Illness 80 yo female s/p lap colostomy, pancreatic Cancer.     PT Comments    Pt expreincing pain and nausea. Did walk80'.   Follow Up Recommendations  Home health PT;Supervision/Assistance - 24 hour     Equipment Recommendations  Rolling walker with 5" wheels    Recommendations for Other Services       Precautions / Restrictions Precautions Precautions: Fall Precaution Comments: adominal surgery; colostomy    Mobility  Bed Mobility Overal bed mobility: Needs Assistance Bed Mobility: Supine to Sit     Supine to sit: Min assist;HOB elevated     General bed mobility comments: Increased time.   Transfers Overall transfer level: Needs assistance Equipment used: Rolling walker (2 wheeled) Transfers: Sit to/from Stand Sit to Stand: Min guard         General transfer comment: close guard for safety  Ambulation/Gait Ambulation/Gait assistance: Min guard Ambulation Distance (Feet): 80 Feet Assistive device: Rolling walker (2 wheeled) Gait Pattern/deviations: Step-through pattern;Decreased stride length     General Gait Details: slow gait speed. fatigues easily. VC safe use of walker.    Stairs            Wheelchair Mobility    Modified Rankin (Stroke Patients Only)       Balance                                    Cognition Arousal/Alertness: Awake/alert                          Exercises      General Comments        Pertinent Vitals/Pain Pain Score: 7  Pain Location: abdomen Pain Descriptors / Indicators: Cramping Pain Intervention(s): Patient requesting pain meds-RN notified;Repositioned    Home Living                      Prior Function            PT Goals (current goals can now be found in the care plan section) Progress towards PT goals:  Progressing toward goals    Frequency  Min 3X/week    PT Plan Current plan remains appropriate    Co-evaluation             End of Session   Activity Tolerance: Patient limited by pain (and nausea) Patient left: in chair;with call bell/phone within reach;with family/visitor present     Time: 5681-2751 PT Time Calculation (min): 17 min  Charges:  $Gait Training: 8-22 mins                    G Codes:      Claretha Cooper 01/24/2014, 12:24 PM Tresa Endo PT [phone]

## 2014-01-24 NOTE — Progress Notes (Signed)
Pt resting comfortably,visitor at bedside.

## 2014-01-25 ENCOUNTER — Inpatient Hospital Stay (HOSPITAL_COMMUNITY): Payer: Medicare HMO

## 2014-01-25 LAB — BASIC METABOLIC PANEL
Anion gap: 13 (ref 5–15)
BUN: 12 mg/dL (ref 6–23)
CO2: 23 meq/L (ref 19–32)
Calcium: 9.4 mg/dL (ref 8.4–10.5)
Chloride: 99 mEq/L (ref 96–112)
Creatinine, Ser: 0.75 mg/dL (ref 0.50–1.10)
GFR calc Af Amer: 88 mL/min — ABNORMAL LOW (ref >90)
GFR, EST NON AFRICAN AMERICAN: 76 mL/min — AB (ref >90)
GLUCOSE: 109 mg/dL — AB (ref 70–99)
Potassium: 4.7 mEq/L (ref 3.7–5.3)
SODIUM: 135 meq/L — AB (ref 137–147)

## 2014-01-25 LAB — CBC
HEMATOCRIT: 32.8 % — AB (ref 36.0–46.0)
Hemoglobin: 11 g/dL — ABNORMAL LOW (ref 12.0–15.0)
MCH: 29.6 pg (ref 26.0–34.0)
MCHC: 33.5 g/dL (ref 30.0–36.0)
MCV: 88.4 fL (ref 78.0–100.0)
Platelets: 247 10*3/uL (ref 150–400)
RBC: 3.71 MIL/uL — ABNORMAL LOW (ref 3.87–5.11)
RDW: 13 % (ref 11.5–15.5)
WBC: 6.4 10*3/uL (ref 4.0–10.5)

## 2014-01-25 LAB — GLUCOSE, CAPILLARY
GLUCOSE-CAPILLARY: 125 mg/dL — AB (ref 70–99)
GLUCOSE-CAPILLARY: 102 mg/dL — AB (ref 70–99)
Glucose-Capillary: 112 mg/dL — ABNORMAL HIGH (ref 70–99)
Glucose-Capillary: 106 mg/dL — ABNORMAL HIGH (ref 70–99)

## 2014-01-25 MED ORDER — LISINOPRIL 2.5 MG PO TABS
2.5000 mg | ORAL_TABLET | Freq: Every morning | ORAL | Status: DC
  Administered 2014-01-25 – 2014-02-01 (×8): 2.5 mg via ORAL
  Filled 2014-01-25 (×8): qty 1

## 2014-01-25 MED ORDER — TRIAMTERENE-HCTZ 75-50 MG PO TABS
1.0000 | ORAL_TABLET | Freq: Every morning | ORAL | Status: DC
Start: 2014-01-25 — End: 2014-02-01
  Administered 2014-01-25 – 2014-02-01 (×8): 1 via ORAL
  Filled 2014-01-25 (×8): qty 1

## 2014-01-25 MED ORDER — ALPRAZOLAM 1 MG PO TABS
1.0000 mg | ORAL_TABLET | Freq: Every day | ORAL | Status: DC
  Administered 2014-01-25 – 2014-01-31 (×6): 1 mg via ORAL
  Filled 2014-01-25 (×6): qty 2

## 2014-01-25 MED ORDER — ATORVASTATIN CALCIUM 80 MG PO TABS
80.0000 mg | ORAL_TABLET | Freq: Every day | ORAL | Status: DC
  Administered 2014-01-25 – 2014-01-31 (×6): 80 mg via ORAL
  Filled 2014-01-25 (×9): qty 1

## 2014-01-25 MED ORDER — METOCLOPRAMIDE HCL 5 MG/ML IJ SOLN
10.0000 mg | Freq: Four times a day (QID) | INTRAMUSCULAR | Status: DC
  Administered 2014-01-25: 10 mg via INTRAVENOUS
  Filled 2014-01-25 (×5): qty 2

## 2014-01-25 NOTE — Progress Notes (Signed)
Progress Note   Karen Arias XHB:[id number] DOB: 19-Feb-1929 DOA: 01/14/2014 PCP: [name], MD   Brief Narrative:   Karen Arias is an 80 y.o. female with a PMH of hypertension, hiatal hernia (EGD in 09/2012) who followed with Dr. [name] for GI related issues who was admitted 01/14/14 with chief complaint of abdominal pain and constipation. She was found to have LUQ soft tissue mass (5.7 cm in diameter) on CT abdomen causing obstruction of the colon at the level of the splenic flexure, worrisome for malignancy. Her tumor marker CA 19-9 elevated, concerning for pancreatic cancer. Colonoscopy prep attempted but patient could not tolerate well. Underwent a flexible sigmoidoscopy 01/18/14 which showed stenosis of the colon at the splenic flexure with extrinsic compression.  Underwent diverting colostomy 01/20/14.   Assessment/Plan:   Principal Problem:  Left upper quadrant mass causing bowel obstruction, probable pancreatic cancer status post diverting colostomy   Left upper quadrant mass worrisome for pancreatic cancer. CEA WNL. CA 19-9 3434. CA 125 is 64.   Status post flexible sigmoidoscopy 01/18/14 showing stenosis of the colon at the splenic flexure with extrinsic compression (unable to tolerate prep for full colonoscopy).  Status post diverting colostomy 01/20/14.  Ostomy nurse following for education of care of ostomy.  Dr. [name] plans palliative chemotherapy once patient stable.  Patient with more abdominal distention and pain today, abdominal films show increased colonic distention with a partial colonic obstruction at the splenic flexure. Reglan started by general surgery.  Active Problems: Hypokalemia  Replaced.   Postoperative urinary retention  Resolved, Foley successfully discontinued 01/22/14 after a voiding trial.   Acute kidney injury  Creatinine bump noted 01/19/14. Possibly secondary to contrast nephropathy. BP medications including  lisinopril/Maxide are currently being held and the patient has been gently hydrated.  KVO IVF.  Creatinine back to baseline by 01/23/14.  Okay to resume lisinopril and Maxzide.  Essential hypertension   Resume home medications: Maxzide and Lisinopril. Continue hydralazine 5 mg IV every 6 hours PRN.   Diabetes mellitus   Currently on insulin sensitive SSI.  CBG range on 98-134.  DVT Prophylaxis   Continue Lovenox.    Code Status: Full. Family Communication: Granddaughter updated at the bedside. Disposition Plan: Home when stable.   IV Access:    Peripheral IV  Port-A-Cath placed 01/20/14   Procedures and diagnostic studies:   Ct Abdomen Pelvis W Contrast 01/14/2014 Left upper quadrant soft tissue mass causing obstruction of the colon at the level of the splenic flexure. The mass measures approximately 5.7 cm in greatest dimensions. Exact origin of the tumor is not entirely clear by CT. 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 Second most likely origin would be the colon. No associated colonic perforation is identified by CT. No associated enlarged lymph nodes or distant metastatic lesions are identified in the abdomen or pelvis.   Dg Abd Acute W/chest 01/14/2014 Negative abdominal radiographs. No acute cardiopulmonary disease.   Dg Abd 2 Views 01/15/2014 : No evidence of small bowel obstruction or free air.   Dg Abd 01/16/2014: No evidence of bowel obstruction.  Flexible sigmoidoscopy 01/18/14: The colonoscope was advanced to approximately the region of the splenic flexure. There was stenosis of the colon with hyperemic mucosa suggesting extrinsic compression. The scope would not pass  beyond. No obvious primary mucosal lesion. Left-sided diverticulosis present.  Laparoscopic colostomy and insertion of Port-A-Cath right internal jugular done by Dr. Excell Seltzer 12/21/15: No complications  noted.  Dg Abd Portable 1v 01/25/2014: Colonic  distention has developed since the prior study consistent with a partial colonic obstruction at the level of the splenic flexure.     Medical Consultants:    Dr. Heath Lark, Oncology.  Dr. Scarlette Shorts, GI.  Dr. Excell Seltzer, Surgery.   Other Consultants:    Physical therapy: Home health PT with 24-hour supervision/assistance.  Walker with 5 inch wheels.   Anti-Infectives:    None.  Subjective:   KRYSTI HICKLING has had more abdominal pain and distention over the past 24 hours. No frank vomiting. Denies any flatus, and ostomy pouch draining brown liquid stool.  Objective:    Filed Vitals:   01/24/14 1416 01/24/14 2127 01/25/14 0413 01/25/14 0500  BP: 155/80 154/78 158/84   Pulse: 83 72 110 84  Temp: 98.4 F (36.9 C) 98.3 F (36.8 C) 99 F (37.2 C)   TempSrc: Oral Oral Oral   Resp: 16 18 16    Height:      Weight:      SpO2: 99% 100% 98%     Intake/Output Summary (Last 24 hours) at 01/25/14 0747 Last data filed at 01/25/14 0413  Gross per 24 hour  Intake    870 ml  Output   1000 ml  Net   -130 ml    Exam: Gen:  NAD Cardiovascular:  RRR, 2/6 systolic ejection murmur Respiratory:  Lungs CTAB Gastrointestinal:  Abdomen firm/distended, + BS, ostomy site with swollen stoma and brown drainage in bag Extremities:  1+ edema   Data Reviewed:    Labs: Basic Metabolic Panel:  Recent Labs Lab 01/20/14 0350 01/21/14 0400 01/23/14 0418 01/24/14 0418 01/25/14 0400  NA 135* 133* 137 136* 135*  K 3.8 4.4 3.4* 3.6* 4.7  CL 100 99 99 100 99  CO2 19 20 23 20 23   GLUCOSE 108* 186* 101* 95 109*  BUN 35* 29* 13 14 12   CREATININE 1.13* 1.12* 0.83 0.77 0.75  CALCIUM 9.1 8.3* 8.3* 8.7 9.4   GFR Estimated Creatinine Clearance: 42.1 ml/min (by C-G formula based on Cr of 0.75).  CBC:  Recent Labs Lab 01/19/14 0353 01/21/14 0400 01/23/14 0418 01/24/14 0418 01/25/14 0400  WBC 3.9* 4.0 4.5 5.5 6.4  HGB 12.2 10.7* 10.4* 11.2* 11.0*  HCT 36.5 32.5*  30.6* 34.4* 32.8*  MCV 88.8 89.8 88.7 90.3 88.4  PLT 239 223 202 215 247   CBG:  Recent Labs Lab 01/23/14 2124 01/24/14 0730 01/24/14 1137 01/24/14 1608 01/24/14 2126  GLUCAP 98 101* 134* 119* 130*   Microbiology Recent Results (from the past 240 hour(s))  SURGICAL PCR SCREEN     Status: None   Collection Time    01/19/14  8:10 PM      Result Value Ref Range Status   MRSA, PCR NEGATIVE  NEGATIVE Final   Staphylococcus aureus NEGATIVE  NEGATIVE Final   Comment:            The Xpert SA Assay (FDA     approved for NASAL specimens     in patients over 10 years of age),     is one component of     a comprehensive surveillance     program.  Test performance has     been validated by [employer] for patients greater     than or equal to 71 year old.     It is not intended     to diagnose infection nor  to     guide or monitor treatment.     Medications:   . enoxaparin (LOVENOX) injection  40 mg Subcutaneous Q24H  . insulin aspart  0-9 Units Subcutaneous TID WC  . pantoprazole  40 mg Oral Daily   Continuous Infusions: . 0.9 % NaCl with KCl 20 mEq / L 20 mL/hr at 01/24/14 1250    Time spent: 25 minutes.    LOS: 11 days   [facility] Pager [phone]. If unable to reach me by pager, please call my cell phone at [phone].  *Please refer to [email or web address], password [email or web address] to get updated schedule on who will round on this patient, as hospitalists switch teams weekly. If 7PM-7AM, please contact night-coverage at [email or web address], password [email or web address] for any overnight needs.  01/25/2014, 7:47 AM

## 2014-01-25 NOTE — Care Management Note (Signed)
CARE MANAGEMENT NOTE 01/25/2014  Patient:  Karen Arias, Karen Arias   Account Number:  [id number]  Date Initiated:  01/24/2014  Documentation initiated by:  Leafy Kindle  Subjective/Objective Assessment:   Pt admitted with abd pain     Action/Plan:   from home alone   Anticipated DC Date:  01/27/2014   Anticipated DC Plan:  [place]  CM consult      Choice offered to / List presented to:  C-1 Patient        [place] arranged  HH-1 RN  [place].   Status of service:  In process, will continue to follow Medicare Important Message given?  YES (If response is "NO", the following Medicare IM given date fields will be blank) Date Medicare IM given:  01/24/2014 Medicare IM given by:  Leafy Kindle Date Additional Medicare IM given:   Additional Medicare IM given by:    Discharge Disposition:    Per UR Regulation:  Reviewed for med. necessity/level of care/duration of stay  If discussed at [place] of Stay Meetings, dates discussed:   01/20/2014  01/25/2014    Comments:  01/24/14 Marney Doctor Rn,BSN,NCM Pt from home alone but family checks in on her often.  PT is recommending HHPT.  Met with pt and daughter.  Pt states that she would like to use [facility] for her [place] because some of her family has used them in the past.  Daughter was concerned about pt going home with the new ostomy. Daughter and Pt were informed that a HHRN could help reinforce ostomy teaching in the home at [place].  [facility] rep called to inform of referral.  Will need MD orders for [facility] and PT at DC.  CM will continue to follow for DC needs.

## 2014-01-25 NOTE — Progress Notes (Signed)
I have seen and examined the pt and agree with PA-Osborne's progress note. Pt with some nausea this AM and min PO. Will start reglan to see if this helps with nausea

## 2014-01-25 NOTE — Consult Note (Addendum)
WOC ostomy follow up Stoma type/location: LUQ Colostomy Stomal assessment/size: 2 and 3/4  inches round, edematous, with rod intact (removed later in the visit).  Spontaneously maturing. Peristomal assessment: intact, clear Treatment options for stomal/peristomal skin: Skin barrier ring around base of edematous stoma Output: Thin light brown liquid effluent Ostomy pouching: 2pc. , 4-inch post-op pouching system.  May be able to fit into 2 and 3/4 inch pouching system soon. Education provided: Patient's friend here for support in ostomy rod removal and was able to observe routine pouch change technique.  Patient was premedicated  And rod removed without discomfort or bleeding.  Patient's friend observed removal of rod, then removal of old pouching system, resizing of stoma, pouching system preparation (tracing and cutting) and application of pouching system following placement of skin barrier ring.  (1 and a 1/4 skin barrier ring was needed; I cut and stretched the skin barrier ring to encircle the stoma and came up lightly short.  A piece of a second skin barrier ring was required to fill the defect. New skin barrier placed, then pouch attached.  Patient and patient's friend taught the Lock and Roll closure mechanism and the criteria for emptying pouch (when 1/3 to 1/2 full of effluent of flatus).  A toilet paper wick method for pouch tail closure cleansing was demonstrated and patient's friend was urged to make several for patient to use at home (she is in agreement and will make some beginning this pm).  Both agree they would like to see another demonstration of a pouch change prior to discharge. It would be beneficial to resize patient's stoma to see if she could wear a 2 and 3/4 inch system prior to discharge. (The stomal edema will need to decrease to reveal a stoma sized 2 and 1/4 inch or less for this to be possible.) Several of the next smaller size (2 and 2/4 inch pouching systems with skin barrier  rings) provided to bedside today.  If she is still the same size, there are still 2 of the 4-inch systems in the room. Enrolled patient in [place] Start Discharge program: No (I am unsure of what size to order.  May be able to tell with next pouch change.) I will be out of the office for the next several days, but my partners will follow this nice woman until time of discharge in my absence. Thank you for inviting [place] to consult on and participate in her post operative care. Thanks, Maudie Flakes, MSN, RN, [place], [place], [place] [phone])

## 2014-01-25 NOTE — Progress Notes (Signed)
Patient ID: Karen Arias, female   DOB: [birth year], 80 y.o.   MRN: [id number] 5 Days Post-Op  Subjective: Pt didn't eat yesterday.  Feels quite bloated and still with nausea.  Pain is controlled though  Objective: Vital signs in last 24 hours: Temp:  [98.3 F (36.8 C)-99 F (37.2 C)] 99 F (37.2 C) (09/15 0413) Pulse Rate:  [72-110] 84 (09/15 0500) Resp:  [16-18] 16 (09/15 0413) BP: (154-158)/(78-84) 158/84 mmHg (09/15 0413) SpO2:  [98 %-100 %] 98 % (09/15 0413) Last BM Date: 01/24/14 (colostomy)  Intake/Output from previous day: 09/14 0701 - 09/15 0700 In: 870 [P.O.:270; I.V.:600] Out: 1000 [Urine:900; Stool:100] Intake/Output this shift:    PE: Abd: distended and tight, some bowel is palpable on the right side of the abdomen.  Stoma is cleaning up some, but still edematous.  Has some liquid feculent output.  Hypoactive BS  Chest" PAC insertion site clean  Lab Results:   Recent Labs  01/24/14 0418 01/25/14 0400  WBC 5.5 6.4  HGB 11.2* 11.0*  HCT 34.4* 32.8*  PLT 215 247   BMET  Recent Labs  01/24/14 0418 01/25/14 0400  NA 136* 135*  K 3.6* 4.7  CL 100 99  CO2 20 23  GLUCOSE 95 109*  BUN 14 12  CREATININE 0.77 0.75  CALCIUM 8.7 9.4   PT/INR No results found for this basename: LABPROT, INR,  in the last 72 hours CMP     Component Value Date/Time   NA 135* 01/25/2014 0400   K 4.7 01/25/2014 0400   CL 99 01/25/2014 0400   CO2 23 01/25/2014 0400   GLUCOSE 109* 01/25/2014 0400   BUN 12 01/25/2014 0400   CREATININE 0.75 01/25/2014 0400   CALCIUM 9.4 01/25/2014 0400   PROT 8.1 01/14/2014 1402   ALBUMIN 3.9 01/14/2014 1402   AST 23 01/14/2014 1402   ALT 13 01/14/2014 1402   ALKPHOS 56 01/14/2014 1402   BILITOT 0.4 01/14/2014 1402   GFRNONAA 76* 01/25/2014 0400   GFRAA 88* 01/25/2014 0400   Lipase     Component Value Date/Time   LIPASE 32 01/14/2014 1402       Studies/Results: No results found.  Anti-infectives: Anti-infectives   Start     Dose/Rate Route  Frequency Ordered Stop   01/20/14 0600  [MAR Hold]  cefoTEtan (CEFOTAN) 2 g in dextrose 5 % 50 mL IVPB     (On MAR Hold since 01/20/14 1244)  Comments:  Pharmacy may adjust dose strength for optimal dosing.   Send with patient on call to the OR.  Anesthesia to complete antibiotic administration <56min prior to incision per [facility].   2 g 100 mL/hr over 30 Minutes Intravenous On call to O.R. 01/19/14 1548 01/20/14 1325       Assessment/Plan  Cancer of the pancreas POD #4 s/p INSERTION PORT-A-CATH RIGHT INTERNAL JUGULAR  Obstruction of left colon POD #4 s/p LAPAROSCOPIC COLOSTOMY    Plan: 1. Patient appears slightly more distended today.  She is not taking in anything orally.  I have written for an abdominal film today to evaluate for ileus vs bowel gas pattern given malignancy and previous obstruction. 2. Would leave diet where it is, but may require backing off.  Will await films 3. Cont to mobilize  LOS: 11 days    Karen Arias 01/25/2014, 8:45 AM Pager: [phone]

## 2014-01-26 ENCOUNTER — Inpatient Hospital Stay (HOSPITAL_COMMUNITY): Payer: Medicare HMO | Admitting: Anesthesiology

## 2014-01-26 ENCOUNTER — Ambulatory Visit (HOSPITAL_COMMUNITY): Payer: Medicare HMO

## 2014-01-26 ENCOUNTER — Encounter (HOSPITAL_COMMUNITY): Payer: Self-pay | Admitting: Radiology

## 2014-01-26 ENCOUNTER — Encounter (HOSPITAL_COMMUNITY): Payer: Medicare HMO | Admitting: Anesthesiology

## 2014-01-26 ENCOUNTER — Encounter (HOSPITAL_COMMUNITY)
Admission: EM | Disposition: A | Discharge status: Skilled Nursing Facility | Payer: Self-pay | Source: Home / Self Care | Attending: Internal Medicine

## 2014-01-26 HISTORY — PX: LAPAROTOMY: SHX154

## 2014-01-26 LAB — BASIC METABOLIC PANEL
Anion gap: 16 — ABNORMAL HIGH (ref 5–15)
BUN: 10 mg/dL (ref 6–23)
CO2: 22 meq/L (ref 19–32)
CREATININE: 0.88 mg/dL (ref 0.50–1.10)
Calcium: 10.9 mg/dL — ABNORMAL HIGH (ref 8.4–10.5)
Chloride: 93 mEq/L — ABNORMAL LOW (ref 96–112)
GFR calc Af Amer: 69 mL/min — ABNORMAL LOW (ref >90)
GFR calc non Af Amer: 59 mL/min — ABNORMAL LOW (ref >90)
Glucose, Bld: 104 mg/dL — ABNORMAL HIGH (ref 70–99)
Potassium: 4.7 mEq/L (ref 3.7–5.3)
Sodium: 131 mEq/L — ABNORMAL LOW (ref 137–147)

## 2014-01-26 LAB — CBC
HCT: 40.6 % (ref 36.0–46.0)
HEMOGLOBIN: 13.4 g/dL (ref 12.0–15.0)
MCH: 29.8 pg (ref 26.0–34.0)
MCHC: 33 g/dL (ref 30.0–36.0)
MCV: 90.2 fL (ref 78.0–100.0)
Platelets: 284 10*3/uL (ref 150–400)
RBC: 4.5 MIL/uL (ref 3.87–5.11)
RDW: 13.1 % (ref 11.5–15.5)
WBC: 9.5 10*3/uL (ref 4.0–10.5)

## 2014-01-26 LAB — GLUCOSE, CAPILLARY
GLUCOSE-CAPILLARY: 100 mg/dL — AB (ref 70–99)
GLUCOSE-CAPILLARY: 119 mg/dL — AB (ref 70–99)
Glucose-Capillary: 108 mg/dL — ABNORMAL HIGH (ref 70–99)
Glucose-Capillary: 114 mg/dL — ABNORMAL HIGH (ref 70–99)
Glucose-Capillary: 120 mg/dL — ABNORMAL HIGH (ref 70–99)

## 2014-01-26 LAB — TYPE AND SCREEN
ABO/RH(D): A POS
Antibody Screen: NEGATIVE

## 2014-01-26 LAB — PROTIME-INR
INR: 1.05 (ref 0.00–1.49)
PROTHROMBIN TIME: 13.7 s (ref 11.6–15.2)

## 2014-01-26 LAB — APTT: aPTT: 29 seconds (ref 24–37)

## 2014-01-26 LAB — ABO/RH: ABO/RH(D): A POS

## 2014-01-26 SURGERY — LAPAROTOMY, EXPLORATORY
Anesthesia: General | Site: Abdomen

## 2014-01-26 MED ORDER — CEFOTETAN DISODIUM-DEXTROSE 2-2.08 GM-% IV SOLR
INTRAVENOUS | Status: AC
  Filled 2014-01-26: qty 50

## 2014-01-26 MED ORDER — HYDROMORPHONE HCL 1 MG/ML IJ SOLN
0.2500 mg | INTRAMUSCULAR | Status: DC | PRN
  Administered 2014-01-26 (×4): 0.5 mg via INTRAVENOUS

## 2014-01-26 MED ORDER — MORPHINE SULFATE (PF) 1 MG/ML IV SOLN
INTRAVENOUS | Status: AC
  Filled 2014-01-26: qty 25

## 2014-01-26 MED ORDER — PROMETHAZINE HCL 25 MG/ML IJ SOLN
INTRAMUSCULAR | Status: AC
  Filled 2014-01-26: qty 1

## 2014-01-26 MED ORDER — ESMOLOL HCL 10 MG/ML IV SOLN
INTRAVENOUS | Status: DC | PRN
  Administered 2014-01-26 (×2): 10 mg via INTRAVENOUS

## 2014-01-26 MED ORDER — ENOXAPARIN SODIUM 40 MG/0.4ML ~~LOC~~ SOLN
40.0000 mg | Freq: Once | SUBCUTANEOUS | Status: AC
  Administered 2014-01-27: 40 mg via SUBCUTANEOUS
  Filled 2014-01-26: qty 0.4

## 2014-01-26 MED ORDER — FENTANYL CITRATE 0.05 MG/ML IJ SOLN
INTRAMUSCULAR | Status: DC | PRN
  Administered 2014-01-26: 100 ug via INTRAVENOUS
  Administered 2014-01-26 (×2): 50 ug via INTRAVENOUS

## 2014-01-26 MED ORDER — PROPOFOL 10 MG/ML IV BOLUS
INTRAVENOUS | Status: DC | PRN
  Administered 2014-01-26: 100 mg via INTRAVENOUS

## 2014-01-26 MED ORDER — HYDROMORPHONE HCL 1 MG/ML IJ SOLN
INTRAMUSCULAR | Status: AC
  Filled 2014-01-26: qty 1

## 2014-01-26 MED ORDER — DIPHENHYDRAMINE HCL 50 MG/ML IJ SOLN: 12.5000 mg | Freq: Four times a day (QID) | INTRAMUSCULAR | Status: DC | PRN

## 2014-01-26 MED ORDER — ROCURONIUM BROMIDE 100 MG/10ML IV SOLN
INTRAVENOUS | Status: DC | PRN
  Administered 2014-01-26: 20 mg via INTRAVENOUS

## 2014-01-26 MED ORDER — LACTATED RINGERS IV SOLN: INTRAVENOUS | Status: DC

## 2014-01-26 MED ORDER — ONDANSETRON HCL 4 MG/2ML IJ SOLN: 4.0000 mg | Freq: Four times a day (QID) | INTRAMUSCULAR | Status: DC | PRN

## 2014-01-26 MED ORDER — DIPHENHYDRAMINE HCL 12.5 MG/5ML PO ELIX: 12.5000 mg | ORAL_SOLUTION | Freq: Four times a day (QID) | ORAL | Status: DC | PRN

## 2014-01-26 MED ORDER — DEXTROSE 5 % IV SOLN
2.0000 g | INTRAVENOUS | Status: AC
  Administered 2014-01-26: 2 g via INTRAVENOUS

## 2014-01-26 MED ORDER — 0.9 % SODIUM CHLORIDE (POUR BTL) OPTIME
TOPICAL | Status: DC | PRN
  Administered 2014-01-26: 4000 mL

## 2014-01-26 MED ORDER — SUCCINYLCHOLINE CHLORIDE 20 MG/ML IJ SOLN
INTRAMUSCULAR | Status: DC | PRN
  Administered 2014-01-26: 100 mg via INTRAVENOUS

## 2014-01-26 MED ORDER — GLYCOPYRROLATE 0.2 MG/ML IJ SOLN
INTRAMUSCULAR | Status: DC | PRN
  Administered 2014-01-26: 0.4 mg via INTRAVENOUS

## 2014-01-26 MED ORDER — NALOXONE HCL 0.4 MG/ML IJ SOLN: 0.4000 mg | INTRAMUSCULAR | Status: DC | PRN

## 2014-01-26 MED ORDER — METOPROLOL TARTRATE 1 MG/ML IV SOLN
2.0000 mg | INTRAVENOUS | Status: DC
  Administered 2014-01-26: 2 mg via INTRAVENOUS

## 2014-01-26 MED ORDER — IOHEXOL 300 MG/ML  SOLN
25.0000 mL | Freq: Once | INTRAMUSCULAR | Status: AC | PRN
  Administered 2014-01-26: 25 mL via ORAL

## 2014-01-26 MED ORDER — PHENYLEPHRINE 40 MCG/ML (10ML) SYRINGE FOR IV PUSH (FOR BLOOD PRESSURE SUPPORT)
PREFILLED_SYRINGE | INTRAVENOUS | Status: AC
  Filled 2014-01-26: qty 10

## 2014-01-26 MED ORDER — METOPROLOL TARTRATE 1 MG/ML IV SOLN
INTRAVENOUS | Status: AC
Start: 2014-01-26 — End: 2014-01-27
  Filled 2014-01-26: qty 5

## 2014-01-26 MED ORDER — ESMOLOL HCL 10 MG/ML IV SOLN
INTRAVENOUS | Status: AC
  Filled 2014-01-26: qty 10

## 2014-01-26 MED ORDER — GLYCOPYRROLATE 0.2 MG/ML IJ SOLN
INTRAMUSCULAR | Status: AC
  Filled 2014-01-26: qty 2

## 2014-01-26 MED ORDER — ONDANSETRON HCL 4 MG/2ML IJ SOLN
INTRAMUSCULAR | Status: AC
  Filled 2014-01-26: qty 2

## 2014-01-26 MED ORDER — ENOXAPARIN SODIUM 40 MG/0.4ML ~~LOC~~ SOLN
40.0000 mg | SUBCUTANEOUS | Status: DC
  Administered 2014-01-27 – 2014-01-31 (×5): 40 mg via SUBCUTANEOUS
  Filled 2014-01-26 (×6): qty 0.4

## 2014-01-26 MED ORDER — SODIUM CHLORIDE 0.9 % IJ SOLN: 9.0000 mL | INTRAMUSCULAR | Status: DC | PRN

## 2014-01-26 MED ORDER — LACTATED RINGERS IV SOLN
INTRAVENOUS | Status: DC | PRN
  Administered 2014-01-26 (×2): via INTRAVENOUS

## 2014-01-26 MED ORDER — FENTANYL CITRATE 0.05 MG/ML IJ SOLN
INTRAMUSCULAR | Status: AC
  Filled 2014-01-26: qty 2

## 2014-01-26 MED ORDER — MORPHINE SULFATE (PF) 1 MG/ML IV SOLN
INTRAVENOUS | Status: DC
  Administered 2014-01-26: 18:00:00 via INTRAVENOUS
  Administered 2014-01-26: 1 mg via INTRAVENOUS
  Administered 2014-01-27: 3 mg via INTRAVENOUS
  Administered 2014-01-27: 2 mg via INTRAVENOUS
  Administered 2014-01-27: 4 mg via INTRAVENOUS
  Administered 2014-01-27: 1 mg via INTRAVENOUS

## 2014-01-26 MED ORDER — IOHEXOL 300 MG/ML  SOLN
100.0000 mL | Freq: Once | INTRAMUSCULAR | Status: AC | PRN
  Administered 2014-01-26: 100 mL via INTRAVENOUS

## 2014-01-26 MED ORDER — PHENYLEPHRINE HCL 10 MG/ML IJ SOLN
INTRAMUSCULAR | Status: DC | PRN
  Administered 2014-01-26 (×4): 80 ug via INTRAVENOUS

## 2014-01-26 MED ORDER — NEOSTIGMINE METHYLSULFATE 10 MG/10ML IV SOLN
INTRAVENOUS | Status: DC | PRN
  Administered 2014-01-26: 3 mg via INTRAVENOUS

## 2014-01-26 MED ORDER — PROMETHAZINE HCL 25 MG/ML IJ SOLN
6.2500 mg | INTRAMUSCULAR | Status: AC | PRN
  Administered 2014-01-26 (×2): 12.5 mg via INTRAVENOUS

## 2014-01-26 MED ORDER — ONDANSETRON HCL 4 MG/2ML IJ SOLN
INTRAMUSCULAR | Status: DC | PRN
  Administered 2014-01-26: 4 mg via INTRAVENOUS

## 2014-01-26 SURGICAL SUPPLY — 66 items
APPLICATOR COTTON TIP 6IN STRL (MISCELLANEOUS) ×3 IMPLANT
BLADE CLIPPER SURG (BLADE) IMPLANT
BLADE EXTENDED COATED 6.5IN (ELECTRODE) IMPLANT
BLADE HEX COATED 2.75 (ELECTRODE) ×3 IMPLANT
BLADE SURG SZ10 CARB STEEL (BLADE) ×3 IMPLANT
CANISTER SUCTION 2500CC (MISCELLANEOUS) IMPLANT
CHLORAPREP W/TINT 26ML (MISCELLANEOUS) ×3 IMPLANT
CLIP TI LARGE 6 (CLIP) IMPLANT
COVER MAYO STAND STRL (DRAPES) ×3 IMPLANT
COVER SURGICAL LIGHT HANDLE (MISCELLANEOUS) ×6 IMPLANT
DRAPE LAPAROSCOPIC ABDOMINAL (DRAPES) ×3 IMPLANT
DRAPE LG THREE QUARTER DISP (DRAPES) ×3 IMPLANT
DRAPE WARM FLUID 44X44 (DRAPE) ×3 IMPLANT
ELECT REM PT RETURN 9FT ADLT (ELECTROSURGICAL) ×3
ELECTRODE REM PT RTRN 9FT ADLT (ELECTROSURGICAL) ×1 IMPLANT
GAUZE SPONGE 4X4 12PLY STRL (GAUZE/BANDAGES/DRESSINGS) ×3 IMPLANT
GLOVE BIO SURGEON STRL SZ7.5 (GLOVE) ×6 IMPLANT
GLOVE BIOGEL PI IND STRL 7.0 (GLOVE) ×1 IMPLANT
GLOVE BIOGEL PI INDICATOR 7.0 (GLOVE) ×2
GOWN STRL REUS W/ TWL XL LVL3 (GOWN DISPOSABLE) ×1 IMPLANT
GOWN STRL REUS W/TWL LRG LVL3 (GOWN DISPOSABLE) ×9 IMPLANT
GOWN STRL REUS W/TWL XL LVL3 (GOWN DISPOSABLE) ×5 IMPLANT
KIT BASIN OR (CUSTOM PROCEDURE TRAY) ×3 IMPLANT
KIT ROOM TURNOVER OR (KITS) ×3 IMPLANT
LEGGING LITHOTOMY PAIR STRL (DRAPES) IMPLANT
LIGASURE IMPACT 36 18CM CVD LR (INSTRUMENTS) ×3 IMPLANT
NS IRRIG 1000ML POUR BTL (IV SOLUTION) ×6 IMPLANT
PACK GENERAL/GYN (CUSTOM PROCEDURE TRAY) ×3 IMPLANT
PAD ABD 8X10 STRL (GAUZE/BANDAGES/DRESSINGS) ×3 IMPLANT
PAD ARMBOARD 7.5X6 YLW CONV (MISCELLANEOUS) IMPLANT
POUCH OSTOMY 1 3/4  H (OSTOMY) ×6 IMPLANT
RELOAD PROXIMATE 75MM BLUE (ENDOMECHANICALS) ×3 IMPLANT
SHEARS HARMONIC ACE PLUS 36CM (ENDOMECHANICALS) IMPLANT
SOL PREP PROV IODINE SCRUB 4OZ (MISCELLANEOUS) ×3 IMPLANT
SPECIMEN JAR LARGE (MISCELLANEOUS) IMPLANT
SPONGE LAP 18X18 X RAY DECT (DISPOSABLE) ×3 IMPLANT
SPRAY BETADINE AEROSOL 3OZ (MISCELLANEOUS) ×3 IMPLANT
STAPLER PROXIMATE 75MM BLUE (STAPLE) ×3 IMPLANT
STAPLER VISISTAT 35W (STAPLE) IMPLANT
SUCTION POOLE TIP (SUCTIONS) IMPLANT
SUT ETHIBOND 0 (SUTURE) ×3 IMPLANT
SUT ETHIBOND 0 MO6 C/R (SUTURE) ×3 IMPLANT
SUT ETHIBOND CT1 BRD #0 30IN (SUTURE) ×3 IMPLANT
SUT PDS AB 1 CT1 27 (SUTURE) ×6 IMPLANT
SUT PDS AB 1 CTX 36 (SUTURE) IMPLANT
SUT PDS AB 1 TP1 96 (SUTURE) IMPLANT
SUT PROLENE 2 0 BLUE (SUTURE) IMPLANT
SUT SILK 2 0 (SUTURE)
SUT SILK 2 0 SH CR/8 (SUTURE) ×3 IMPLANT
SUT SILK 2 0 TIES 10X30 (SUTURE) ×3 IMPLANT
SUT SILK 2 0SH CR/8 30 (SUTURE) IMPLANT
SUT SILK 2-0 18XBRD TIE 12 (SUTURE) IMPLANT
SUT SILK 2-0 30XBRD TIE 12 (SUTURE) IMPLANT
SUT SILK 3 0 (SUTURE) ×2
SUT SILK 3 0 SH CR/8 (SUTURE) ×3 IMPLANT
SUT SILK 3 0 TIES 10X30 (SUTURE) ×3 IMPLANT
SUT SILK 3-0 18XBRD TIE 12 (SUTURE) ×1 IMPLANT
SUT VIC AB 2-0 SH 27 (SUTURE) ×8
SUT VIC AB 2-0 SH 27X BRD (SUTURE) ×4 IMPLANT
SYR BULB IRRIGATION 50ML (SYRINGE) ×3 IMPLANT
TAPE CLOTH SURG 4X10 WHT LF (GAUZE/BANDAGES/DRESSINGS) ×3 IMPLANT
TOWEL OR 17X24 6PK STRL BLUE (TOWEL DISPOSABLE) IMPLANT
TOWEL OR 17X26 10 PK STRL BLUE (TOWEL DISPOSABLE) ×6 IMPLANT
TRAY FOLEY CATH 14FRSI W/METER (CATHETERS) ×3 IMPLANT
WATER STERILE IRR 1000ML POUR (IV SOLUTION) IMPLANT
YANKAUER SUCT BULB TIP NO VENT (SUCTIONS) ×3 IMPLANT

## 2014-01-26 NOTE — Anesthesia Postprocedure Evaluation (Signed)
  Anesthesia Post-op Note  Patient: Karen Arias  Procedure(s) Performed: Procedure(s) (LRB): EXPLORATORY LAPAROTOMY, COLOSTOMY, EXCISION OF MUCUS FISTULA AND END COLOSTOMY (N/A)  Patient Location: PACU  Anesthesia Type: General  Level of Consciousness: awake and alert   Airway and Oxygen Therapy: Patient Spontanous Breathing  Post-op Pain: mild  Post-op Assessment: Post-op Vital signs reviewed, Patient's Cardiovascular Status Stable, Respiratory Function Stable, Patent Airway and No signs of Nausea or vomiting  Last Vitals:  Filed Vitals:   01/26/14 1941  BP: 142/73  Pulse: 108  Temp: 36.7 C  Resp: 14    Post-op Vital Signs: stable   Complications: No apparent anesthesia complications

## 2014-01-26 NOTE — Progress Notes (Signed)
I have seen and examined the pt and agree with PA-Osborne's progress note. Some liquid in bag, however no gas. CT to eval abd

## 2014-01-26 NOTE — Progress Notes (Signed)
Patient ID: Karen Arias, female   DOB: 1928/10/06, 80 y.o.   MRN: [id number] 6 Days Post-Op  Subjective: Pt still nauseated.  Not eating.  Hasn't vomited yet.  Still not passing anything out of her colostomy  Objective: Vital signs in last 24 hours: Temp:  [98.6 F (37 C)-98.7 F (37.1 C)] 98.6 F (37 C) (09/16 0600) Pulse Rate:  [75-86] 75 (09/16 0600) Resp:  [14-18] 18 (09/16 0600) BP: (152-154)/(68-79) 154/68 mmHg (09/16 0600) SpO2:  [99 %-100 %] 99 % (09/16 0600) Last BM Date: 01/25/14  Intake/Output from previous day: 09/15 0701 - 09/16 0700 In: 0  Out: 1350 [Urine:1230; Stool:120] Intake/Output this shift:    PE: Abd: still distended.  Colon palpable.  Ostomy digitized today.  Fascia is tight, but not obstructing.  Stoma remains the same, viable Heart: regular Chest: PAC in place, site clean  Lab Results:   Recent Labs  01/24/14 0418 01/25/14 0400  WBC 5.5 6.4  HGB 11.2* 11.0*  HCT 34.4* 32.8*  PLT 215 247   BMET  Recent Labs  01/24/14 0418 01/25/14 0400  NA 136* 135*  K 3.6* 4.7  CL 100 99  CO2 20 23  GLUCOSE 95 109*  BUN 14 12  CREATININE 0.77 0.75  CALCIUM 8.7 9.4   PT/INR No results found for this basename: LABPROT, INR,  in the last 72 hours CMP     Component Value Date/Time   NA 135* 01/25/2014 0400   K 4.7 01/25/2014 0400   CL 99 01/25/2014 0400   CO2 23 01/25/2014 0400   GLUCOSE 109* 01/25/2014 0400   BUN 12 01/25/2014 0400   CREATININE 0.75 01/25/2014 0400   CALCIUM 9.4 01/25/2014 0400   PROT 8.1 01/14/2014 1402   ALBUMIN 3.9 01/14/2014 1402   AST 23 01/14/2014 1402   ALT 13 01/14/2014 1402   ALKPHOS 56 01/14/2014 1402   BILITOT 0.4 01/14/2014 1402   GFRNONAA 76* 01/25/2014 0400   GFRAA 88* 01/25/2014 0400   Lipase     Component Value Date/Time   LIPASE 32 01/14/2014 1402       Studies/Results: Dg Abd Portable 1v  01/25/2014   CLINICAL DATA:  Abdominal pain and distension.  EXAM: PORTABLE ABDOMEN - 1 VIEW  COMPARISON:  01/16/2014   FINDINGS: There is significant colonic distention developing since the prior exam. This involves the transverse and right colon. Minimal air is seen in the rectum with a small amount of air projecting in the region of the descending colon. A faintly seen tube projects in the left upper quadrant.  No gross free air on this supine study. Soft tissues are not well defined due to the overlying bowel gas.  IMPRESSION: Colonic distention has developed since the prior study consistent with a partial colonic obstruction at the level of the splenic flexure.   Electronically Signed   By: Lajean Manes M.D.   On: 01/25/2014 09:01    Anti-infectives: Anti-infectives   Start     Dose/Rate Route Frequency Ordered Stop   01/20/14 0600  [MAR Hold]  cefoTEtan (CEFOTAN) 2 g in dextrose 5 % 50 mL IVPB     (On MAR Hold since 01/20/14 1244)  Comments:  Pharmacy may adjust dose strength for optimal dosing.   Send with patient on call to the OR.  Anesthesia to complete antibiotic administration <42min prior to incision per [facility].   2 g 100 mL/hr over 30 Minutes Intravenous On call to O.R. 01/19/14 1548 01/20/14 1325  Assessment/Plan  Cancer of the pancreas POD #5 s/p INSERTION PORT-A-CATH RIGHT INTERNAL JUGULAR  Obstruction of left colon POD #5 s/p LAPAROSCOPIC COLOSTOMY    Plan: 1. Patient's films yesterday concerning for colonic obstruction.  The fascia is not so tight that it should be the cause.  We will obtain a CT scan today to evaluate for kink or twisting of the colon that may be the source of her distention and inability to pass gas or stool. 2. NPO x ice  LOS: 12 days    Millianna Szymborski E 01/26/2014, 8:17 AM Pager: [phone]

## 2014-01-26 NOTE — Op Note (Signed)
01/14/2014 - 01/26/2014  5:07 PM  PATIENT:  Karen Arias  80 y.o. female  PRE-OPERATIVE DIAGNOSIS:  Large bowel obstruction at colostomy  POST-OPERATIVE DIAGNOSIS:  same PROCEDURE:  Procedure(s): EXPLORATORY LAPAROTOMY, EXCISION of LOOP COLOSTOMY, MUCUS FISTULA AND END COLOSTOMY (N/A)  SURGEON:  Surgeon(s) and Role:    * Ralene Ok, MD - Primary  PHYSICIAN ASSISTANT:   ASSISTANTS: none   ANESTHESIA:   general  EBL:  Total I/O In: 1000 [I.V.:1000] Out: 1050 [Urine:1050]  BLOOD ADMINISTERED:none  DRAINS: none   LOCAL MEDICATIONS USED:  BUPIVICAINE   SPECIMEN:  Source of Specimen:  Loop colostomy  DISPOSITION OF SPECIMEN:  PATHOLOGY  COUNTS:  YES  TOURNIQUET:  * No tourniquets in log *  DICTATION: .Dragon Dictation After the patient was consented teh patient was taken back to the OR and placedin teh supine position with bilateral SCDs in place.  The patient was prepped and draped in the usual sterile fashion.  After appropriate antibiotics were confirmed a timeout was called and all facts were verified.    A midline incision was made with a 10 blade.  Electrocautery was useded to maintain hemostasis and dissection was taken down tot he fascia.  The peritoneum was entered bluntly, and incised teh legnth of the incision.  There was a dilated Right colon that could be seen.  I retracted the fascia and proceeded to free up the loop colostomy from the surround omental adhesions.  I cut all the surround skin level sutures.  I delivered the ostomy into the wound.  There seemed to be a stricture at teh base of the ostomy which was liekly the point of obstruction resulting from the surrounding edema.  A healthy proximal portion of the colon was stapled using a 75 GIA stapler. The healthy distal portion of the transverse colon was stapled using a 75 Endo GIA stapler. The mesentery was transected using a ligature device.  At this time the Peritoneum and fascial of the colostomy  site was reapproximated using interrupted 0 Ethibonds Individually.  At this time proceeded to create a mucous fistula to the left upper quadrant just superior to colostomy site. The peritoneum was incised after making a Circular skin incision.  I was able to the liver the colon up to the fascia. The fascia was dilated previously with 2 fingerbreadths. At this time to bring up the ovary end colostomy in the right upper quadrant after making the skin incision. Dissection was taken down bluntly into the peritoneum incised. Right colon was brought up through the colostomy site.  At this time I proceeded to reapproximate the midline fascia using interrupted #1 PDS, single-stranded. This was done in a standard running fashion. At this time a proceeded to mature the mucous fistula using 2-0 Vicryl in an interrupted fashion, After removing the staple line.  I proceeded to mature the colostomy through the staple line. This was sewn with 2-0 Vicryl in interrupted fashion. Both the colostomy and mucous fistula seemed healthy and pink at the end of the operation. The midline was irrigated out with sterile saline as was the previous colostomy wound. Betadine soaked gauze was then placed into each incision.   The patient tolerated the procedure well and was taken to PACU in stable condition.  PLAN OF CARE: Admit to inpatient   PATIENT DISPOSITION:  PACU - hemodynamically stable.   Delay start of Pharmacological VTE agent (>24hrs) due to surgical blood loss or risk of bleeding: not applicable

## 2014-01-26 NOTE — Progress Notes (Signed)
PT Cancellation Note  ___Treatment cancelled today due to medical issues with patient which prohibited therapy  _X_ Treatment cancelled today due to patient receiving procedure or test ........Marland Kitchen ABD CT am then going to have more surgery today  ___ Treatment cancelled today due to patient's refusal to participate   ___ Treatment cancelled today due to  Karen Arias  PTA [facility]  Acute  Rehab Pager      [phone]

## 2014-01-26 NOTE — Progress Notes (Signed)
Patient ID: Karen Arias, female   DOB: 07/11/1928, 80 y.o.   MRN: [id number] [facility] PROGRESS NOTE  MYANGEL SUMMONS YQM:[id number] DOB: [birth year] DOA: 01/14/2014 PCP: [name], MD  Brief narrative: 80 y.o. female with a PMH of hypertension, hiatal hernia (EGD in 09/2012) who followed with Dr. [name] for GI related issues who was admitted 01/14/14 with chief complaint of abdominal pain and constipation. She was found to have LUQ soft tissue mass (5.7 cm in diameter) on CT abdomen causing obstruction of the colon at the level of the splenic flexure, worrisome for malignancy. Her tumor marker CA 19-9 elevated, concerning for pancreatic cancer. Colonoscopy prep attempted but patient could not tolerate well. Underwent a flexible sigmoidoscopy 01/18/14 which showed stenosis of the colon at the splenic flexure with extrinsic compression. Underwent diverting colostomy 01/20/14.  Assessment/Plan:   Principal Problem:  Left upper quadrant mass causing bowel obstruction, probable pancreatic cancer status post diverting colostomy  Left upper quadrant mass worrisome for pancreatic cancer. CEA WNL. CA 19-9 3434. CA-125 is 64.  Status post flexible sigmoidoscopy 01/18/14 showing stenosis of the colon at the splenic flexure with extrinsic compression (unable to tolerate prep for full colonoscopy).  Status post diverting colostomy 01/20/14.  Ostomy nurse following for education of ostomy care. Dr. [name] plans palliative chemotherapy once patient stable.  Patient with more abdominal distention and pain. CT abdomen 01/26/2014 showed colonic obstruction with distention of the right colon due ot distal transverse colonic herniation at the colostomy site. Will see with surgery if they plan to do surgery again. Active Problems:  Hypokalemia  Replaced. Postoperative urinary retention  Resolved, Foley successfully discontinued 01/22/14 after a voiding trial.  Acute kidney injury  Creatinine  elevation noted 01/19/14. Possibly secondary to contrast nephropathy. BP medications including lisinopril/Maxide were held and the patient has been gently hydrated. KVO IVF.  Creatinine back to baseline by 01/23/14 so Lisinopril and Maxzide restarted. Essential hypertension  Resumed home medications: Maxzide and Lisinopril. Continue hydralazine 5 mg IV every 6 hours PRN.  Diabetes mellitus  Currently on insulin sensitive SSI.  DVT Prophylaxis  Continue Lovenox.    Code Status: Full.  Family Communication: Granddaughter updated at the bedside.  Disposition Plan: Home when stable.   IV Access:   Peripheral IV  Port-A-Cath placed 01/20/14 Procedures and diagnostic studies:   Ct Abdomen Pelvis W Contrast 01/14/2014 Left upper quadrant soft tissue mass causing obstruction of the colon at the level of the splenic flexure. The mass measures approximately 5.7 cm in greatest dimensions. Exact origin of the tumor is not entirely clear by CT. 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 Second most likely origin would be the colon. No associated colonic perforation is identified by CT. No associated enlarged lymph nodes or distant metastatic lesions are identified in the abdomen or pelvis.  Dg Abd Acute W/chest 01/14/2014 Negative abdominal radiographs. No acute cardiopulmonary disease.  Dg Abd 2 Views 01/15/2014 : No evidence of small bowel obstruction or free air.  Dg Abd 01/16/2014: No evidence of bowel obstruction.  Flexible sigmoidoscopy 01/18/14: The colonoscope was advanced to approximately the region of the splenic flexure. There was stenosis of the colon with hyperemic mucosa suggesting extrinsic compression. The scope would not pass  beyond. No obvious primary mucosal lesion. Left-sided diverticulosis present.  Laparoscopic colostomy and insertion of Port-A-Cath right internal jugular done by Dr. Excell Seltzer 10/08/39: No complications noted.  Dg Abd Portable 1v  01/25/2014: Colonic distention has  developed since the prior study consistent with a partial colonic obstruction at the level of the splenic flexure.  Medical Consultants:   Dr. Heath Lark, Oncology.  Dr. Scarlette Shorts, GI.  Dr. Excell Seltzer, Surgery. Other Consultants:   Physical therapy: Home health PT with 24-hour supervision/assistance. Walker with 5 inch wheels. Anti-Infectives:   None.   [name], MD  [facility] Pager [phone]  If 7PM-7AM, please contact night-coverage [email or web address] Password [facility] 01/26/2014, 5:46 PM   LOS: 12 days    HPI/Subjective: No acute overnight events.  Objective: Filed Vitals:   01/26/14 1725 01/26/14 1730 01/26/14 1735 01/26/14 1740  BP:  184/107 163/87 163/93  Pulse: 105 72 110 105  Temp: 98.6 F (37 C)     TempSrc:      Resp: 21 23 21 15   Height:      Weight:      SpO2: 91% 95% 99% 96%    Intake/Output Summary (Last 24 hours) at 01/26/14 1746 Last data filed at 01/26/14 1725  Gross per 24 hour  Intake   1400 ml  Output   1420 ml  Net    -20 ml    Exam:   General:  Pt is alert, follows commands appropriately, not in acute distress  Cardiovascular: Regular rate and rhythm, S1/S2, soft ejection murmur appreciated   Respiratory: Clear to auscultation bilaterally, no wheezing, no crackles, no rhonchi  Abdomen: distended, colostomy in place; appreciate bowel sounds  Extremities: +1 LE pitting edema, pulses DP and PT palpable bilaterally  Neuro: Grossly nonfocal  Data Reviewed: Basic Metabolic Panel:  Recent Labs Lab 01/21/14 0400 01/23/14 0418 01/24/14 0418 01/25/14 0400 01/26/14 1446  NA 133* 137 136* 135* 131*  K 4.4 3.4* 3.6* 4.7 4.7  CL 99 99 100 99 93*  CO2 20 23 20 23 22   GLUCOSE 186* 101* 95 109* 104*  BUN 29* 13 14 12 10   CREATININE 1.12* 0.83 0.77 0.75 0.88  CALCIUM 8.3* 8.3* 8.7 9.4 10.9*   Liver Function Tests: No results found for this basename: AST, ALT, ALKPHOS, BILITOT, PROT,  ALBUMIN,  in the last 168 hours No results found for this basename: LIPASE, AMYLASE,  in the last 168 hours No results found for this basename: AMMONIA,  in the last 168 hours CBC:  Recent Labs Lab 01/21/14 0400 01/23/14 0418 01/24/14 0418 01/25/14 0400 01/26/14 1446  WBC 4.0 4.5 5.5 6.4 9.5  HGB 10.7* 10.4* 11.2* 11.0* 13.4  HCT 32.5* 30.6* 34.4* 32.8* 40.6  MCV 89.8 88.7 90.3 88.4 90.2  PLT 223 202 215 247 284   Cardiac Enzymes: No results found for this basename: CKTOTAL, CKMB, CKMBINDEX, TROPONINI,  in the last 168 hours BNP: No components found with this basename: POCBNP,  CBG:  Recent Labs Lab 01/25/14 1617 01/25/14 2112 01/26/14 0736 01/26/14 1227 01/26/14 1427  GLUCAP 112* 106* 108* 114* 100*    Recent Results (from the past 240 hour(s))  SURGICAL PCR SCREEN     Status: None   Collection Time    01/19/14  8:10 PM      Result Value Ref Range Status   MRSA, PCR NEGATIVE  NEGATIVE Final   Staphylococcus aureus NEGATIVE  NEGATIVE Final   Comment:            The Xpert SA Assay (FDA     approved for NASAL specimens     in patients over 63 years of age),     is one component of  a comprehensive surveillance     program.  Test performance has     been validated by [facility] for patients greater     than or equal to 6 year old.     It is not intended     to diagnose infection nor to     guide or monitor treatment.     Scheduled Meds: . [MAR HOLD] ALPRAZolam  1 mg Oral QHS  . [facility] HOLD] atorvastatin  80 mg Oral QHS  . [MAR HOLD] enoxaparin (LOVENOX) injection  40 mg Subcutaneous Q24H  . HYDROmorphone      . [MAR HOLD] insulin aspart  0-9 Units Subcutaneous TID WC  . [MAR HOLD] lisinopril  2.5 mg Oral q morning - 10a  . [MAR HOLD] pantoprazole  40 mg Oral Daily  . promethazine      . [facility] HOLD] triamterene-hydrochlorothiazide  1 tablet Oral q morning - 10a   Continuous Infusions: . 0.9 % NaCl with KCl 20 mEq / L 20 mL/hr at 01/26/14 0935

## 2014-01-26 NOTE — Transfer of Care (Signed)
Immediate Anesthesia Transfer of Care Note  Patient: Karen Arias  Procedure(s) Performed: Procedure(s) (LRB): EXPLORATORY LAPAROTOMY, COLOSTOMY, EXCISION OF MUCUS FISTULA AND END COLOSTOMY (N/A)  Patient Location: PACU  Anesthesia Type: General  Level of Consciousness: sedated, patient cooperative and responds to stimulation  Airway & Oxygen Therapy: Patient Spontanous Breathing and Patient connected to face mask oxgen  Post-op Assessment: Report given to PACU RN and Post -op Vital signs reviewed and stable  Post vital signs: Reviewed and stable  Complications: No apparent anesthesia complications

## 2014-01-26 NOTE — Anesthesia Preprocedure Evaluation (Addendum)
Anesthesia Evaluation  Patient identified by MRN, date of birth, ID band Patient awake    Reviewed: Allergy & Precautions, H&P , NPO status , Patient's Chart, lab work & pertinent test results  Airway Mallampati: II TM Distance: >3 FB Neck ROM: Full    Dental no notable dental hx.    Pulmonary neg pulmonary ROS,  breath sounds clear to auscultation  Pulmonary exam normal       Cardiovascular hypertension, Rhythm:Regular Rate:Normal     Neuro/Psych negative neurological ROS  negative psych ROS   GI/Hepatic negative GI ROS, Neg liver ROS,   Endo/Other  diabetes, Oral Hypoglycemic Agents  Renal/GU negative Renal ROS  negative genitourinary   Musculoskeletal negative musculoskeletal ROS (+)   Abdominal   Peds negative pediatric ROS (+)  Hematology negative hematology ROS (+)   Anesthesia Other Findings   Reproductive/Obstetrics negative OB ROS                          Anesthesia Physical Anesthesia Plan  ASA: III  Anesthesia Plan: General   Post-op Pain Management:    Induction: Intravenous, Rapid sequence and Cricoid pressure planned  Airway Management Planned: Oral ETT  Additional Equipment:   Intra-op Plan:   Post-operative Plan: Extubation in OR  Informed Consent: I have reviewed the patients History and Physical, chart, labs and discussed the procedure including the risks, benefits and alternatives for the proposed anesthesia with the patient or authorized representative who has indicated his/her understanding and acceptance.   Dental advisory given  Plan Discussed with: CRNA and Surgeon  Anesthesia Plan Comments:         Anesthesia Quick Evaluation

## 2014-01-26 NOTE — Progress Notes (Signed)
Pt with post op colonic dilatation.  Now CT shows: colonic obstruction with distension of the right colon and transverse colon due to incarcerated distal transverse colonic herniation at the colostomy site in left lower abdomen anteriorly. Best seen in coronal image 14.  The previous mass in left upper abdomen has been removed. There are postsurgical changes in left upper abdomen and small amount of perisplenic ascites. I have discussed this with Dr. Rosendo Gros and he plans to take patient back to the OR this afternoon for colostomy revision.  I have talked to [facility] and her daughter, along with other family members.  Risk including bleeding, infection, and possible perforation were discussed.  She is NPO and the nursing staff are working on pre op checklist.  I have ordered some labs stat preop.     Pancreatic cancer with LUQ mass s/p diverting colostomy and insertion or porta cath 01/20/14, Dr. Excell Seltzer Acute renal injury Hypertension Diabetes Hx of dyslipidemia Hx of MI Hx of gastritis/h pylori

## 2014-01-26 NOTE — Progress Notes (Signed)
I have seen and examined the pt and agree with PA-Jenning's progress note. Secondary to the colonic obstruction At the ostomy. We will proceed with ostomy revision.  I did discuss with the patient the risks and benefits of the procedure to include but not limited to infection, bleeding, damage to surrounding structures, possible need for further surgery. The patient voiced understanding and wishes to proceed.

## 2014-01-27 ENCOUNTER — Encounter (HOSPITAL_COMMUNITY): Payer: Self-pay | Admitting: General Surgery

## 2014-01-27 DIAGNOSIS — K632 Fistula of intestine: Secondary | ICD-10-CM

## 2014-01-27 LAB — GLUCOSE, CAPILLARY
GLUCOSE-CAPILLARY: 97 mg/dL (ref 70–99)
GLUCOSE-CAPILLARY: 219 mg/dL — AB (ref 70–99)
Glucose-Capillary: 110 mg/dL — ABNORMAL HIGH (ref 70–99)

## 2014-01-27 MED ORDER — INSULIN ASPART 100 UNIT/ML ~~LOC~~ SOLN
3.0000 [IU] | Freq: Once | SUBCUTANEOUS | Status: AC
  Administered 2014-01-27: 3 [IU] via SUBCUTANEOUS

## 2014-01-27 NOTE — Progress Notes (Signed)
1 Day Post-Op  Subjective: Pt doing well.  Some abd soreness.  Pt with some stool in ostomy, min output in mucus fistula.  Objective: Vital signs in last 24 hours: Temp:  [97.8 F (36.6 C)-98.6 F (37 C)] 97.9 F (36.6 C) (09/17 0600) Pulse Rate:  [54-111] 54 (09/17 0600) Resp:  [12-23] 16 (09/17 0600) BP: (125-184)/(38-107) 128/38 mmHg (09/17 0605) SpO2:  [91 %-100 %] 100 % (09/17 0600) Weight:  [129 lb (58.514 kg)] 129 lb (58.514 kg) (09/17 4008) Last BM Date: 01/27/14  Intake/Output from previous day: 09/16 0701 - 09/17 0700 In: 1400 [I.V.:1400] Out: 1575 [Urine:1050; Stool:475; Blood:50] Intake/Output this shift:    General appearance: alert and cooperative Resp: clear to auscultation bilaterally Cardio: regular rate and rhythm, S1, S2 normal, no murmur, click, rub or gallop GI: soft, approp ttp, nd, ostomy & fistula pink/patent  Lab Results:   Recent Labs  01/25/14 0400 01/26/14 1446  WBC 6.4 9.5  HGB 11.0* 13.4  HCT 32.8* 40.6  PLT 247 284   BMET  Recent Labs  01/25/14 0400 01/26/14 1446  NA 135* 131*  K 4.7 4.7  CL 99 93*  CO2 23 22  GLUCOSE 109* 104*  BUN 12 10  CREATININE 0.75 0.88  CALCIUM 9.4 10.9*   PT/INR  Recent Labs  01/26/14 1446  LABPROT 13.7  INR 1.05   ABG No results found for this basename: PHART, PCO2, PO2, HCO3,  in the last 72 hours  Studies/Results: Ct Abdomen Pelvis W Contrast  01/26/2014   CLINICAL DATA:  Colonic obstructive, evaluate for blockage of colostomy.  EXAM: CT ABDOMEN AND PELVIS WITH CONTRAST  TECHNIQUE: Multidetector CT imaging of the abdomen and pelvis was performed using the standard protocol following bolus administration of intravenous contrast.  CONTRAST:  129mL OMNIPAQUE IOHEXOL 300 MG/ML  SOLN  COMPARISON:  01/14/2014  FINDINGS: Lung bases are unremarkable. Atherosclerotic calcifications of coronary arteries.  There is about 6 mm anterolisthesis L4 on L5 vertebral body.  Enhanced liver is unremarkable. No  calcified gallstones are noted within gallbladder. Enhanced kidneys are symmetrical in size. No hydronephrosis or hydroureter. There is mild distension of the right colon and transverse colon with air-fluid level. Atherosclerotic calcifications of abdominal aorta and iliac arteries. No aortic aneurysm. The previous mass in left upper abdomen has been removed. Postsurgical changes are noted in the region of pancreatic tail and splenic hilum. Small amount of perisplenic ascites.  Delayed renal images shows bilateral renal symmetrical excretion.  There is a colostomy in left abdomen. There is herniation of distal transverse colon at the colostomy site with a bulging exophytic soft tissue appearance at the level of abdominal wall. Findings are consistent with acute colonic obstruction due to incarcerated colonic herniation at colostomy site.  Distended urinary bladder. There is a retroflexed uterus. Small amount of free fluid is noted within posterior pelvis.  IMPRESSION: 1. There is colonic obstruction with distension of the right colon and transverse colon due to incarcerated distal transverse colonic herniation at the colostomy site in left lower abdomen anteriorly. Best seen in coronal image 14. 2. The previous mass in left upper abdomen has been removed. There are postsurgical changes in left upper abdomen and small amount of perisplenic ascites. 3. Degenerative changes lumbar spine. 4. No hydronephrosis or hydroureter. 5. Distended urinary bladder. Retroflexed uterus. Small amount of pelvic free fluid within posterior pelvis. Critical Value/emergent results were called by telephone at the time of interpretation on 01/26/2014 at 1:31 pm to Dr. Claiborne Billings  OSBORNE , who verbally acknowledged these results.   Electronically Signed   By: Lahoma Crocker M.D.   On: 01/26/2014 13:31   Dg Abd Portable 1v  01/25/2014   CLINICAL DATA:  Abdominal pain and distension.  EXAM: PORTABLE ABDOMEN - 1 VIEW  COMPARISON:  01/16/2014   FINDINGS: There is significant colonic distention developing since the prior exam. This involves the transverse and right colon. Minimal air is seen in the rectum with a small amount of air projecting in the region of the descending colon. A faintly seen tube projects in the left upper quadrant.  No gross free air on this supine study. Soft tissues are not well defined due to the overlying bowel gas.  IMPRESSION: Colonic distention has developed since the prior study consistent with a partial colonic obstruction at the level of the splenic flexure.   Electronically Signed   By: Lajean Manes M.D.   On: 01/25/2014 09:01    Anti-infectives: Anti-infectives   Start     Dose/Rate Route Frequency Ordered Stop   01/27/14 0600  [MAR Hold]  cefoTEtan (CEFOTAN) 2 g in dextrose 5 % 50 mL IVPB     (On MAR Hold since 01/26/14 1454)   2 g 100 mL/hr over 30 Minutes Intravenous On call to O.R. 01/26/14 1425 01/26/14 1530   01/20/14 0600  [MAR Hold]  cefoTEtan (CEFOTAN) 2 g in dextrose 5 % 50 mL IVPB     (On MAR Hold since 01/20/14 1244)  Comments:  Pharmacy may adjust dose strength for optimal dosing.   Send with patient on call to the OR.  Anesthesia to complete antibiotic administration <40min prior to incision per [facility]   2 g 100 mL/hr over 30 Minutes Intravenous On call to O.R. 01/19/14 1548 01/20/14 1325      Assessment/Plan: s/p Procedure(s): EXPLORATORY LAPAROTOMY, COLOSTOMY, EXCISION OF MUCUS FISTULA AND END COLOSTOMY (N/A) Clear diet Ambulate Ostomy to eval appliances BID dressing changes   LOS: 13 days    Rosario Jacks., Anne Hahn 01/27/2014

## 2014-01-27 NOTE — Progress Notes (Signed)
Pt c/o of Morphine PCA. States she doesn't understand how it works and has to be reminded which button to push. MD notified. Orders to dc PCA and give breakthrough PRN. Will continue to monitor.

## 2014-01-27 NOTE — Progress Notes (Signed)
Pm CBG 219, on-call notified. New orders given.

## 2014-01-27 NOTE — Consult Note (Addendum)
WOC ostomy consult note Stoma type/location: Revision of LUQ Colostomy to RUQ Colostomy and LUQ mucus fistula. Revision yesterday, 01/26/14 PM. Stomal assessment/size: LUQ MF is 1 3/8" well  Budded, pink and moist.  Minimal output in pouch. RUQ Colostomy 1 3/4" round, pink and well budded.  Stool noted in pouch.   Peristomal assessment: Pouch was just applied yesterday afternoon after surgery and is intact.  Will not remove today.  Pouch emptying and care reviewed with patient's daughter, who is at the bedside.  Treatment options for stomal/peristomal skin: None needed at this time.  Midline abdominal surgical incision. Dressing intact.  Output Liquid brown stool.  Ostomy pouching: 2pc. 2 3/4" pouching system for both.  Reviewed stoma measuring with daughter and marked the measuring guide with the current stomal size.  Daughter would like to be present for next pouch change and will be here tomorrow AM.   Education provided:  Reviewed stoma measuring, pouch emptying and cleaning pouch and peristomal skin reviewed.  2 3/4" pouches present in patient room, as well as barrier rings if needed.  Patient would benefit from [facility] after discharge. Daughter indicated that patient did not qualify for rehab at this time. [facility] team will continue to follow and be available to patient, medical and nursing teams.  Domenic Moras RN BSN [place] Pager [phone]

## 2014-01-27 NOTE — Progress Notes (Signed)
Patient ID: Karen Arias, female   DOB: 1929/01/24, 80 y.o.   MRN: [id number] [facility] PROGRESS NOTE  LILIANE MALLIS VEL:[id number] DOB: [birth year] DOA: 01/14/2014 PCP: [name], MD  Brief narrative: 80 y.o. female with a PMH of hypertension, hiatal hernia (EGD in 09/2012) who followed with Dr. [name] for GI related issues who was admitted 01/14/14 with chief complaint of abdominal pain and constipation. She was found to have LUQ soft tissue mass (5.7 cm in diameter) on CT abdomen causing obstruction of the colon at the level of the splenic flexure, worrisome for malignancy. Her tumor marker CA 19-9 elevated, concerning for pancreatic cancer. Colonoscopy prep attempted but patient could not tolerate well. Underwent a flexible sigmoidoscopy 01/18/14 which showed stenosis of the colon at the splenic flexure with extrinsic compression. Underwent diverting colostomy 01/20/14. 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 She subsequently underwent exploratory laparotomy, colostomy, excision of colonic fistula and end colostomy 01/26/2014.  Assessment/Plan:   Principal Problem:  Left upper quadrant mass causing bowel obstruction, probable pancreatic cancer status post diverting colostomy  On admission, pt was found to have left upper quadrant mass worrisome for pancreatic cancer. CEA WNL. CA 19-9 3434. CA-125 is 64.  Patient is status post flexible sigmoidoscopy 01/18/14 showing stenosis of the colon at the splenic flexure with extrinsic compression (unable to tolerate prep for full colonoscopy).  Status post diverting colostomy 01/20/14. Ostomy nurse following for education of ostomy care.  Dr. [name] plans palliative chemotherapy once patient stable.  Patient with more abdominal distention and pain. CT abdomen 01/26/2014 showed colonic obstruction with distention of the right colon due ot distal transverse colonic herniation at the colostomy site. She  subsequently underwent exploratory laparotomy, colostomy, excision of colonic fistula and end colostomy 01/26/2014. Active Problems:  Hypokalemia  Replaced. Postoperative urinary retention  Resolved, Foley successfully discontinued 01/22/14 after a voiding trial.  Acute renal failure Creatinine elevation noted 01/19/14. Possibly secondary to contrast nephropathy. BP medications including lisinopril/Maxide were held and the patient has been gently hydrated. KVO IVF.  Creatinine back to baseline by 01/23/14 so Lisinopril and Maxzide restarted. Essential hypertension  Resumed home medications: Maxzide and Lisinopril. Continue hydralazine 5 mg IV every 6 hours PRN.  Diabetes mellitus  Currently on insulin sensitive SSI.  CBG's in past 24 hours: 119, 120, 97 DVT Prophylaxis  Continue Lovenox.    Code Status: Full.  Family Communication: Granddaughter updated at the bedside.  Disposition Plan: Home when stable.    IV Access:   Peripheral IV  Port-A-Cath placed 01/20/14 Procedures and diagnostic studies:    Ct Abdomen Pelvis W Contrast 01/14/2014 Left upper quadrant soft tissue mass causing obstruction of the colon at the level of the splenic flexure. The mass measures approximately 5.7 cm in greatest dimensions. Exact origin of the tumor is not entirely clear by CT. 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 Second most likely origin would be the colon. No associated colonic perforation is identified by CT. No associated enlarged lymph nodes or distant metastatic lesions are identified in the abdomen or pelvis.   Dg Abd Acute W/chest 01/14/2014 Negative abdominal radiographs. No acute cardiopulmonary disease.   Dg Abd 2 Views 01/15/2014 : No evidence of small bowel obstruction or free air.   Dg Abd 01/16/2014: No evidence of bowel obstruction.   Flexible sigmoidoscopy 01/18/14: The colonoscope was advanced to approximately the region of the splenic flexure.  There was  stenosis of the colon with hyperemic mucosa suggesting extrinsic compression. The scope would not pass beyond. No obvious primary mucosal lesion. Left-sided diverticulosis present.   Laparoscopic colostomy and insertion of Port-A-Cath right internal jugular done by Dr. Excell Seltzer 1/77/93: No complications noted.   Dg Abd Portable 1v 01/25/2014: Colonic distention has developed since the prior study consistent with a partial colonic obstruction at the level of the splenic flexure.   Ct Abdomen Pelvis W Contrast 01/26/2014    1. There is colonic obstruction with distension of the right colon and transverse colon due to incarcerated distal transverse colonic herniation at the colostomy site in left lower abdomen anteriorly. Best seen in coronal image 14. 2. The previous mass in left upper abdomen has been removed. There are postsurgical changes in left upper abdomen and small amount of perisplenic ascites. 3. Degenerative changes lumbar spine. 4. No hydronephrosis or hydroureter. 5. Distended urinary bladder. Retroflexed uterus. Small amount of pelvic free fluid within posterior pelvis.   Exploratory Laparotomy, colostomy, excision of colonic fistula and end colostomy 01/26/2014  Medical Consultants:   Dr. Heath Lark, Oncology.  Dr. Scarlette Shorts, GI.  Dr. Excell Seltzer, Surgery. Other Consultants:   Physical therapy: Home health PT with 24-hour supervision/assistance. Walker with 5 inch wheels. Anti-Infectives:   None.  [name], MD  [facility] Pager [phone]  If 7PM-7AM, please contact night-coverage [email or web address] Password [email or web address] 01/27/2014, 2:48 PM   LOS: 13 days    HPI/Subjective: No acute overnight events.  Objective: Filed Vitals:   01/27/14 0605 01/27/14 0638 01/27/14 0800 01/27/14 1148  BP: 128/38     Pulse:      Temp:      TempSrc:      Resp:   19 20  Height:      Weight:  58.514 kg (129 lb)    SpO2:   99%     Intake/Output Summary (Last 24 hours)  at 01/27/14 1448 Last data filed at 01/27/14 1200  Gross per 24 hour  Intake   1400 ml  Output   1325 ml  Net     75 ml    Exam:   General:  Pt is alert, follows commands appropriately, not in acute distress  Cardiovascular: tachycardic, S1/S2 appreciated, systolic murmur appreciated   Respiratory: bilateral air entry, no wheezing   Abdomen: 2 ostomies present; bowel sounds appreciated, non tender abdomen   Extremities: no pitting edema, pulses DP and PT palpable bilaterally  Neuro: non-focal   Data Reviewed: Basic Metabolic Panel:  Recent Labs Lab 01/21/14 0400 01/23/14 0418 01/24/14 0418 01/25/14 0400 01/26/14 1446  NA 133* 137 136* 135* 131*  K 4.4 3.4* 3.6* 4.7 4.7  CL 99 99 100 99 93*  CO2 20 23 20 23 22   GLUCOSE 186* 101* 95 109* 104*  BUN 29* 13 14 12 10   CREATININE 1.12* 0.83 0.77 0.75 0.88  CALCIUM 8.3* 8.3* 8.7 9.4 10.9*   Liver Function Tests: No results found for this basename: AST, ALT, ALKPHOS, BILITOT, PROT, ALBUMIN,  in the last 168 hours No results found for this basename: LIPASE, AMYLASE,  in the last 168 hours No results found for this basename: AMMONIA,  in the last 168 hours CBC:  Recent Labs Lab 01/21/14 0400 01/23/14 0418 01/24/14 0418 01/25/14 0400 01/26/14 1446  WBC 4.0 4.5 5.5 6.4 9.5  HGB 10.7* 10.4* 11.2* 11.0* 13.4  HCT 32.5* 30.6* 34.4* 32.8* 40.6  MCV 89.8 88.7 90.3 88.4 90.2  PLT 223 202 215 247  284   Cardiac Enzymes: No results found for this basename: CKTOTAL, CKMB, CKMBINDEX, TROPONINI,  in the last 168 hours BNP: No components found with this basename: POCBNP,  CBG:  Recent Labs Lab 01/26/14 1227 01/26/14 1427 01/26/14 1805 01/26/14 2254 01/27/14 1122  GLUCAP 114* 100* 119* 120* 97    SURGICAL PCR SCREEN     Status: None   Collection Time    01/19/14  8:10 PM      Result Value Ref Range Status   MRSA, PCR NEGATIVE  NEGATIVE Final   Staphylococcus aureus NEGATIVE  NEGATIVE Final     Scheduled  Meds: . ALPRAZolam  1 mg Oral QHS  . atorvastatin  80 mg Oral QHS  . enoxaparin (LOVENOX)   40 mg Subcutaneous Q24H  . insulin aspart  0-9 Units Subcutaneous TID WC  . lisinopril  2.5 mg Oral q morning - 10a  . morphine   Intravenous 6 times per day  . pantoprazole  40 mg Oral Daily  . triamterene-hydrochlorothiazide  1 tablet Oral q morning - 10a   Continuous Infusions: . 0.9 % NaCl with KCl 20 mEq / L 75 mL/hr at 01/27/14 1150

## 2014-01-27 NOTE — Progress Notes (Signed)
NUTRITION FOLLOW UP  Intervention:   -Encouraged PO intake as tolerated; assisted with meal ordering -Diet advancement per MD -Will follow up with colostomy nutrition education needs  Nutrition Dx:   Inadequate oral intake related to altered gi fucntion as evidenced by clear liquid diet.; ongoing  Goal:   Tolerate diet advancement with intake to meet >90% estimated needs; progressing   Monitor:   Diet order, total protein/energy intake, labs, weights, GI profile, diet education needs  Assessment:   9/06: Patient tolerating clear liquid diet well  Ate well until 1 week ago.  UBW 130 lbs with weight loss in the last 4 months  9/11: -Pt s/p flex sigmoidoscopy on 9/08, and s/p colostomy on 9/10 -Tolerating clear liquid diet -Dislikes [employer] as it too sweet, will d/c. Encouraged intake as tolerated -Diet advancement per MD; noted they will advance once bowel function returns. Last BM documented on 9/10; however pt without flatus -No questions yet regarding colostomy; however requested nutrition follow up at later date to review education  9/17: -Diet advanced to Full Liquid on 9/14 with 10-50% PO intake -MD noted pt with colonic obstruction with distension of right colon per CT results of abd  -Pt NPO for exploratory laparotomy, excision of loop colostomy, mucus fistula and end colostomy on 9/16 -Advanced to Clear liquid on 9/17 -Pt had not yet ordered meal during time of RD follow up.  -Assisted pt in ordering lunch, encouraged intake as tolerated -Pt not in condition for colostomy diet education; will monitor diet advancement tolerance and education needs -400 ml stool output in ostomy;  Surgery also encouraged ambulation  Height: Ht Readings from Last 1 Encounters:  01/14/14 5\' 2"  (1.575 m)    Weight Status:   Wt Readings from Last 1 Encounters:  01/27/14 129 lb (58.514 kg)    Re-estimated needs:  Kcal: 1600-1700  Protein: 70-80 gm  Fluid: >1.6L   Skin:  surgical incision on abd  Diet Order: Clear Liquid   Intake/Output Summary (Last 24 hours) at 01/27/14 1247 Last data filed at 01/27/14 1200  Gross per 24 hour  Intake   1400 ml  Output   1725 ml  Net   -325 ml    Last BM: 9/10   Labs:   Recent Labs Lab 01/24/14 0418 01/25/14 0400 01/26/14 1446  NA 136* 135* 131*  K 3.6* 4.7 4.7  CL 100 99 93*  CO2 20 23 22   BUN 14 12 10   CREATININE 0.77 0.75 0.88  CALCIUM 8.7 9.4 10.9*  GLUCOSE 95 109* 104*    CBG (last 3)   Recent Labs  01/26/14 1805 01/26/14 2254 01/27/14 1122  GLUCAP 119* 120* 97    Scheduled Meds: . ALPRAZolam  1 mg Oral QHS  . atorvastatin  80 mg Oral QHS  . enoxaparin (LOVENOX) injection  40 mg Subcutaneous Q24H  . insulin aspart  0-9 Units Subcutaneous TID WC  . lisinopril  2.5 mg Oral q morning - 10a  . morphine   Intravenous 6 times per day  . pantoprazole  40 mg Oral Daily  . triamterene-hydrochlorothiazide  1 tablet Oral q morning - 10a    Continuous Infusions: . 0.9 % NaCl with KCl 20 mEq / L 75 mL/hr at 01/27/14 [place] Clinical Dietitian [phone]

## 2014-01-28 LAB — GLUCOSE, CAPILLARY
GLUCOSE-CAPILLARY: 107 mg/dL — AB (ref 70–99)
GLUCOSE-CAPILLARY: 110 mg/dL — AB (ref 70–99)
Glucose-Capillary: 158 mg/dL — ABNORMAL HIGH (ref 70–99)
Glucose-Capillary: 152 mg/dL — ABNORMAL HIGH (ref 70–99)

## 2014-01-28 NOTE — Consult Note (Signed)
WOC ostomy consult note Stoma type/location: LUQ Mucus fistula/ RUQ Colostomy  Education session with daughter.  Stomal assessment/size:  RUQ Colostomy 1 3/4" round, pink and moist.  Well budded LUQ MF 1 3/8" round, pink and moist.  Peristomal assessment: Intact on both sides.   Treatment options for stomal/peristomal skin: Barrier ring used on LUQ MF site due to proximity to old stoma site and pouching.  None for RUQ Colostomy Output RUQ liquid brown stool.  Pouch is 1/3 full today.  LUQ MF, small amount brown stool in pouch.  Ostomy pouching: 2pc. 2 3/4" system used for each side.  However, going to order smaller pouch for LUQ MF to allow for better fit in respect to old stoma site. Will order Kellie Simmering # [phone]) and Kellie Simmering # [phone]), 2 1/4" system.  2 additional 2 3/4" pouches in room and barrier rings.    Education provided:  Daughter at bedside and was hands on participant in bilateral pouch change today.  Reviewed peristomal care and cleansing, measuring stoma, cutting barrier to fit and pouch application.  Encouraged to empty pouch when 1/3 full to increase wear time.  Discussed that pouches will have a filter after discharge, as patient is having a lot of intestinal gas at this time.  Daughter felt more comfortable after session today and is still unsure if patient will discharge to home or rehab center.  [facility] team will continue to follow.  Domenic Moras RN BSN [place] Pager [phone]

## 2014-01-28 NOTE — Progress Notes (Signed)
Patient ID: Karen Arias, female   DOB: 08-Feb-1929, 80 y.o.   MRN: [id number] [facility] PROGRESS NOTE  Karen Arias YBO:[id number] DOB: Dec 30, 1928 DOA: 01/14/2014 PCP: [name], MD  Brief narrative: 80 y.o. female with a PMH of hypertension, hiatal hernia (EGD in 09/2012) who followed with Dr. [name] for GI related issues who was admitted 01/14/14 with chief complaint of abdominal pain and constipation. She was found to have LUQ soft tissue mass (5.7 cm in diameter) on CT abdomen causing obstruction of the colon at the level of the splenic flexure, worrisome for malignancy. Her tumor marker CA 19-9 elevated, concerning for pancreatic cancer. Colonoscopy prep attempted but patient could not tolerate well. Underwent a flexible sigmoidoscopy 01/18/14 which showed stenosis of the colon at the splenic flexure with extrinsic compression. Underwent diverting colostomy 01/20/14. 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 She subsequently underwent exploratory laparotomy, colostomy, excision of colonic fistula and end colostomy 01/26/2014.   Assessment/Plan:   Principal Problem:  Left upper quadrant mass causing bowel obstruction, probable pancreatic cancer status post diverting colostomy  On admission, pt was found to have left upper quadrant mass worrisome for pancreatic cancer. CEA WNL. CA 19-9 3434. CA-125 is 64.  Patient is status post flexible sigmoidoscopy 01/18/14 showing stenosis of the colon at the splenic flexure with extrinsic compression (unable to tolerate prep for full colonoscopy).  Status post diverting colostomy 01/20/14. Ostomy nurse following for education of ostomy care. Patient with more abdominal distention and pain 01/26/2014. CT abdomen 01/26/2014 showed colonic obstruction with distention of the right colon due ot distal transverse colonic herniation at the colostomy site. She subsequently underwent exploratory laparotomy, colostomy,  excision of colonic fistula and end colostomy 01/26/2014. Dr. [name] plans palliative chemotherapy once patient stable.  In regards to pain management patient was on Dilaudid PCA but had hard time understanding how to use it so we switched to Dilaudid 2 mg IV every 1 hour as needed. Active Problems:  Hypokalemia  Likely secondary to GI losses. Replaced. Obtain blood work tomorrow morning. Postoperative urinary retention  Resolved, Foley successfully discontinued 01/22/14 after a voiding trial.  Acute renal failure  Creatinine elevation noted 01/19/14. Possibly secondary to contrast nephropathy. BP medications including lisinopril/Maxide were held and the patient has been gently hydrated. KVO IVF.  Creatinine back to baseline by 01/23/14 so Lisinopril and Maxzide restarted. Essential hypertension  Resumed home medications: Maxzide and Lisinopril. Continue hydralazine 5 mg IV every 6 hours PRN.  Diabetes mellitus  Currently on insulin sensitive SSI.  DVT Prophylaxis  Continue Lovenox.    Code Status: Full.  Family Communication: Granddaughter updated at the bedside.  Disposition Plan: Home when stable. Physical therapy evaluation may need to be repeated, patient feels weak after the second surgery.   IV Access:   Peripheral IV  Port-A-Cath placed 01/20/14 Procedures and diagnostic studies:   Ct Abdomen Pelvis W Contrast 01/14/2014 Left upper quadrant soft tissue mass causing obstruction of the colon at the level of the splenic flexure. The mass measures approximately 5.7 cm in greatest dimensions. Exact origin of the tumor is not entirely clear by CT. 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 Second most likely origin would be the colon. No associated colonic perforation is identified by CT. No associated enlarged lymph nodes or distant metastatic lesions are identified in the abdomen or pelvis.  Dg Abd Acute W/chest 01/14/2014 Negative abdominal  radiographs. No acute  cardiopulmonary disease.  Dg Abd 2 Views 01/15/2014 : No evidence of small bowel obstruction or free air.  Dg Abd 01/16/2014: No evidence of bowel obstruction.  Flexible sigmoidoscopy 01/18/14: The colonoscope was advanced to approximately the region of the splenic flexure. There was stenosis of the colon with hyperemic mucosa suggesting extrinsic compression. The scope would not pass beyond. No obvious primary mucosal lesion. Left-sided diverticulosis present.  Laparoscopic colostomy and insertion of Port-A-Cath right internal jugular done by Dr. Excell Seltzer 9/37/90: No complications noted.  Dg Abd Portable 1v 01/25/2014: Colonic distention has developed since the prior study consistent with a partial colonic obstruction at the level of the splenic flexure.  Ct Abdomen Pelvis W Contrast 01/26/2014 1. There is colonic obstruction with distension of the right colon and transverse colon due to incarcerated distal transverse colonic herniation at the colostomy site in left lower abdomen anteriorly. Best seen in coronal image 14. 2. The previous mass in left upper abdomen has been removed. There are postsurgical changes in left upper abdomen and small amount of perisplenic ascites. 3. Degenerative changes lumbar spine. 4. No hydronephrosis or hydroureter. 5. Distended urinary bladder. Retroflexed uterus. Small amount of pelvic free fluid within posterior pelvis.  Exploratory Laparotomy, colostomy, excision of colonic fistula and end colostomy 01/26/2014 Medical Consultants:   Dr. Heath Lark, Oncology.  Dr. Scarlette Shorts, GI.  Dr. Excell Seltzer, Surgery. Other Consultants:   Physical therapy: Home health PT with 24-hour supervision/assistance. Walker with 5 inch wheels. Anti-Infectives:   None.   [name], MD  [facility] Pager [phone]  If 7PM-7AM, please contact night-coverage [email or web address] Password [facility] 01/28/2014, 8:47 AM   LOS: 14 days    HPI/Subjective: No acute  overnight events.  Objective: Filed Vitals:   01/27/14 1148 01/27/14 1400 01/27/14 2113 01/28/14 0524  BP:  116/64 149/74 126/85  Pulse:  100 117 103  Temp:  98.4 F (36.9 C) 98.1 F (36.7 C) 98.7 F (37.1 C)  TempSrc:  Oral Oral Oral  Resp: 20 18 20 18   Height:      Weight:    58.469 kg (128 lb 14.4 oz)  SpO2:  98% 98% 98%    Intake/Output Summary (Last 24 hours) at 01/28/14 0847 Last data filed at 01/28/14 0543  Gross per 24 hour  Intake    380 ml  Output   1575 ml  Net  -1195 ml    Exam:   General:  Pt is alert,  not in acute distress  Cardiovascular: tachycardic, S1/S2 appreciated   Respiratory: No wheezing, no crackles, bilateral air entry  Abdomen: 2 colostomies, abdomen nontender in upper and mid area, no guarding, distended  Extremities: e trace pedal edema, pulses DP and PT palpable bilaterally  Neuro: Grossly nonfocal  Data Reviewed: Basic Metabolic Panel:  Recent Labs Lab 01/23/14 0418 01/24/14 0418 01/25/14 0400 01/26/14 1446  NA 137 136* 135* 131*  K 3.4* 3.6* 4.7 4.7  CL 99 100 99 93*  CO2 23 20 23 22   GLUCOSE 101* 95 109* 104*  BUN 13 14 12 10   CREATININE 0.83 0.77 0.75 0.88  CALCIUM 8.3* 8.7 9.4 10.9*   Liver Function Tests: No results found for this basename: AST, ALT, ALKPHOS, BILITOT, PROT, ALBUMIN,  in the last 168 hours No results found for this basename: LIPASE, AMYLASE,  in the last 168 hours No results found for this basename: AMMONIA,  in the last 168 hours CBC:  Recent Labs Lab 01/23/14 0418 01/24/14 0418 01/25/14 0400 01/26/14 1446  WBC 4.5 5.5 6.4 9.5  HGB 10.4* 11.2* 11.0* 13.4  HCT 30.6* 34.4* 32.8* 40.6  MCV 88.7 90.3 88.4 90.2  PLT 202 215 247 284   Cardiac Enzymes: No results found for this basename: CKTOTAL, CKMB, CKMBINDEX, TROPONINI,  in the last 168 hours BNP: No components found with this basename: POCBNP,  CBG:  Recent Labs Lab 01/26/14 2254 01/27/14 1122 01/27/14 1712 01/27/14 2118  01/28/14 0748  GLUCAP 120* 97 110* 219* 107*    SURGICAL PCR SCREEN     Status: None   Collection Time    01/19/14  8:10 PM      Result Value Ref Range Status   MRSA, PCR NEGATIVE  NEGATIVE Final   Staphylococcus aureus NEGATIVE  NEGATIVE Final     Scheduled Meds: . ALPRAZolam  1 mg Oral QHS  . atorvastatin  80 mg Oral QHS  . enoxaparin (LOVENOX) injection  40 mg Subcutaneous Q24H  . insulin aspart  0-9 Units Subcutaneous TID WC  . lisinopril  2.5 mg Oral q morning - 10a  . pantoprazole  40 mg Oral Daily  . triamterene-hydrochlorothiazide  1 tablet Oral q morning - 10a   Continuous Infusions: . 0.9 % NaCl with KCl 20 mEq / L 75 mL/hr at 01/28/14 0022

## 2014-01-28 NOTE — Progress Notes (Addendum)
Clinical Social Work Department CLINICAL SOCIAL WORK PLACEMENT NOTE 01/28/2014  Patient:  Karen Arias, Karen Arias  Account Number:  [id number] Admit date:  01/14/2014  Clinical Social Worker:  Ulyess Blossom  Date/time:  01/28/2014 06:28 PM  Clinical Social Work is seeking post-discharge placement for this patient at the following level of care:   SKILLED NURSING   (*CSW will update this form in Epic as items are completed)   01/28/2014  Patient/family provided with [place] Department of Clinical Social Work's list of facilities offering this level of care within the geographic area requested by the patient (or if unable, by the patient's family).  01/28/2014  Patient/family informed of their freedom to choose among providers that offer the needed level of care, that participate in Medicare, Medicaid or managed care program needed by the patient, have an available bed and are willing to accept the patient.  01/28/2014  Patient/family informed of [facility]' ownership interest in [facility], as well as of the fact that they are under no obligation to receive care at this facility.  PASARR submitted to EDS on 01/28/2014 PASARR number received on 01/28/2014  FL2 transmitted to all facilities in geographic area requested by pt/family on  01/28/2014 FL2 transmitted to all facilities within larger geographic area on   Patient informed that his/her managed care company has contracts with or will negotiate with  certain facilities, including the following:     Patient/family informed of bed offers received:  01/31/2014 Patient chooses bed at  [facility] recommends and patient chooses bed at    Patient to be transferred to  on  [facility] on 02/01/2014 Patient to be transferred to facility by ambulance Corey Harold) Patient and family notified of transfer on 02/01/2014 Name of family member notified:  Pt and pt daughter, Olin Hauser  The  following physician request were entered in Epic:   Additional Comments:   Alison Murray, MSW, [place] Work [phone]

## 2014-01-28 NOTE — Progress Notes (Signed)
2 Days Post-Op  Subjective: PT feeling much better today.  Tol Liquids   Objective: Vital signs in last 24 hours: Temp:  [98.1 F (36.7 C)-98.7 F (37.1 C)] 98.7 F (37.1 C) (09/18 0524) Pulse Rate:  [100-117] 103 (09/18 0524) Resp:  [18-20] 18 (09/18 0524) BP: (116-149)/(64-85) 126/85 mmHg (09/18 0524) SpO2:  [98 %-99 %] 98 % (09/18 0524) Weight:  [128 lb 14.4 oz (58.469 kg)] 128 lb 14.4 oz (58.469 kg) (09/18 0524) Last BM Date: 01/27/14  Intake/Output from previous day: 09/17 0701 - 09/18 0700 In: 380 [P.O.:380] Out: 1575 [Urine:725; Stool:850] Intake/Output this shift:    General appearance: alert and cooperative Resp: clear to auscultation bilaterally Cardio: regular rate and rhythm, S1, S2 normal, no murmur, click, rub or gallop GI: soft, approp ttp, ND, ostomy pink/patent  Lab Results:   Recent Labs  01/26/14 1446  WBC 9.5  HGB 13.4  HCT 40.6  PLT 284   BMET  Recent Labs  01/26/14 1446  NA 131*  K 4.7  CL 93*  CO2 22  GLUCOSE 104*  BUN 10  CREATININE 0.88  CALCIUM 10.9*    Assessment/Plan: s/p Procedure(s): EXPLORATORY LAPAROTOMY, COLOSTOMY, EXCISION OF MUCUS FISTULA AND END COLOSTOMY (N/A) Adv diet Mobilize Dressing changes BID Pt coming along well  LOS: 14 days    Rosario Jacks., [facility] 01/28/2014

## 2014-01-28 NOTE — Progress Notes (Signed)
Physical Therapy Treatment Patient Details Name: Karen Arias MRN: [id number] DOB: 1929-02-24 Today's Date: February 08, 2014    History of Present Illness 80 yo female s/p lap colostomy, pancreatic Cancer.  pt with ex lap and end colostomy on 01/26/14.     PT Comments    Pt assisted with ambulating short distance in hallway.  Pt requiring increased assist for OOB due to abdominal pain and currently min assist with standing and ambulation.  Pt presents as fall risk.  Would recommend ST-SNF prior to home as uncertain if family would be able to provide required assist.   Follow Up Recommendations  Supervision/Assistance - 24 hour;SNF     Equipment Recommendations  Rolling walker with 5" wheels    Recommendations for Other Services       Precautions / Restrictions Precautions Precautions: Fall Precaution Comments: adominal surgery; colostomy    Mobility  Bed Mobility Overal bed mobility: Needs Assistance Bed Mobility: Rolling;Sidelying to Sit Rolling: Mod assist Sidelying to sit: HOB elevated;Mod assist       General bed mobility comments: verbal cues for log roll technique for abdominal pain control  Transfers Overall transfer level: Needs assistance Equipment used: Rolling walker (2 wheeled) Transfers: Sit to/from Stand Sit to Stand: Min assist         General transfer comment: assist for rise and steadying as well as controlling descent  Ambulation/Gait Ambulation/Gait assistance: Min assist Ambulation Distance (Feet): 40 Feet Assistive device: Rolling walker (2 wheeled) Gait Pattern/deviations: Step-through pattern;Decreased stride length;Shuffle Gait velocity: decr   General Gait Details: slow gait speed, verbal cues for RW positioning, occasional assist for maneuvering RW   Stairs            Wheelchair Mobility    Modified Rankin (Stroke Patients Only)       Balance                                    Cognition  Arousal/Alertness: Awake/alert Behavior During Therapy: WFL for tasks assessed/performed Overall Cognitive Status: Within Functional Limits for tasks assessed                      Exercises      General Comments        Pertinent Vitals/Pain Pain Assessment: 0-10 Pain Score: 3  Pain Location: abdomen Pain Descriptors / Indicators: Sore Pain Intervention(s): Monitored during session;Repositioned    Home Living                      Prior Function            PT Goals (current goals can now be found in the care plan section) Progress towards PT goals: Progressing toward goals    Frequency  Min 3X/week    PT Plan Current plan remains appropriate    Co-evaluation             End of Session Equipment Utilized During Treatment: Gait belt Activity Tolerance: Patient tolerated treatment well Patient left: in chair;with call bell/phone within reach;with family/visitor present     Time: 9458-5929 PT Time Calculation (min): 19 min  Charges:  $Gait Training: 8-22 mins                    G Codes:      Carys Malina,KATHrine E 08-Feb-2014, 3:22 PM Carmelia Bake, PT, DPT 2014-02-08 Pager: [phone]

## 2014-01-28 NOTE — Progress Notes (Signed)
Clinical Social Work Department BRIEF PSYCHOSOCIAL ASSESSMENT 01/28/2014  Patient:  Karen Arias, Karen Arias     Account Number:  [id number]     Admit date:  01/14/2014  Clinical Social Worker:  Ulyess Blossom  Date/Time:  01/28/2014 04:30 PM  Referred by:  Physician  Date Referred:  01/28/2014 Referred for  SNF Placement   Other Referral:   Interview type:  Patient Other interview type:   and patient family at bedside    PSYCHOSOCIAL DATA Living Status:  FAMILY Admitted from facility:   Level of care:   Primary support name:  Pam Alston/daughter/[phone] Primary support relationship to patient:  CHILD, ADULT Degree of support available:   strong    CURRENT CONCERNS Current Concerns  Post-Acute Placement   Other Concerns:    SOCIAL WORK ASSESSMENT / PLAN CSW reviewed chart and noted that PT reevaluated pt following pt second surgery and PT now recommending ST SNF.    CSW met with pt and pt family at bedside. CSW introduced self and explained role. CSW provided support as pt discussed that she was hoping to return home upon discharge, but admits that she has been feeling more weak. CSW discussed options for short term rehab at [facility] before returning home. Pt initially hesitant, but pt daughter expressed to pt that pt family feels  more comfortable with pt having some short term rehab before returning home. Pt agreeable to exploring SNF options for short term rehab in [facility], but wanted the [employer] excluded from the search. CSW expressed understanding and clarified pt and pt family questions and concerns and reviewed insurance coverage for SNF as pt has [employer].    CSW completed FL2 and initiated SNF search to [facility].    CSW to follow up with pt and pt family re: SNF bed offers.    CSW to continue to follow to provide support and assist with disposition planning.   Assessment/plan status:  Psychosocial Support/Ongoing Assessment of Needs Other  assessment/ plan:   discharge planning   Information/referral to community resources:   [facility] list    PATIENT'S/FAMILY'S RESPONSE TO PLAN OF CARE: Pt alert and oriented x 4. Pt initially hesitant about SNF for rehab, but agreeable to exploring SNF options and appeared more open to option of short term rehab as the conversation continued. Pt family supportive and activley involved in pt care.       Alison Murray, MSW, [place] Work [phone]

## 2014-01-29 LAB — GLUCOSE, CAPILLARY
GLUCOSE-CAPILLARY: 97 mg/dL (ref 70–99)
GLUCOSE-CAPILLARY: 148 mg/dL — AB (ref 70–99)
GLUCOSE-CAPILLARY: 196 mg/dL — AB (ref 70–99)
Glucose-Capillary: 139 mg/dL — ABNORMAL HIGH (ref 70–99)

## 2014-01-29 NOTE — Progress Notes (Signed)
General Surgery Note  LOS: 15 days  POD -  3 Days Post-Op Med Onc - N. Gorsuch  Assessment/Plan: 1.  Obstructing pancreatic mass  A. LAPAROSCOPIC COLOSTOMY INSERTION PORT-A-CATH RIGHT INTERNAL JUGULAR - 01/20/2014 - [place]  B. EXPLORATORY LAPAROTOMY, COLOSTOMY, EXCISION OF MUCUS FISTULA AND END COLOSTOMY - 01/26/2014 - [facility]  Looks good.  To increase to reg diet.  Needs to ambulate more.  1A.  Open wound 2.  HTN 3.  DM 4.  DVT prophylaxis - Lovenox 5.  Foley still in - will remove   Principal Problem:   Colonic obstruction secondary to pancreatic mass status post diverting colostomy Active Problems:   Essential hypertension, benign   Diabetes mellitus type 2 in nonobese   Abdominal pain   Nonspecific (abnormal) findings on radiological and other examination of gastrointestinal tract   Acute kidney injury   Urinary retention   Subjective:  She is doing well.  Her husband is at the bedside.  She did not want foley removed because she was afraid of wetting the bed. Objective:   Filed Vitals:   01/29/14 0514  BP: 131/85  Pulse: 105  Temp: 98.7 F (37.1 C)  Resp: 16     Intake/Output from previous day:  09/18 0701 - 09/19 0700 In: 1620 [P.O.:720; I.V.:900] Out: 2400 [Urine:2400]  Intake/Output this shift:      Physical Exam:   General: Older AA F who is alert and oriented.    HEENT: Normal. Pupils equal. .   Lungs: Clear   Abdomen: Soft.  Few BS.   Wound: RUQ ostomy with stool.  Midline wound open and clean.  L ostomy - mucous fistula   Lab Results:    Recent Labs  01/26/14 1446  WBC 9.5  HGB 13.4  HCT 40.6  PLT 284    BMET   Recent Labs  01/26/14 1446  NA 131*  K 4.7  CL 93*  CO2 22  GLUCOSE 104*  BUN 10  CREATININE 0.88  CALCIUM 10.9*    PT/INR   Recent Labs  01/26/14 1446  LABPROT 13.7  INR 1.05    ABG  No results found for this basename: PHART, PCO2, PO2, HCO3,  in the last 72 hours   Studies/Results:  No results  found.   Anti-infectives:   Anti-infectives   Start     Dose/Rate Route Frequency Ordered Stop   01/27/14 0600  [MAR Hold]  cefoTEtan (CEFOTAN) 2 g in dextrose 5 % 50 mL IVPB     (On MAR Hold since 01/26/14 1454)   2 g 100 mL/hr over 30 Minutes Intravenous On call to O.R. 01/26/14 1425 01/26/14 1530   01/20/14 0600  [MAR Hold]  cefoTEtan (CEFOTAN) 2 g in dextrose 5 % 50 mL IVPB     (On MAR Hold since 01/20/14 1244)  Comments:  Pharmacy may adjust dose strength for optimal dosing.   Send with patient on call to the OR.  Anesthesia to complete antibiotic administration <53min prior to incision per [facility].   2 g 100 mL/hr over 30 Minutes Intravenous On call to O.R. 01/19/14 1548 01/20/14 [place], MD, FACS Pager: [facility] Office: [phone] 01/29/2014

## 2014-01-29 NOTE — Progress Notes (Signed)
Patient ID: Karen Arias, female   DOB: February 23, 1929, 80 y.o.   MRN: [id number] [facility] PROGRESS NOTE  ELEYNA BRUGH OZH:[id number] DOB: 12-20-1928 DOA: 01/14/2014 PCP: [name], MD  Brief narrative: 80 y.o. female with a PMH of hypertension, hiatal hernia (EGD in 09/2012) who followed with Dr. [name] for GI related issues who was admitted 01/14/14 with chief complaint of abdominal pain and constipation. She was found to have LUQ soft tissue mass (5.7 cm in diameter) on CT abdomen causing obstruction of the colon at the level of the splenic flexure, worrisome for malignancy. Her tumor marker CA 19-9 elevated, concerning for pancreatic cancer. Colonoscopy prep attempted but patient could not tolerate well. Underwent a flexible sigmoidoscopy 01/18/14 which showed stenosis of the colon at the splenic flexure with extrinsic compression. Underwent diverting colostomy 01/20/14. 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 She subsequently underwent exploratory laparotomy, colostomy, excision of colonic fistula and end colostomy 01/26/2014.   Assessment/Plan:   Principal Problem:  Left upper quadrant mass causing bowel obstruction, probable pancreatic cancer status post diverting colostomy  On admission, pt was found to have left upper quadrant mass worrisome for pancreatic cancer. CEA WNL. CA 19-9 3434. CA-125 is 64.  Patient is status post flexible sigmoidoscopy 01/18/14 showing stenosis of the colon at the splenic flexure with extrinsic compression (unable to tolerate prep for full colonoscopy).  Patient is status post diverting colostomy 01/20/14. Ostomy nurse following for education of ostomy care. Patient with more abdominal distention and pain 01/26/2014. CT abdomen 01/26/2014 showed colonic obstruction with distention of the right colon due ot distal transverse colonic herniation at the colostomy site. Patient subsequently underwent exploratory laparotomy,  colostomy, excision of colonic fistula and end colostomy 01/26/2014.  Plan for palliative chemo once medically stable (per Dr. [name] of oncology). Continue pain management efforts. Diet will be advanced to regular diet today per surgery recommendation.  Active Problems:  Hypokalemia  Supplemented. Follow up BMP in am. Dyslipidemia  Continue statin therapy Postoperative urinary retention  Resolved, Foley successfully discontinued 01/22/14 after a voiding trial.  Acute renal failure  Creatinine elevation noted 01/19/14. Possibly secondary to contrast nephropathy. BP medications including lisinopril/Maxide were held and the patient has been gently hydrated. KVO IVF.  Creatinine returned to baseline by 01/23/14 so Lisinopril and Maxzide restarted. Essential hypertension  Continue Maxzide and Lisinopril. BP 120/72 this am. Diabetes mellitus  CBG's in past 24 hours: 97, 148, 196 DVT Prophylaxis  Continue Lovenox.    Code Status: Full.  Family Communication: Family at the bedside this am.  Disposition Plan: Home when stable with home health needs.  IV Access:   Peripheral IV  Port-A-Cath placed 01/20/14 Procedures and diagnostic studies:   Ct Abdomen Pelvis W Contrast 01/14/2014 Left upper quadrant soft tissue mass causing obstruction of the colon at the level of the splenic flexure. The mass measures approximately 5.7 cm in greatest dimensions. Exact origin of the tumor is not entirely clear by CT. 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 Second most likely origin would be the colon. No associated colonic perforation is identified by CT. No associated enlarged lymph nodes or distant metastatic lesions are identified in the abdomen or pelvis.  Dg Abd Acute W/chest 01/14/2014 Negative abdominal radiographs. No acute cardiopulmonary disease.  Dg Abd 2 Views 01/15/2014 : No evidence of small bowel obstruction or free air.  Dg Abd 01/16/2014: No evidence of  bowel obstruction.  Flexible sigmoidoscopy 01/18/14: The colonoscope was advanced to approximately the region of the splenic flexure. There was stenosis of the colon with hyperemic mucosa suggesting extrinsic compression. The scope would not pass beyond. No obvious primary mucosal lesion. Left-sided diverticulosis present.  Laparoscopic colostomy and insertion of Port-A-Cath right internal jugular done by Dr. Excell Seltzer 2/35/57: No complications noted.  Dg Abd Portable 1v 01/25/2014: Colonic distention has developed since the prior study consistent with a partial colonic obstruction at the level of the splenic flexure.  Ct Abdomen Pelvis W Contrast 01/26/2014 1. There is colonic obstruction with distension of the right colon and transverse colon due to incarcerated distal transverse colonic herniation at the colostomy site in left lower abdomen anteriorly. Best seen in coronal image 14. 2. The previous mass in left upper abdomen has been removed. There are postsurgical changes in left upper abdomen and small amount of perisplenic ascites. 3. Degenerative changes lumbar spine. 4. No hydronephrosis or hydroureter. 5. Distended urinary bladder. Retroflexed uterus. Small amount of pelvic free fluid within posterior pelvis.  Exploratory Laparotomy, colostomy, excision of colonic fistula and end colostomy 01/26/2014 Medical Consultants:   Dr. Heath Lark, Oncology.  Dr. Scarlette Shorts, GI.  Dr. Excell Seltzer, Surgery. Other Consultants:   Physical therapy: Home health PT with 24-hour supervision/assistance. Walker with 5 inch wheels. Anti-Infectives:   None.   [name], MD  [facility] Pager [phone]  If 7PM-7AM, please contact night-coverage [email or web address] Password [facility] 01/29/2014, 5:36 PM   LOS: 15 days    HPI/Subjective: No acute overnight events.  Objective: Filed Vitals:   01/28/14 1349 01/28/14 2107 01/29/14 0514 01/29/14 1438  BP: 124/74 121/64 131/85 120/72  Pulse: 106 98 105  108  Temp: 98.8 F (37.1 C) 98.4 F (36.9 C) 98.7 F (37.1 C) 99.7 F (37.6 C)  TempSrc: Oral Oral Oral Oral  Resp: 16 16 16 16   Height:      Weight:   58.151 kg (128 lb 3.2 oz)   SpO2: 99% 98% 99% 99%    Intake/Output Summary (Last 24 hours) at 01/29/14 1736 Last data filed at 01/29/14 1300  Gross per 24 hour  Intake   1500 ml  Output   1750 ml  Net   -250 ml    Exam:   General:  Pt is not in distress, sleeping lightly, easily woken up  Cardiovascular: Regular rate and rhythm, S1/S2 appreciated, SEM appreciated   Respiratory: no wheezing, bilateral air entry  Abdomen: left and right ostomy in place, non tender abdomen, (+) BS  Extremities: No edema, pulses DP and PT palpable bilaterally  Neuro: Non-focal  Data Reviewed: Basic Metabolic Panel:  Recent Labs Lab 01/23/14 0418 01/24/14 0418 01/25/14 0400 01/26/14 1446  NA 137 136* 135* 131*  K 3.4* 3.6* 4.7 4.7  CL 99 100 99 93*  CO2 23 20 23 22   GLUCOSE 101* 95 109* 104*  BUN 13 14 12 10   CREATININE 0.83 0.77 0.75 0.88  CALCIUM 8.3* 8.7 9.4 10.9*   Liver Function Tests: No results found for this basename: AST, ALT, ALKPHOS, BILITOT, PROT, ALBUMIN,  in the last 168 hours No results found for this basename: LIPASE, AMYLASE,  in the last 168 hours No results found for this basename: AMMONIA,  in the last 168 hours CBC:  Recent Labs Lab 01/23/14 0418 01/24/14 0418 01/25/14 0400 01/26/14 1446  WBC 4.5 5.5 6.4 9.5  HGB 10.4* 11.2* 11.0* 13.4  HCT 30.6* 34.4* 32.8* 40.6  MCV 88.7 90.3 88.4 90.2  PLT 202 215 247 284   Cardiac Enzymes: No results found for this basename: CKTOTAL, CKMB, CKMBINDEX, TROPONINI,  in the last 168 hours BNP: No components found with this basename: POCBNP,  CBG:  Recent Labs Lab 01/28/14 1747 01/28/14 2112 01/29/14 0747 01/29/14 1210 01/29/14 1718  GLUCAP 152* 110* 97 148* 196*    Recent Results (from the past 240 hour(s))  SURGICAL PCR SCREEN     Status: None    Collection Time    01/19/14  8:10 PM      Result Value Ref Range Status   MRSA, PCR NEGATIVE  NEGATIVE Final   Staphylococcus aureus NEGATIVE  NEGATIVE Final     Scheduled Meds: . ALPRAZolam  1 mg Oral QHS  . atorvastatin  80 mg Oral QHS  . enoxaparin (LOVENOX)   40 mg Subcutaneous Q24H  . insulin aspart  0-9 Units Subcutaneous TID WC  . lisinopril  2.5 mg Oral q morning - 10a  . pantoprazole  40 mg Oral Daily  . triamterene-hydrochlorothiazide  1 tablet Oral q morning - 10a   Continuous Infusions: . 0.9 % NaCl with KCl 20 mEq / L 75 mL/hr at 01/28/14 1405

## 2014-01-30 LAB — GLUCOSE, CAPILLARY
Glucose-Capillary: 148 mg/dL — ABNORMAL HIGH (ref 70–99)
Glucose-Capillary: 195 mg/dL — ABNORMAL HIGH (ref 70–99)
Glucose-Capillary: 214 mg/dL — ABNORMAL HIGH (ref 70–99)
Glucose-Capillary: 111 mg/dL — ABNORMAL HIGH (ref 70–99)

## 2014-01-30 NOTE — Progress Notes (Signed)
General Surgery Note  LOS: 16 days  POD -  4 Days Post-Op Med Onc - N. Gorsuch  Assessment/Plan: 1.  Obstructing pancreatic mass  A. LAPAROSCOPIC COLOSTOMY INSERTION PORT-A-CATH RIGHT INTERNAL JUGULAR - 01/20/2014 - [facility]  B. EXPLORATORY LAPAROTOMY, COLOSTOMY, EXCISION OF MUCUS FISTULA AND END COLOSTOMY - 01/26/2014 - [facility] - N. [name]  Looks good.  Tolerating reg diet.  Still needs to ambulate more.  1A.  Open wound - clean 2.  HTN 3.  DM 4.  DVT prophylaxis - Lovenox 5.  Tolerated foley out   Principal Problem:   Colonic obstruction secondary to pancreatic mass status post diverting colostomy Active Problems:   Essential hypertension, benign   Diabetes mellitus type 2 in nonobese   Abdominal pain   Nonspecific (abnormal) findings on radiological and other examination of gastrointestinal tract   Acute kidney injury   Urinary retention   Subjective:  She is doing well.  Her husband is at the bedside.    She needs to ambulate and I have reinforced this with her.  Objective:   Filed Vitals:   01/30/14 0459  BP: 138/76  Pulse: 94  Temp: 99.5 F (37.5 C)  Resp: 16     Intake/Output from previous day:  09/19 0701 - 09/20 0700 In: 63 [P.O.:460] Out: 900 [Urine:850; Stool:50]  Intake/Output this shift:      Physical Exam:   General: Older AA F who is alert and oriented.    HEENT: Normal. Pupils equal. .   Lungs: Clear   Abdomen: Soft.  Few BS.   Wound: RUQ ostomy with stool.  Midline wound open and clean.  L ostomy - mucous fistula   Lab Results:   No results found for this basename: WBC, HGB, HCT, PLT,  in the last 72 hours  BMET  No results found for this basename: NA, K, CL, CO2, GLUCOSE, BUN, CREATININE, CALCIUM,  in the last 72 hours  PT/INR  No results found for this basename: LABPROT, INR,  in the last 72 hours  ABG  No results found for this basename: PHART, PCO2, PO2, HCO3,  in the last 72 hours   Studies/Results:  No  results found.   Anti-infectives:   Anti-infectives   Start     Dose/Rate Route Frequency Ordered Stop   01/27/14 0600  [MAR Hold]  cefoTEtan (CEFOTAN) 2 g in dextrose 5 % 50 mL IVPB     (On MAR Hold since 01/26/14 1454)   2 g 100 mL/hr over 30 Minutes Intravenous On call to O.R. 01/26/14 1425 01/26/14 1530   01/20/14 0600  [MAR Hold]  cefoTEtan (CEFOTAN) 2 g in dextrose 5 % 50 mL IVPB     (On MAR Hold since 01/20/14 1244)  Comments:  Pharmacy may adjust dose strength for optimal dosing.   Send with patient on call to the OR.  Anesthesia to complete antibiotic administration <39min prior to incision per [facility].   2 g 100 mL/hr over 30 Minutes Intravenous On call to O.R. 01/19/14 1548 01/20/14 [place], MD, FACS Pager: [phone] Surgery Office: [phone] 01/30/2014

## 2014-01-30 NOTE — Progress Notes (Signed)
CARE MANAGEMENT NOTE 01/30/2014  Patient:  Karen Arias, Karen Arias   Account Number:  [id number]  Date Initiated:  01/24/2014  Documentation initiated by:  Leafy Kindle  Subjective/Objective Assessment:   Pt admitted with abd pain     Action/Plan:   from home alone   Anticipated DC Date:  01/30/2014   Anticipated DC Plan:  [place]  CM consult      Choice offered to / List presented to:  C-1 Patient        [place] arranged  HH-1 RN  [place].   Status of service:  Completed, signed off Medicare Important Message given?  YES (If response is "NO", the following Medicare IM given date fields will be blank) Date Medicare IM given:  01/24/2014 Medicare IM given by:  Leafy Kindle Date Additional Medicare IM given:  01/30/2014 Additional Medicare IM given by:  Jonnie Finner  Discharge Disposition:  [place]  Per UR Regulation:  Reviewed for med. necessity/level of care/duration of stay  If discussed at [place] of Stay Meetings, dates discussed:   01/20/2014  01/25/2014    Comments:  01/24/14 Marney Doctor Rn,BSN,NCM Pt from home alone but family checks in on her often.  PT is recommending HHPT.  Met with pt and daughter.  Pt states that she would like to use [facility] for her [place] because some of her family has used them in the past.  Daughter was concerned about pt going home with the new ostomy. Daughter and Pt were informed that a HHRN could help reinforce ostomy teaching in the home at DC.  [facility] rep called to inform of referral.  Will need MD orders for [facility] and PT at DC.  CM will continue to follow for DC needs.

## 2014-01-30 NOTE — Progress Notes (Signed)
Patient ID: LOTTIE SIGMAN, female   DOB: 01-30-1929, 80 y.o.   MRN: [id number] [facility] PROGRESS NOTE  VALITA RIGHTER QMG:[id number] DOB: [birth year] DOA: 01/14/2014 PCP: [name], MD  Brief narrative: 80 y.o. female with a PMH of hypertension, hiatal hernia (EGD in 09/2012) who followed with Dr. [name] for GI related issues who was admitted 01/14/14 with chief complaint of abdominal pain and constipation. She was found to have LUQ soft tissue mass (5.7 cm in diameter) on CT abdomen causing obstruction of the colon at the level of the splenic flexure, worrisome for malignancy. Her tumor marker CA 19-9 elevated, concerning for pancreatic cancer. Colonoscopy prep attempted but patient could not tolerate well. Underwent a flexible sigmoidoscopy 01/18/14 which showed stenosis of the colon at the splenic flexure with extrinsic compression. Underwent diverting colostomy 01/20/14. 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 She subsequently underwent exploratory laparotomy, colostomy, excision of colonic fistula and end colostomy 01/26/2014.   Assessment/Plan:   Principal Problem:  Left upper quadrant mass causing bowel obstruction, probable pancreatic cancer status post diverting colostomy  Patient was found to have left upper quadrant mass on admission worrisome for pancreatic cancer. CEA WNL. CA 19-9 3434. CA-125 - 64.  Patient is status post the following procedures and/or operations: 1. Flexible sigmoidoscopy 01/18/14 showing stenosis of the colon at the splenic flexure with extrinsic compression (unable to tolerate prep for full colonoscopy); 2. Diverting colostomy 01/20/14. Ostomy nurse following for education of ostomy care; 3. Exploratory laparotomy, colostomy, excision of colonic fistula and end colostomy 01/26/2014. THis last operation was because pt more abdominal distention and pain. She had CT abdomen 01/26/2014 which showed colonic obstruction with  distention of the right colon due ot distal transverse colonic herniation at the colostomy site. Plan for palliative chemo once medically stable (per Dr. [name] of oncology).  Continue pain management efforts.  Diet as tolerated. Encouraged to ambulate. PT order placed for daily PT in hospital. Home vs SNF on discharge depending on progress.  Active Problems:  Hypokalemia  Supplemented. Mar[place] Kitchen Dyslipidemia   Continue statin therapy Postoperative urinary retention  Resolved, Foley successfully discontinued 01/22/14 after a voiding trial.  Acute renal failure  Creatinine elevation noted 01/19/14. Possibly secondary to contrast nephropathy. BP medications including lisinopril/Maxide were held and the patient has been gently hydrated. KVO IVF.  Creatinine returned to baseline by 01/23/14 so Lisinopril and Maxzide restarted. Essential hypertension  Continue Maxzide and Lisinopril.  Diabetes mellitus   CBG's in past 24 hours: 196, 139, 148. Continue sliding scale insulin. DVT Prophylaxis  Continue Lovenox while pt is in hospital.    Code Status: Full.  Family Communication: Family at the bedside this am.  Disposition Plan: Home when stable with home health needs versus SNF.   IV Access:   Peripheral IV  Port-A-Cath placed 01/20/14 Procedures and diagnostic studies:   Ct Abdomen Pelvis W Contrast 01/14/2014 Left upper quadrant soft tissue mass causing obstruction of the colon at the level of the splenic flexure. The mass measures approximately 5.7 cm in greatest dimensions. Exact origin of the tumor is not entirely clear by CT. 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 Second most likely origin would be the colon. No associated colonic perforation is identified by CT. No associated enlarged lymph nodes or distant metastatic lesions are identified in the abdomen or pelvis.  Dg Abd Acute W/chest 01/14/2014 Negative abdominal radiographs. No acute  cardiopulmonary disease.  Dg Abd 2 Views 01/15/2014 : No evidence of small bowel obstruction or free air.  Dg Abd 01/16/2014: No evidence of bowel obstruction.  Flexible sigmoidoscopy 01/18/14: The colonoscope was advanced to approximately the region of the splenic flexure. There was stenosis of the colon with hyperemic mucosa suggesting extrinsic compression. The scope would not pass beyond. No obvious primary mucosal lesion. Left-sided diverticulosis present.  Laparoscopic colostomy and insertion of Port-A-Cath right internal jugular done by Dr. Excell Seltzer 08/20/79: No complications noted.  Dg Abd Portable 1v 01/25/2014: Colonic distention has developed since the prior study consistent with a partial colonic obstruction at the level of the splenic flexure.  Ct Abdomen Pelvis W Contrast 01/26/2014 1. There is colonic obstruction with distension of the right colon and transverse colon due to incarcerated distal transverse colonic herniation at the colostomy site in left lower abdomen anteriorly. Best seen in coronal image 14. 2. The previous mass in left upper abdomen has been removed. There are postsurgical changes in left upper abdomen and small amount of perisplenic ascites. 3. Degenerative changes lumbar spine. 4. No hydronephrosis or hydroureter. 5. Distended urinary bladder. Retroflexed uterus. Small amount of pelvic free fluid within posterior pelvis.  Exploratory Laparotomy, colostomy, excision of colonic fistula and end colostomy 01/26/2014 Medical Consultants:   Dr. Heath Lark, Oncology.  Dr. Scarlette Shorts, GI.  Dr. Excell Seltzer, Surgery. Other Consultants:   Physical therapy: Home health PT with 24-hour supervision/assistance. Walker with 5 inch wheels.Orders placed. Pt may however need SNF placement per more recent PT evaluation. Order placed for daily PT and if pt feels better may still end up going home with HHPT. Anti-Infectives:   None.  [name], MD  [facility] Pager  [phone]  If 7PM-7AM, please contact night-coverage [email or web address] Password [email or web address] 01/30/2014, 12:01 PM   LOS: 16 days    HPI/Subjective: No acute overnight events.  Objective: Filed Vitals:   01/29/14 0514 01/29/14 1438 01/29/14 2108 01/30/14 0459  BP: 131/85 120/72 156/107 138/76  Pulse: 105 108 107 94  Temp: 98.7 F (37.1 C) 99.7 F (37.6 C) 98.6 F (37 C) 99.5 F (37.5 C)  TempSrc: Oral Oral Oral Oral  Resp: 16 16 16 16   Height:      Weight: 58.151 kg (128 lb 3.2 oz)   57.788 kg (127 lb 6.4 oz)  SpO2: 99% 99% 99% 98%    Intake/Output Summary (Last 24 hours) at 01/30/14 1201 Last data filed at 01/30/14 0205  Gross per 24 hour  Intake    340 ml  Output    900 ml  Net   -560 ml    Exam:   General:  Pt is awake, oriented to time, place and person  Cardiovascular: Regular rate and rhythm, S1/S2 appreciated   Respiratory: bilateral air entry, no wheezing   Abdomen: 2 colostomies, stool in right and no stool in left, softly distended abdomen, minimally tender to palpation, (+) bowel sounds   Extremities: No edema, pulses DP and PT palpable bilaterally  Neuro: alert, no focal neuro deficits   Data Reviewed: Basic Metabolic Panel:  Recent Labs Lab 01/24/14 0418 01/25/14 0400 01/26/14 1446  NA 136* 135* 131*  K 3.6* 4.7 4.7  CL 100 99 93*  CO2 20 23 22   GLUCOSE 95 109* 104*  BUN 14 12 10   CREATININE 0.77 0.75 0.88  CALCIUM 8.7 9.4 10.9*   Liver Function Tests: No results found for this basename: AST, ALT, ALKPHOS, BILITOT, PROT, ALBUMIN,  in the last 168 hours  No results found for this basename: LIPASE, AMYLASE,  in the last 168 hours No results found for this basename: AMMONIA,  in the last 168 hours CBC:  Recent Labs Lab 01/24/14 0418 01/25/14 0400 01/26/14 1446  WBC 5.5 6.4 9.5  HGB 11.2* 11.0* 13.4  HCT 34.4* 32.8* 40.6  MCV 90.3 88.4 90.2  PLT 215 247 284   Cardiac Enzymes: No results found for this basename: CKTOTAL, CKMB, CKMBINDEX,  TROPONINI,  in the last 168 hours BNP: No components found with this basename: POCBNP,  CBG:  Recent Labs Lab 01/29/14 0747 01/29/14 1210 01/29/14 1718 01/29/14 2214 01/30/14 0746  GLUCAP 97 148* 196* 139* 148*    No results found for this or any previous visit (from the past 240 hour(s)).   Scheduled Meds: . ALPRAZolam  1 mg Oral QHS  . atorvastatin  80 mg Oral QHS  . enoxaparin (LOVENOX) injection  40 mg Subcutaneous Q24H  . insulin aspart  0-9 Units Subcutaneous TID WC  . lisinopril  2.5 mg Oral q morning - 10a  . pantoprazole  40 mg Oral Daily  . triamterene-hydrochlorothiazide  1 tablet Oral q morning - 10a

## 2014-01-30 NOTE — Progress Notes (Signed)
CARE MANAGEMENT NOTE 01/30/2014  Patient:  Karen Arias, Karen Arias   Account Number:  [id number]  Date Initiated:  01/24/2014  Documentation initiated by:  Leafy Kindle  Subjective/Objective Assessment:   Pt admitted with abd pain     Action/Plan:   from home alone   Anticipated DC Date:  01/31/2014   Anticipated DC Plan:  [place]  CM consult      Choice offered to / List presented to:  C-1 Patient           Status of service:  Completed, signed off Medicare Important Message given?  YES (If response is "NO", the following Medicare IM given date fields will be blank) Date Medicare IM given:  01/24/2014 Medicare IM given by:  Leafy Kindle Date Additional Medicare IM given:  01/30/2014 Additional Medicare IM given by:  Jonnie Finner  Discharge Disposition:  [place]  Per UR Regulation:  Reviewed for med. necessity/level of care/duration of stay  If discussed at [place] of Stay Meetings, dates discussed:   01/20/2014  01/25/2014    Comments:  01/30/2014 1230 NCM spoke to dtr, Pam and plan is for SNF-rehab. Jonnie Finner RN CCM Case Mgmt phone [phone]   01/24/14 Marney Doctor Rn,BSN,NCM Pt from home alone but family checks in on her often.  PT is recommending HHPT.  Met with pt and daughter.  Pt states that she would like to use [facility] for her [place] because some of her family has used them in the past.  Daughter was concerned about pt going home with the new ostomy. Daughter and Pt were informed that a HHRN could help reinforce ostomy teaching in the home at DC.  [facility] rep called to inform of referral.  Will need MD orders for [facility] and PT at DC.  CM will continue to follow for DC needs.

## 2014-01-31 LAB — GLUCOSE, CAPILLARY
Glucose-Capillary: 134 mg/dL — ABNORMAL HIGH (ref 70–99)
Glucose-Capillary: 169 mg/dL — ABNORMAL HIGH (ref 70–99)
Glucose-Capillary: 126 mg/dL — ABNORMAL HIGH (ref 70–99)
Glucose-Capillary: 129 mg/dL — ABNORMAL HIGH (ref 70–99)

## 2014-01-31 NOTE — Progress Notes (Signed)
CSW continuing to follow for disposition planning.  CSW received return phone call from pt daughter, Newt Lukes. Per pt daughter, pt and pt family agreeable to [facility] for pt rehab.   CSW contacted [facility] Rehab and notified facility of acceptance of bed offer. [place] [facility] planned to initiate insurance authorization through [facility]. CSW sent updated clinicals to [place] to provide to [facility].   CSW to facilitate pt discharge needs when pt medically ready for discharge and insurance authorization received through [facility]. Per MD, anticipate discharge in the next 24 hours.  CSW to continue to follow.  Alison Murray, MSW, [place] Work [phone]

## 2014-01-31 NOTE — Care Management Note (Signed)
CARE MANAGEMENT NOTE 01/31/2014  Patient:  Karen Arias, Karen Arias   Account Number:  [id number]  Date Initiated:  01/24/2014  Documentation initiated by:  Leafy Kindle  Subjective/Objective Assessment:   Pt admitted with abd pain     Action/Plan:   from home alone   Anticipated DC Date:  01/31/2014   Anticipated DC Plan:  [place]  CM consult      Choice offered to / List presented to:  C-1 Patient           Status of service:  Completed, signed off Medicare Important Message given?  YES (If response is "NO", the following Medicare IM given date fields will be blank) Date Medicare IM given:  01/24/2014 Medicare IM given by:  Leafy Kindle Date Additional Medicare IM given:  01/31/2014 Additional Medicare IM given by:  Leafy Kindle  Discharge Disposition:  [place]  Per UR Regulation:  Reviewed for med. necessity/level of care/duration of stay  If discussed at [place] of Stay Meetings, dates discussed:   01/20/2014  01/25/2014    Comments:  01/31/14 Marney Doctor RN,BSN,NCM Awaiting Snf placement. [place] notified that [facility] services not needed.  No other DC needs noted at this time  01/30/2014 1230 NCM spoke to dtr, Pam and plan is for SNF-rehab. Jonnie Finner RN CCM Case Mgmt phone [phone]   01/24/14 Marney Doctor Rn,BSN,NCM Pt from home alone but family checks in on her often.  PT is recommending HHPT.  Met with pt and daughter.  Pt states that she would like to use [facility] for her [place] because some of her family has used them in the past.  Daughter was concerned about pt going home with the new ostomy. Daughter and Pt were informed that a HHRN could help reinforce ostomy teaching in the home at DC.  [facility] rep called to inform of referral.  Will need MD orders for [facility] and PT at DC.  CM will continue to follow for DC needs.

## 2014-01-31 NOTE — Progress Notes (Signed)
CSW continuing to follow.  CSW met with pt and pt husband at bedside.   CSW discussed that [facility] is the only bed offer for SNF at this time. Pt husband request that CSW contacted pt daughter, Jeannene Patella regarding rehab at [facility]. Pt husband states that he feels that pt will definitely need some rehab prior to returning home, but pt daughter, Jeannene Patella will be able to help with the decision.   CSW contacted pt daughter, Jeannene Patella via telephone. CSW notified pt daughter that pt only bed offer at this time is [facility]. Pt daughter stated that she is about to come to hospital and will visit Ritta Slot on the way to the hospital and notify this CSW upon arrival to the hospital if pt and pt family are agreeable to [place] discussed that pt insurance requires authorization prior to pt transition to SNF therefore decision will be needed as soon as possible. Pt daughter expressed understanding and will contact this CSW as soon as she arrives to the hospital.   CSW to continue to follow and facilitate pt discharge needs when pt medically ready for discharge and insurance authorization received.   Alison Murray, MSW, [place] [employer] [phone]

## 2014-01-31 NOTE — Progress Notes (Signed)
Patient ID: Karen Arias, female   DOB: 06-07-1928, 80 y.o.   MRN: [id number]     [address] [place], [place] [address]    Phone: [phone] FAX: [phone]     Subjective: Not asking for pain meds, sore, discussed regimen and encouraged to ask if needed.  Little mobilization.  Tolerating diet.  Colostomy working.    Objective:  Vital signs:  Filed Vitals:   01/30/14 0459 01/30/14 1354 01/30/14 2034 01/31/14 0549  BP: 138/76 123/76 128/78 152/63  Pulse: 94 94 86 100  Temp: 99.5 F (37.5 C) 97.4 F (36.3 C) 98.1 F (36.7 C) 98.9 F (37.2 C)  TempSrc: Oral Axillary Oral Oral  Resp: 16 16 16 16   Height:      Weight: 127 lb 6.4 oz (57.788 kg)     SpO2: 98% 98% 100% 97%    Last BM Date: 01/30/14  Intake/Output   Yesterday:  09/20 0701 - 09/21 0700 In: 220 [P.O.:220] Out: 1050 [Urine:900; Stool:150] This shift: I/O last 3 completed shifts: In: 320 [P.O.:320] Out: 1900 [Urine:1750; Stool:150]    Physical Exam: General: Pt awake/alert/oriented x4 in no acute distress Abdomen: Soft.  Nondistended. Appropriately tender.  Small midline incision, minimal fibrinous exudate.  RLQ stoma is pink and viable, solid stool.  LLQ mucus fistula, stoma is pink and viable, no output.  Directly beneath is the old ostomy site, wound is clean, replaced all packing.  No evidence of peritonitis.  No incarcerated hernias.   Problem List:   Principal Problem:   Colonic obstruction secondary to pancreatic mass status post diverting colostomy Active Problems:   Essential hypertension, benign   Diabetes mellitus type 2 in nonobese   Abdominal pain   Nonspecific (abnormal) findings on radiological and other examination of gastrointestinal tract   Acute kidney injury   Urinary retention    Results:   Labs: Results for orders placed during the hospital encounter of 01/14/14 (from the past 48 hour(s))  GLUCOSE,  CAPILLARY     Status: Abnormal   Collection Time    01/29/14 12:10 PM      Result Value Ref Range   Glucose-Capillary 148 (*) 70 - 99 mg/dL  GLUCOSE, CAPILLARY     Status: Abnormal   Collection Time    01/29/14  5:18 PM      Result Value Ref Range   Glucose-Capillary 196 (*) 70 - 99 mg/dL   Comment 1 Documented in Chart     Comment 2 Notify RN    GLUCOSE, CAPILLARY     Status: Abnormal   Collection Time    01/29/14 10:14 PM      Result Value Ref Range   Glucose-Capillary 139 (*) 70 - 99 mg/dL   Comment 1 Notify RN    GLUCOSE, CAPILLARY     Status: Abnormal   Collection Time    01/30/14  7:46 AM      Result Value Ref Range   Glucose-Capillary 148 (*) 70 - 99 mg/dL  GLUCOSE, CAPILLARY     Status: Abnormal   Collection Time    01/30/14 11:30 AM      Result Value Ref Range   Glucose-Capillary 195 (*) 70 - 99 mg/dL  GLUCOSE, CAPILLARY     Status: Abnormal   Collection Time    01/30/14  5:35 PM      Result Value Ref Range   Glucose-Capillary 214 (*) 70 -  99 mg/dL  GLUCOSE, CAPILLARY     Status: Abnormal   Collection Time    01/30/14  8:38 PM      Result Value Ref Range   Glucose-Capillary 111 (*) 70 - 99 mg/dL   Comment 1 Notify RN      Imaging / Studies: No results found.  Medications / Allergies:  Scheduled Meds: . ALPRAZolam  1 mg Oral QHS  . atorvastatin  80 mg Oral QHS  . enoxaparin (LOVENOX) injection  40 mg Subcutaneous Q24H  . insulin aspart  0-9 Units Subcutaneous TID WC  . lisinopril  2.5 mg Oral q morning - 10a  . pantoprazole  40 mg Oral Daily  . triamterene-hydrochlorothiazide  1 tablet Oral q morning - 10a   Continuous Infusions:  PRN Meds:.hydrALAZINE, HYDROmorphone (DILAUDID) injection, LORazepam, ondansetron (ZOFRAN) IV, oxyCODONE-acetaminophen, promethazine, promethazine  Antibiotics: Anti-infectives   Start     Dose/Rate Route Frequency Ordered Stop   01/27/14 0600  [MAR Hold]  cefoTEtan (CEFOTAN) 2 g in dextrose 5 % 50 mL IVPB     (On MAR Hold  since 01/26/14 1454)   2 g 100 mL/hr over 30 Minutes Intravenous On call to O.R. 01/26/14 1425 01/26/14 1530   01/20/14 0600  [MAR Hold]  cefoTEtan (CEFOTAN) 2 g in dextrose 5 % 50 mL IVPB     (On MAR Hold since 01/20/14 1244)  Comments:  Pharmacy may adjust dose strength for optimal dosing.   Send with patient on call to the OR.  Anesthesia to complete antibiotic administration <56min prior to incision per [facility].   2 g 100 mL/hr over 30 Minutes Intravenous On call to O.R. 01/19/14 1548 01/20/14 1325        Assessment/Plan Obstructing pancreatic mass POD#5 exploratory laparotomy, colostomy, excision of mucus fistula and end colostomy---Dr. Rosendo Gros 01/26/14 -Tolerating diet, pain control, some mobilization, stable for DC from surgical standpoint i will arrange her follow up -[facility] for dressing changes/ostomy care -WOC on board for ostomy teaching, getting a seal on fistula may be difficult with the location of previous ostomy site, but not putting out much.  -BID wet to dry dressing changes-clean  Erby Pian, ANP-BC [facility] Pager [phone](7A-4:30P) For consults and floor pages call [phone](7A-4:30P)  01/31/2014 8:23 AM

## 2014-01-31 NOTE — Progress Notes (Signed)
Karen Arias   DOB:Oct 29, 1928   YQ#:[id number]    Subjective: She is not doing well. I was requested to return to talk with the patient about goals of therapy. She had repeat surgery last week due to her persistent bowel obstruction. She continues to have persistent abdominal pain. Appetite is very poor and the patient is almost completely bedbound  Objective:  Filed Vitals:   01/31/14 1431  BP: 105/74  Pulse: 85  Temp: 98 F (36.7 C)  Resp: 16     Intake/Output Summary (Last 24 hours) at 01/31/14 2017 Last data filed at 01/31/14 1737  Gross per 24 hour  Intake    480 ml  Output    650 ml  Net   -170 ml    GENERAL:alert, no distress and comfortable SKIN: skin color, texture, turgor are normal, no rashes or significant lesions EYES: normal, Conjunctiva are pink and non-injected, sclera clear ABDOMEN:abdomen soft, mild tenderness on the left upper quadrant and normal bowel sounds Musculoskeletal:no cyanosis of digits and no clubbing  NEURO: alert & oriented x 3 with fluent speech, no focal motor/sensory deficits   Labs:  Lab Results  Component Value Date   WBC 9.5 01/26/2014   HGB 13.4 01/26/2014   HCT 40.6 01/26/2014   MCV 90.2 01/26/2014   PLT 284 01/26/2014   NEUTROABS 4.5 01/14/2014    Lab Results  Component Value Date   NA 131* 01/26/2014   K 4.7 01/26/2014   CL 93* 01/26/2014   CO2 22 01/26/2014    Assessment & Plan:  #1 abdominal pain, nausea and constipation, consistent with bowel obstruction from extrinsic compression of colon status post divergent colostomy #2 pancreatic mass extending to the colon with elevated CA 19-9, likely locally advanced pancreatic cancer until proven otherwise  #3 anorexia with recent weight loss  I discussed with her and her family about the prognosis in general regarding the role of palliative chemotherapy. She wants to get chemotherapy after surgery.  However, since I last saw her, she has a progressive decline. With her poor  performance status, at best, even if she recovers to a reasonable state by next month, I would imagine she can only tolerate single agent gemcitabine. Based on the report from CONKO-001 trial, estimated gain in overall survival with single agent gemcitabine is only 3 months, meaning from 6 months without systemic chemotherapy to 9 months with chemotherapy. I recommend the patient and her daughter to consider palliative care and hospice. I will move her appointment from this week to 3 weeks from now. I discussed with her briefly about serious consideration for advanced directives and goals of therapy.  [facility], [place] Rose[place]ar, MD 01/31/2014  8:17 PM

## 2014-01-31 NOTE — Progress Notes (Addendum)
Patient ID: Karen Arias, female   DOB: [birth year], 80 y.o.   MRN: [id number] [facility] PROGRESS NOTE  Karen Arias LZJ:[id number] DOB: 1929/01/12 DOA: 01/14/2014 PCP: Karen Jacobson, MD  Brief narrative: 80 y.o. female with a PMH of hypertension, hiatal hernia (EGD in 09/2012) who followed with Karen Arias for GI related issues who was admitted 01/14/14 with chief complaint of abdominal pain and constipation. She was found to have LUQ soft tissue mass (5.7 cm in diameter) on CT abdomen causing obstruction of the colon at the level of the splenic flexure, worrisome for malignancy. Her tumor marker CA 19-9 elevated, concerning for pancreatic cancer. Colonoscopy prep attempted but patient could not tolerate well. Underwent a flexible sigmoidoscopy 01/18/14 which showed stenosis of the colon at the splenic flexure with extrinsic compression. Underwent diverting colostomy 01/20/14. 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 She subsequently underwent exploratory laparotomy, colostomy, excision of colonic fistula and end colostomy 01/26/2014.   Assessment/Plan:   Principal Problem:  Left upper quadrant mass causing bowel obstruction, probable pancreatic cancer status post diverting colostomy  Patient was found to have left upper quadrant mass on admission worrisome for pancreatic cancer. CEA WNL. CA 19-9 3434. CA-125 - 64.  Patient is status post the following procedures and/or operations: 1. Flexible sigmoidoscopy 01/18/14 showing stenosis of the colon at the splenic flexure with extrinsic compression (unable to tolerate prep for full colonoscopy); 2. Diverting colostomy 01/20/14. Ostomy nurse following for education of ostomy care; 3. Exploratory laparotomy, colostomy, excision of colonic fistula and end colostomy 01/26/2014. THis last operation was because pt more abdominal distention and pain. She had CT abdomen 01/26/2014 which showed colonic obstruction with  distention of the right colon due ot distal transverse colonic herniation at the colostomy site. Plan for palliative chemo once medically stable (per Dr. [name] of oncology). I appreciate oncology revisiting the issue since patient seems to be weaker since second surgery. Maybe chemotherapy can be put on hold. I discussed this with the patient's family who were in agreement to hold off until patient gets low stronger. Plan is for discharge to skilled nursing facility likely in next 24 hours and patient and her family are all in agreement with this discharge plan. Patient is encouraged to eat and ambulate as tolerated.  Active Problems:  Hypokalemia  Supplemented. Marland Kitchen Dyslipidemia   Continue statin therapy Postoperative urinary retention  Resolved, Foley successfully discontinued 01/22/14 after a voiding trial.  Acute renal failure  Creatinine elevation noted 01/19/14. Possibly secondary to contrast nephropathy. BP medications including lisinopril/Maxide were held and the patient has been gently hydrated. KVO IVF.  Creatinine returned to baseline by 01/23/14 so Lisinopril and Maxzide restarted. Essential hypertension  Continue Maxzide and Lisinopril.  Diabetes mellitus   Patient is currently on sliding scale insulin. DVT Prophylaxis  Continue Lovenox while pt is in hospital.    Code Status: Full.  Family Communication: Family at the bedside this am.  Disposition Plan: To skilled nursing facility likely in next 24 hours.   IV Access:   Peripheral IV  Port-A-Cath placed 01/20/14 Procedures and diagnostic studies:   Ct Abdomen Pelvis W Contrast 01/14/2014 Left upper quadrant soft tissue mass causing obstruction of the colon at the level of the splenic flexure. The mass measures approximately 5.7 cm in greatest dimensions. Exact origin of the tumor is not entirely clear by CT. 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 Second most likely  origin would be  the colon. No associated colonic perforation is identified by CT. No associated enlarged lymph nodes or distant metastatic lesions are identified in the abdomen or pelvis.  Dg Abd Acute W/chest 01/14/2014 Negative abdominal radiographs. No acute cardiopulmonary disease.  Dg Abd 2 Views 01/15/2014 : No evidence of small bowel obstruction or free air.  Dg Abd 01/16/2014: No evidence of bowel obstruction.  Flexible sigmoidoscopy 01/18/14: The colonoscope was advanced to approximately the region of the splenic flexure. There was stenosis of the colon with hyperemic mucosa suggesting extrinsic compression. The scope would not pass beyond. No obvious primary mucosal lesion. Left-sided diverticulosis present.  Laparoscopic colostomy and insertion of Port-A-Cath right internal jugular done by Karen Arias 08/18/12: No complications noted.  Dg Abd Portable 1v 01/25/2014: Colonic distention has developed since the prior study consistent with a partial colonic obstruction at the level of the splenic flexure.  Ct Abdomen Pelvis W Contrast 01/26/2014 1. There is colonic obstruction with distension of the right colon and transverse colon due to incarcerated distal transverse colonic herniation at the colostomy site in left lower abdomen anteriorly. Best seen in coronal image 14. 2. The previous mass in left upper abdomen has been removed. There are postsurgical changes in left upper abdomen and small amount of perisplenic ascites. 3. Degenerative changes lumbar spine. 4. No hydronephrosis or hydroureter. 5. Distended urinary bladder. Retroflexed uterus. Small amount of pelvic free fluid within posterior pelvis.  Exploratory Laparotomy, colostomy, excision of colonic fistula and end colostomy 01/26/2014 Medical Consultants:   Karen Arias, Oncology.  Karen Arias, GI.  Karen Arias, Surgery. Other Consultants:   Physical therapy Anti-Infectives:   None.    Karen Lenz, MD  [facility] Pager  [phone]  If 7PM-7AM, please contact night-coverage [email or web address] Password [email or web address] 01/31/2014, 11:30 AM   LOS: 17 days    HPI/Subjective: No acute overnight events.  Objective: Filed Vitals:   01/30/14 0459 01/30/14 1354 01/30/14 2034 01/31/14 0549  BP: 138/76 123/76 128/78 152/63  Pulse: 94 94 86 100  Temp: 99.5 F (37.5 C) 97.4 F (36.3 C) 98.1 F (36.7 C) 98.9 F (37.2 C)  TempSrc: Oral Axillary Oral Oral  Resp: 16 16 16 16   Height:      Weight: 57.788 kg (127 lb 6.4 oz)     SpO2: 98% 98% 100% 97%    Intake/Output Summary (Last 24 hours) at 01/31/14 1130 Last data filed at 01/31/14 1497  Gross per 24 hour  Intake    120 ml  Output   1050 ml  Net   -930 ml    Exam:   General:  Pt is alert, no distress  Cardiovascular: Regular rate and rhythm, S1/S2 appreciated  Respiratory: bilateral air entry, no wheezing, no crackles, no rhonchi  Abdomen: Soft, non tender, non distended, bowel sounds present; left and right colostomy in place.  Extremities: Pulses DP and PT palpable bilaterally  Neuro: No focal neurological deficits.  Data Reviewed: Basic Metabolic Panel:  Recent Labs Lab 01/25/14 0400 01/26/14 1446  NA 135* 131*  K 4.7 4.7  CL 99 93*  CO2 23 22  GLUCOSE 109* 104*  BUN 12 10  CREATININE 0.75 0.88  CALCIUM 9.4 10.9*   Liver Function Tests: No results found for this basename: AST, ALT, ALKPHOS, BILITOT, PROT, ALBUMIN,  in the last 168 hours No results found for this basename: LIPASE, AMYLASE,  in the last 168 hours No results found for this basename: AMMONIA,  in the  last 168 hours CBC:  Recent Labs Lab 01/25/14 0400 01/26/14 1446  WBC 6.4 9.5  HGB 11.0* 13.4  HCT 32.8* 40.6  MCV 88.4 90.2  PLT 247 284   Cardiac Enzymes: No results found for this basename: CKTOTAL, CKMB, CKMBINDEX, TROPONINI,  in the last 168 hours BNP: No components found with this basename: POCBNP,  CBG:  Recent Labs Lab 01/30/14 0746 01/30/14 1130  01/30/14 1735 01/30/14 2038 01/31/14 0759  GLUCAP 148* 195* 214* 111* 134*    No results found for this or any previous visit (from the past 240 hour(s)).   Scheduled Meds: . ALPRAZolam  1 mg Oral QHS  . atorvastatin  80 mg Oral QHS  . enoxaparin (LOVENOX) injection  40 mg Subcutaneous Q24H  . insulin aspart  0-9 Units Subcutaneous TID WC  . lisinopril  2.5 mg Oral q morning - 10a  . pantoprazole  40 mg Oral Daily  . triamterene-hydrochlorothiazide  1 tablet Oral q morning - 10a

## 2014-01-31 NOTE — Progress Notes (Signed)
Physical Therapy Treatment Patient Details Name: Karen Arias MRN: [id number] DOB: Mar 07, 1929 Today's Date: 01/31/2014    History of Present Illness 80 yo female s/p lap colostomy, pancreatic Cancer.  pt with ex lap and end colostomy on 01/26/14.     PT Comments    Pt requiring increased time to process and perform tasks today.  Pt also requiring more assist.   Recommend SNF upon d/c.  Follow Up Recommendations  Supervision/Assistance - 24 hour;SNF     Equipment Recommendations  Rolling walker with 5" wheels    Recommendations for Other Services       Precautions / Restrictions Precautions Precautions: Fall Precaution Comments: adominal surgery; colostomy    Mobility  Bed Mobility Overal bed mobility: Needs Assistance Bed Mobility: Rolling;Sidelying to Sit Rolling: Mod assist Sidelying to sit: HOB elevated;Mod assist       General bed mobility comments: verbal cues for log roll technique for abdominal pain control, increased time to process and perform  Transfers Overall transfer level: Needs assistance Equipment used: Rolling walker (2 wheeled) Transfers: Sit to/from Stand Sit to Stand: Mod assist         General transfer comment: assist for rise and steadying as well as controlling descent, multimodal cues  Ambulation/Gait Ambulation/Gait assistance: Mod assist Ambulation Distance (Feet): 14 Feet Assistive device: Rolling walker (2 wheeled) Gait Pattern/deviations: Step-to pattern;Narrow base of support;Shuffle;Trunk flexed Gait velocity: decr   General Gait Details: slow gait speed, manual assist to forward propulsion as pt tended to march in place despite all other cues, leading with L LE and leaving behind R LE today with increased cues to correct, RN followed with recliner   Stairs            Wheelchair Mobility    Modified Rankin (Stroke Patients Only)       Balance                                    Cognition  Arousal/Alertness: Awake/alert Behavior During Therapy: WFL for tasks assessed/performed Overall Cognitive Status: Impaired/Different from baseline Area of Impairment: Following commands     Memory: Decreased short-term memory Following Commands: Follows one step commands with increased time;Follows one step commands inconsistently       General Comments: pt appears slow to process today and requiring increased time to perform as well    Exercises      General Comments        Pertinent Vitals/Pain Pain Assessment: 0-10 Pain Score: 7  Pain Location: abdomen Pain Descriptors / Indicators: Sore Pain Intervention(s): Monitored during session;RN gave pain meds during session;Repositioned    Home Living                      Prior Function            PT Goals (current goals can now be found in the care plan section) Progress towards PT goals: Progressing toward goals    Frequency  Min 3X/week    PT Plan Current plan remains appropriate    Co-evaluation             End of Session Equipment Utilized During Treatment: Gait belt Activity Tolerance: Patient tolerated treatment well Patient left: in chair;with call bell/phone within reach;with nursing/sitter in room     Time: 1040-1103 PT Time Calculation (min): 23 min  Charges:  $Gait Training: 8-22 mins $Therapeutic Activity: 8-22  mins                    G Codes:      Karen Arias,Karen Arias 02/04/2014, 12:34 PM Karen Arias, PT, DPT 2014-02-04 Pager: [phone]

## 2014-02-01 ENCOUNTER — Telehealth: Payer: Self-pay | Admitting: Hematology and Oncology

## 2014-02-01 LAB — MAGNESIUM: Magnesium: 1.3 mg/dL — ABNORMAL LOW (ref 1.5–2.5)

## 2014-02-01 LAB — CBC
HCT: 30.1 % — ABNORMAL LOW (ref 36.0–46.0)
Hemoglobin: 10.4 g/dL — ABNORMAL LOW (ref 12.0–15.0)
MCH: 29.5 pg (ref 26.0–34.0)
MCHC: 34.6 g/dL (ref 30.0–36.0)
MCV: 85.5 fL (ref 78.0–100.0)
Platelets: 268 10*3/uL (ref 150–400)
RBC: 3.52 MIL/uL — ABNORMAL LOW (ref 3.87–5.11)
RDW: 13.4 % (ref 11.5–15.5)
WBC: 13.1 10*3/uL — ABNORMAL HIGH (ref 4.0–10.5)

## 2014-02-01 LAB — BASIC METABOLIC PANEL
Anion gap: 9 (ref 5–15)
BUN: 8 mg/dL (ref 6–23)
CO2: 27 mEq/L (ref 19–32)
Calcium: 8.9 mg/dL (ref 8.4–10.5)
Chloride: 98 mEq/L (ref 96–112)
Creatinine, Ser: 0.88 mg/dL (ref 0.50–1.10)
GFR calc Af Amer: 69 mL/min — ABNORMAL LOW (ref >90)
GFR calc non Af Amer: 59 mL/min — ABNORMAL LOW (ref >90)
Glucose, Bld: 161 mg/dL — ABNORMAL HIGH (ref 70–99)
Potassium: 3.5 mEq/L — ABNORMAL LOW (ref 3.7–5.3)
Sodium: 134 mEq/L — ABNORMAL LOW (ref 137–147)

## 2014-02-01 LAB — GLUCOSE, CAPILLARY
Glucose-Capillary: 129 mg/dL — ABNORMAL HIGH (ref 70–99)
Glucose-Capillary: 168 mg/dL — ABNORMAL HIGH (ref 70–99)

## 2014-02-01 MED ORDER — POTASSIUM CHLORIDE CRYS ER 20 MEQ PO TBCR
20.0000 meq | EXTENDED_RELEASE_TABLET | Freq: Once | ORAL | Status: AC
  Administered 2014-02-01: 20 meq via ORAL
  Filled 2014-02-01: qty 1

## 2014-02-01 MED ORDER — MAGNESIUM SULFATE 4000MG/100ML IJ SOLN
4.0000 g | Freq: Once | INTRAMUSCULAR | Status: AC
  Administered 2014-02-01: 4 g via INTRAVENOUS
  Filled 2014-02-01: qty 100

## 2014-02-01 MED ORDER — PROMETHAZINE HCL 12.5 MG PO TABS: 12.5000 mg | ORAL_TABLET | Freq: Four times a day (QID) | ORAL | Status: AC | PRN

## 2014-02-01 MED ORDER — ALPRAZOLAM 0.5 MG PO TABS: 1.0000 mg | ORAL_TABLET | Freq: Every day | ORAL | Status: DC

## 2014-02-01 MED ORDER — OXYCODONE-ACETAMINOPHEN 5-325 MG PO TABS: 1.0000 | ORAL_TABLET | ORAL | Status: DC | PRN

## 2014-02-01 MED ORDER — INSULIN ASPART 100 UNIT/ML ~~LOC~~ SOLN: 0.0000 [IU] | Freq: Three times a day (TID) | SUBCUTANEOUS | Status: DC

## 2014-02-01 NOTE — Discharge Summary (Signed)
Physician Discharge Summary  Karen Arias UVO:[id number] DOB: June 23, 1928 DOA: 01/14/2014  PCP: [name], MD  Admit date: 01/14/2014 Discharge date: 02/01/2014  Recommendations for Outpatient Follow-up:  1. Check CBC and BMP per skilled nursing facility protocol. 2. Followup in cancer Center in 3 weeks from discharge for possible palliative chemotherapy.  Discharge Diagnoses:  Principal Problem:   Colonic obstruction secondary to pancreatic mass status post diverting colostomy Active Problems:   Essential hypertension, benign   Diabetes mellitus type 2 in nonobese   Abdominal pain   Nonspecific (abnormal) findings on radiological and other examination of gastrointestinal tract   Acute kidney injury   Urinary retention    Discharge Condition: stable   Diet recommendation: as tolerated   History of present illness:  80 y.o. female with a PMH of hypertension, hiatal hernia (EGD in 09/2012) who followed with Dr. [name] for GI related issues who was admitted 01/14/14 with chief complaint of abdominal pain and constipation. She was found to have LUQ soft tissue mass (5.7 cm in diameter) on CT abdomen causing obstruction of the colon at the level of the splenic flexure, worrisome for malignancy. Her tumor marker CA 19-9 elevated, concerning for pancreatic cancer. Colonoscopy prep attempted but patient could not tolerate well. Underwent a flexible sigmoidoscopy 01/18/14 which showed stenosis of the colon at the splenic flexure with extrinsic compression. Underwent diverting colostomy 01/20/14. 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 She subsequently underwent exploratory laparotomy, colostomy, excision of colonic fistula and end colostomy 01/26/2014.   Assessment/Plan:   Principal Problem:  Left upper quadrant mass causing bowel obstruction, probable pancreatic cancer status post diverting colostomy  Patient was found to have left upper quadrant mass  on admission worrisome for pancreatic cancer. CEA WNL. CA 19-9 3434. CA-125 - 64.  Patient is status post the following procedures and/or operations: 1. Flexible sigmoidoscopy 01/18/14 showing stenosis of the colon at the splenic flexure with extrinsic compression (unable to tolerate prep for full colonoscopy); 2. Diverting colostomy 01/20/14. Ostomy nurse following for education of ostomy care; 3. Exploratory laparotomy, colostomy, excision of colonic fistula and end colostomy 01/26/2014. THis last operation was because pt more abdominal distention and pain. She had CT abdomen 01/26/2014 which showed colonic obstruction with distention of the right colon due ot distal transverse colonic herniation at the colostomy site. Plan for palliative chemo once medically stable. Patient will followup in [place] in 3 weeks from discharge with Dr. [name] for possible palliative chemotherapy. Patient is medically stable to be discharged to skilled nursing facility. Patient and her family are in agreement with this discharge plan.  Active Problems:  Hypokalemia  Supplemented. Marland Kitchen Dyslipidemia  Continue statin therapy Postoperative urinary retention  Resolved, Foley successfully discontinued 01/22/14 after a voiding trial.  Acute renal failure  Creatinine elevation noted 01/19/14. Possibly secondary to contrast nephropathy. BP medications including lisinopril/Maxide were held and the patient has been gently hydrated. KVO IVF.  Creatinine returned to baseline by 01/23/14 so Lisinopril and Maxzide restarted. Essential hypertension  Continue Maxzide and Lisinopril.  Diabetes mellitus  Patient is currently on sliding scale insulin. DVT Prophylaxis  Continue Lovenox while pt is in hospital.    Code Status: Full.  Family Communication: Family at the bedside this am.   IV Access:   Peripheral IV  Port-A-Cath placed 01/20/14 Procedures and diagnostic studies:   Ct Abdomen Pelvis W Contrast 01/14/2014 Left upper  quadrant soft tissue mass causing obstruction of the colon at  the level of the splenic flexure. The mass measures approximately 5.7 cm in greatest dimensions. Exact origin of the tumor is not entirely clear by CT. 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 Second most likely origin would be the colon. No associated colonic perforation is identified by CT. No associated enlarged lymph nodes or distant metastatic lesions are identified in the abdomen or pelvis.  Dg Abd Acute W/chest 01/14/2014 Negative abdominal radiographs. No acute cardiopulmonary disease.  Dg Abd 2 Views 01/15/2014 : No evidence of small bowel obstruction or free air.  Dg Abd 01/16/2014: No evidence of bowel obstruction.  Flexible sigmoidoscopy 01/18/14: The colonoscope was advanced to approximately the region of the splenic flexure. There was stenosis of the colon with hyperemic mucosa suggesting extrinsic compression. The scope would not pass beyond. No obvious primary mucosal lesion. Left-sided diverticulosis present.  Laparoscopic colostomy and insertion of Port-A-Cath right internal jugular done by Dr. Excell Seltzer 0/10/27: No complications noted.  Dg Abd Portable 1v 01/25/2014: Colonic distention has developed since the prior study consistent with a partial colonic obstruction at the level of the splenic flexure.  Ct Abdomen Pelvis W Contrast 01/26/2014 1. There is colonic obstruction with distension of the right colon and transverse colon due to incarcerated distal transverse colonic herniation at the colostomy site in left lower abdomen anteriorly. Best seen in coronal image 14. 2. The previous mass in left upper abdomen has been removed. There are postsurgical changes in left upper abdomen and small amount of perisplenic ascites. 3. Degenerative changes lumbar spine. 4. No hydronephrosis or hydroureter. 5. Distended urinary bladder. Retroflexed uterus. Small amount of pelvic free fluid within posterior  pelvis.  Exploratory Laparotomy, colostomy, excision of colonic fistula and end colostomy 01/26/2014 Medical Consultants:   Dr. Heath Lark, Oncology.  Dr. Scarlette Shorts, GI.  Dr. Excell Seltzer, Surgery. Other Consultants:   Physical therapy Anti-Infectives:   None.  SignedLeisa Lenz, MD  [facility] 02/01/2014, 11:33 AM  Pager #: [phone]    Discharge Exam: Filed Vitals:   02/01/14 0434  BP: 105/54  Pulse: 92  Temp: 98.4 F (36.9 C)  Resp: 16   Filed Vitals:   01/31/14 2027 01/31/14 2040 02/01/14 0210 02/01/14 0434  BP: 120/65   105/54  Pulse: 89 78 106 92  Temp: 98.7 F (37.1 C)   98.4 F (36.9 C)  TempSrc: Oral   Oral  Resp: 16   16  Height:      Weight:      SpO2: 99%   99%    General: Pt is alert, follows commands appropriately, not in acute distress Cardiovascular: Regular rate and rhythm, S1/S2 +, no murmurs Respiratory: Clear to auscultation bilaterally, no wheezing, no crackles, no rhonchi Abdominal: Soft, nontender, 2 colostomies in place. Extremities: no edema, no cyanosis, pulses palpable bilaterally DP and PT Neuro: Grossly nonfocal  Discharge Instructions  Discharge Instructions   Call MD for:  difficulty breathing, headache or visual disturbances    Complete by:  As directed      Call MD for:  persistant dizziness or light-headedness    Complete by:  As directed      Call MD for:  persistant nausea and vomiting    Complete by:  As directed      Call MD for:  severe uncontrolled pain    Complete by:  As directed      Diet - low sodium heart healthy    Complete by:  As directed      Discharge instructions    Complete by:  As directed   1. Encourage PO intake.  2. Follow up in cancer center in 3 weeks form discharge for possible palliative chemotherapy.     Increase activity slowly    Complete by:  As directed             Medication List    STOP taking these medications       aspirin EC 325 MG tablet      ibuprofen 200 MG tablet  Commonly known as:  ADVIL,MOTRIN     megestrol 40 MG/ML suspension  Commonly known as:  MEGACE     metFORMIN 500 MG tablet  Commonly known as:  GLUCOPHAGE     omeprazole 20 MG capsule  Commonly known as:  PRILOSEC      TAKE these medications       alendronate 70 MG tablet  Commonly known as:  FOSAMAX  Take 70 mg by mouth every 7 (seven) days. Take with a full glass of water on an empty stomach.  Taken on Sundays.     ALPRAZolam 0.5 MG tablet  Commonly known as:  XANAX  Take 2 tablets (1 mg total) by mouth at bedtime. anxiety     atorvastatin 80 MG tablet  Commonly known as:  LIPITOR  Take 80 mg by mouth at bedtime.     insulin aspart 100 UNIT/ML injection  Commonly known as:  novoLOG  Inject 0-9 Units into the skin 3 (three) times daily with meals.     lisinopril 2.5 MG tablet  Commonly known as:  PRINIVIL,ZESTRIL  Take 2.5 mg by mouth every morning.     OS-CAL 500 + D PO  Take 1 tablet by mouth at bedtime.     oxyCODONE-acetaminophen 5-325 MG per tablet  Commonly known as:  PERCOCET/ROXICET  Take 1-2 tablets by mouth every 4 (four) hours as needed for moderate pain.     promethazine 12.5 MG tablet  Commonly known as:  PHENERGAN  Take 1-2 tablets (12.5-25 mg total) by mouth every 6 (six) hours as needed for nausea.     triamterene-hydrochlorothiazide 75-50 MG per tablet  Commonly known as:  MAXZIDE  Take 1 tablet by mouth every morning.           Follow-up Information   Follow up with [facility]. (Home Health Physical Therapy, Occupational Therapy, RN and aide)    Contact information:   [address] [place] [address] [phone]       Follow up with Reyes Ivan, MD On 02/21/2014. (arrive by 11:20AM for a 11:40AM post op check)    Specialty:  General Surgery   Contact information:   [address] [place] [address] [phone]        The results of significant  diagnostics from this hospitalization (including imaging, microbiology, ancillary and laboratory) are listed below for reference.    Significant Diagnostic Studies: Ct Abdomen Pelvis W Contrast  01/26/2014   CLINICAL DATA:  Colonic obstructive, evaluate for blockage of colostomy.  EXAM: CT ABDOMEN AND PELVIS WITH CONTRAST  TECHNIQUE: Multidetector CT imaging of the abdomen and pelvis was performed using the standard protocol following bolus administration of intravenous contrast.  CONTRAST:  134mL OMNIPAQUE IOHEXOL 300 MG/ML  SOLN  COMPARISON:  01/14/2014  FINDINGS: Lung bases are unremarkable. Atherosclerotic calcifications of coronary arteries.  There is about 6 mm anterolisthesis L4 on L5 vertebral body.  Enhanced liver is unremarkable. No calcified gallstones are noted within gallbladder. Enhanced kidneys are symmetrical in size. No hydronephrosis or hydroureter. There is mild distension of the right colon and transverse colon with air-fluid level. Atherosclerotic calcifications of abdominal aorta and iliac arteries. No aortic aneurysm. The previous mass in left upper abdomen has been removed. Postsurgical changes are noted in the region of pancreatic tail and splenic hilum. Small amount of perisplenic ascites.  Delayed renal images shows bilateral renal symmetrical excretion.  There is a colostomy in left abdomen. There is herniation of distal transverse colon at the colostomy site with a bulging exophytic soft tissue appearance at the level of abdominal wall. Findings are consistent with acute colonic obstruction due to incarcerated colonic herniation at colostomy site.  Distended urinary bladder. There is a retroflexed uterus. Small amount of free fluid is noted within posterior pelvis.  IMPRESSION: 1. There is colonic obstruction with distension of the right colon and transverse colon due to incarcerated distal transverse colonic herniation at the colostomy site in left lower abdomen anteriorly. Best  seen in coronal image 14. 2. The previous mass in left upper abdomen has been removed. There are postsurgical changes in left upper abdomen and small amount of perisplenic ascites. 3. Degenerative changes lumbar spine. 4. No hydronephrosis or hydroureter. 5. Distended urinary bladder. Retroflexed uterus. Small amount of pelvic free fluid within posterior pelvis. Critical Value/emergent results were called by telephone at the time of interpretation on 01/26/2014 at 1:31 pm to Dr. Saverio Danker , who verbally acknowledged these results.   Electronically Signed   By: Lahoma Crocker M.D.   On: 01/26/2014 13:31   Ct Abdomen Pelvis W Contrast  01/14/2014   CLINICAL DATA:  Abdominal pain and constipation.  EXAM: CT ABDOMEN AND PELVIS WITH CONTRAST  TECHNIQUE: Multidetector CT imaging of the abdomen and pelvis was performed using the standard protocol following bolus administration of intravenous contrast.  CONTRAST:  41mL OMNIPAQUE IOHEXOL 300 MG/ML SOLN, 176mL OMNIPAQUE IOHEXOL 300 MG/ML SOLN  COMPARISON:  08/20/2012  FINDINGS: Visualized lower chest again demonstrates changes related to prior myocardial infarction with an apical ventricular aneurysm again identified.  The liver shows steatosis. No hepatic masses or biliary obstruction identified. The gallbladder is unremarkable.  The spleen, pancreas, adrenal glands and kidneys are unremarkable.  There is significant fecal material noted with colonic distention beginning at the level of the cecum and continuing to the level of the splenic flexure. At the level of the splenic flexure, there is caliber change of the colon with decompression of the descending colon and distal colon. Amorphous, lobulated soft tissue in this region measures roughly 5.1 x 5.3 x 5.7 cm in greatest dimensions and is very suspicious for an obstructing tumor. It is difficult to identify whether this tumor is originating from the tail of the pancreas or the colon. The lobulated soft tissue abuts the  tail of the pancreas, the posterior wall of the stomach, the colon and the splenic hilum. It also nearly abuts the left kidney. Tumor origin is suspected to be most likely the pancreatic tail followed by the colon. This was not present on the prior CT in appears to be an aggressive process.  No evidence of bowel perforation or abnormal fluid collection. No enlarged lymph nodes are seen. The bladder is distended. No hernias are seen. Bony structures are unremarkable.  IMPRESSION: Left upper quadrant soft tissue mass causing obstruction of the colon at the level of the splenic flexure. The mass measures approximately  5.7 cm in greatest dimensions. Exact origin of the tumor is not entirely clear by CT. 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 Second most likely origin would be the colon. No associated colonic perforation is identified by CT. No associated enlarged lymph nodes or distant metastatic lesions are identified in the abdomen or pelvis.  Critical Value/emergent results were called by telephone at the time of interpretation on 01/14/2014 at 5:55 pm to Dr. Tomi Bamberger , who verbally acknowledged these results.   Electronically Signed   By: Aletta Edouard M.D.   On: 01/14/2014 17:58   Dg Chest Port 1 View  01/20/2014   CLINICAL DATA:  Port-A-Cath placement.  EXAM: PORTABLE CHEST - 1 VIEW  COMPARISON:  01/14/2014  FINDINGS: Power port has been inserted. The tip is in the superior vena cava at the level of the carina. No pneumothorax. Lungs are clear except for minimal scarring in the left midzone. Slight cardiomegaly. Vascularity is normal.  IMPRESSION: Power port in good position.   Electronically Signed   By: Rozetta Nunnery M.D.   On: 01/20/2014 15:56   Dg Abd 2 Views  01/16/2014   CLINICAL DATA:  Abdominal pain and constipation. Partial colonic obstruction.  EXAM: ABDOMEN - 2 VIEW  COMPARISON:  One day prior  FINDINGS: Upright and supine views. The upright view  demonstrates no free intraperitoneal air or significant air-fluid levels. The supine view demonstrates contrast within normal caliber hepatic flexure and transverse colon. Small volume sigmoid gas identified. No significant small bowel distension.  IMPRESSION: Similar bowel gas pattern with contrast within normal caliber transverse colon. No evidence of bowel obstruction.   Electronically Signed   By: Abigail Miyamoto M.D.   On: 01/16/2014 13:00   Dg Abd 2 Views  01/15/2014   CLINICAL DATA:  Abdominal pain  EXAM: ABDOMEN - 2 VIEW  COMPARISON:  CT abdomen pelvis dated 01/14/2014  FINDINGS: Nonspecific but nonobstructive bowel gas pattern, without disproportionate small bowel dilatation to suggest small bowel obstruction.  Residual contrast within mildly prominent but nondilated loops of colon.  No evidence of free air under the diaphragm on the upright view.  Visualized osseous structures are within normal limits.  IMPRESSION: No evidence of small bowel obstruction or free air.   Electronically Signed   By: Julian Hy M.D.   On: 01/15/2014 08:27   Dg Abd Acute W/chest  01/14/2014   CLINICAL DATA:  Abdominal pain, nausea and vomiting  EXAM: ACUTE ABDOMEN SERIES (ABDOMEN 2 VIEW & CHEST 1 VIEW)  COMPARISON:  12/17/2011 similar exam  FINDINGS: Moderate enlargement of the cardiac silhouette is reidentified. The lungs are clear. No pleural effusion. Left lower lobe scarring is stable. No free air beneath the diaphragms.  Normal bowel gas pattern. No abnormal radiopacity. No acute osseous finding. Degenerative changes are noted at the symphysis pubis.  IMPRESSION: Negative abdominal radiographs.  No acute cardiopulmonary disease.   Electronically Signed   By: Conchita Paris M.D.   On: 01/14/2014 14:34   Dg Abd Portable 1v  01/25/2014   CLINICAL DATA:  Abdominal pain and distension.  EXAM: PORTABLE ABDOMEN - 1 VIEW  COMPARISON:  01/16/2014  FINDINGS: There is significant colonic distention developing since the  prior exam. This involves the transverse and right colon. Minimal air is seen in the rectum with a small amount of air projecting in the region of the descending colon. A faintly seen tube projects in the left upper quadrant.  No gross  free air on this supine study. Soft tissues are not well defined due to the overlying bowel gas.  IMPRESSION: Colonic distention has developed since the prior study consistent with a partial colonic obstruction at the level of the splenic flexure.   Electronically Signed   By: Lajean Manes M.D.   On: 01/25/2014 09:01   Dg C-arm 1-60 Min-no Report  01/20/2014   CLINICAL DATA: surgery   C-ARM 1-60 MINUTES  Fluoroscopy was utilized by the requesting physician.  No radiographic  interpretation.     Microbiology: No results found for this or any previous visit (from the past 240 hour(s)).   Labs: Basic Metabolic Panel:  Recent Labs Lab 01/26/14 1446 02/01/14 0413  NA 131* 134*  K 4.7 3.5*  CL 93* 98  CO2 22 27  GLUCOSE 104* 161*  BUN 10 8  CREATININE 0.88 0.88  CALCIUM 10.9* 8.9  MG  --  1.3*   Liver Function Tests: No results found for this basename: AST, ALT, ALKPHOS, BILITOT, PROT, ALBUMIN,  in the last 168 hours No results found for this basename: LIPASE, AMYLASE,  in the last 168 hours No results found for this basename: AMMONIA,  in the last 168 hours CBC:  Recent Labs Lab 01/26/14 1446 02/01/14 0413  WBC 9.5 13.1*  HGB 13.4 10.4*  HCT 40.6 30.1*  MCV 90.2 85.5  PLT 284 268   Cardiac Enzymes: No results found for this basename: CKTOTAL, CKMB, CKMBINDEX, TROPONINI,  in the last 168 hours BNP: BNP (last 3 results) No results found for this basename: PROBNP,  in the last 8760 hours CBG:  Recent Labs Lab 01/31/14 0759 01/31/14 1158 01/31/14 1658 01/31/14 2141 02/01/14 0733  GLUCAP 134* 169* 126* 129* 129*    Time coordinating discharge: Over 30 minutes

## 2014-02-01 NOTE — Consult Note (Signed)
WOC ostomy follow up Stoma type/location:  MF LLQ, packed old stoma site with moist saline gauze, covered with tape.  New pouch placed 2 1/4", no output from this site.  Suggested stoma cap for long term use instead of pouch.  Stomal assessment/size:  MF: 1 5/8" oval, pink, moist RLQ, colostomy: 1 3/4": round, budded from the skin, pink, moist Peristomal assessment: both sites clean, intact, she has a dip in the skin at the RLQ stoma at the umbilicus Treatment options for stomal/peristomal skin: added 2" skin barrier for the skin crease on the RLQ stoma Output liquid green stool from the functional stoma  Ostomy pouching: 2pc. Used at both stomas, added barrier ring for the functional stoma Education provided:  Demonstrated pouch change to two daughters and 2 granddaughters.  1 daughter and granddaughter are CNAs.  The all observed the pouch changes and asked appropriate questions.   Extra supplies in the room for dc to SNF.  [facility] team will follow along with you for ostomy care and education.  Kensi Karr [place] RN,CWOCN [phone]

## 2014-02-01 NOTE — Discharge Instructions (Signed)

## 2014-02-01 NOTE — Telephone Encounter (Signed)
LEFT MESSAGE FOR PATIENT AND GAVE NEW DATE/TIME FOR HOSP F/U 10/20 @ 11 W/DR. [place]. CONTACT INFORMATION LEFT FOR PATIENT TO RETURN CALL AND CONFIRM NEW APPT D/T.

## 2014-02-01 NOTE — Plan of Care (Signed)
Problem: Phase III Progression Outcomes Goal: Activity at appropriate level-compared to baseline (UP IN CHAIR FOR HEMODIALYSIS)  Outcome: Not Met (add Reason) Patient not getting oob. Takes 2 people to get oob to [facility].

## 2014-02-01 NOTE — Progress Notes (Signed)
Pt for discharge to [facility].  CSW facilitated pt discharge needs including confirming that [facility] received [employer] authorization, faxed pt discharge summary via [facility], discussed with pt and pt daughter at bedside, provided RN phone number to call report, and scheduled ambulance transport for pt to [facility] scheduled for 3:00 pm via [facility].   Pt family appreciative of CSW support and assistance with rehab placement.   No further socail work needs identified at this time.   CSW signing off.   Alison Murray, MSW, [place] Work [phone]

## 2014-02-01 NOTE — Progress Notes (Signed)
Called report to [facility] and spoke with [place]. Patient stable from AM assessment. Will transport to facility via [facility].

## 2014-02-01 NOTE — Plan of Care (Signed)
Problem: Phase III Progression Outcomes Goal: Voiding independently Outcome: Not Met (add Reason) Patient had not voided this shift by 0210. Have attempted to use bedpan without success except for a few drops of urine. Bladder scan done showing 468 ml of urine in bladder. Encouraged patient to get oob to [facility]. Assisted oob by RN and NT. Patient started voiding when she stood up by bed onto the floor a "good amount". Lower abdomen pain felt "better" once voided.

## 2014-02-01 NOTE — Progress Notes (Signed)
Patient's pulse felt irregular so checked an EKG. It showed ST with frequent/consecutive PVC's. Incomplete RBBB. ST & T wave abnormality. Patient without complaints of chest pains. No dizziness. Paged NP on call. Orders received for am lab work. Patient remains confused. Last Calcium level 10.9 on 9/16.

## 2014-02-01 NOTE — Progress Notes (Signed)
Paged NP Schorr about K+ and Mg+ levels this am. Orders received.

## 2014-02-02 ENCOUNTER — Ambulatory Visit: Payer: Medicare HMO

## 2014-02-02 ENCOUNTER — Telehealth: Payer: Self-pay | Admitting: Hematology and Oncology

## 2014-02-02 ENCOUNTER — Inpatient Hospital Stay: Payer: Medicare HMO | Admitting: Hematology and Oncology

## 2014-02-02 NOTE — Telephone Encounter (Signed)
LEFT MESSAGE FOR PATIENT DTR GIVING NEW DTAE/TIME FOR HOSP F/U FOR 10/20 @ 11 W/DR. [place]. CONTACT INFORMATION WAS LEFT FOR PATIENT DTR TO RETURN CALL TO CONFIRM MESSAGE WAS RECEIVED.

## 2014-03-01 ENCOUNTER — Ambulatory Visit: Payer: Medicare HMO

## 2014-03-01 ENCOUNTER — Inpatient Hospital Stay: Payer: Medicare HMO | Admitting: Hematology and Oncology

## 2014-03-10 ENCOUNTER — Telehealth: Payer: Self-pay | Admitting: *Deleted

## 2014-03-10 ENCOUNTER — Telehealth: Payer: Self-pay | Admitting: Hematology and Oncology

## 2014-03-10 NOTE — Telephone Encounter (Signed)
Informed daughter of appt tomorrow at 10:30 am and instructed to please show up early.  She verbalized understanding.  Request sent to scheduler.

## 2014-03-10 NOTE — Telephone Encounter (Signed)
added pt appts...done...per pof pt aware

## 2014-03-10 NOTE — Telephone Encounter (Signed)
Tomorrow 1030 to 11, must show up early

## 2014-03-10 NOTE — Telephone Encounter (Signed)
Daughter asking for f/u appt w/ Dr. [name] for pt..  Informed her of missed visit on 10/20.  Daughter states she was never informed of that appt.Karen Arias

## 2014-03-11 ENCOUNTER — Encounter: Payer: Self-pay | Admitting: Hematology and Oncology

## 2014-03-11 ENCOUNTER — Ambulatory Visit (HOSPITAL_BASED_OUTPATIENT_CLINIC_OR_DEPARTMENT_OTHER): Payer: Medicare HMO | Admitting: Hematology and Oncology

## 2014-03-11 VITALS — BP 104/46 | HR 59 | Temp 97.7°F | Resp 17 | Ht 62.0 in | Wt 104.8 lb

## 2014-03-11 DIAGNOSIS — Q429 Congenital absence, atresia and stenosis of large intestine, part unspecified: Secondary | ICD-10-CM

## 2014-03-11 DIAGNOSIS — R630 Anorexia: Secondary | ICD-10-CM | POA: Insufficient documentation

## 2014-03-11 DIAGNOSIS — C252 Malignant neoplasm of tail of pancreas: Secondary | ICD-10-CM

## 2014-03-11 DIAGNOSIS — K56609 Unspecified intestinal obstruction, unspecified as to partial versus complete obstruction: Secondary | ICD-10-CM

## 2014-03-11 HISTORY — DX: Anorexia: R63.0

## 2014-03-11 MED ORDER — PREDNISONE 5 MG PO TABS: 5.0000 mg | ORAL_TABLET | Freq: Two times a day (BID) | ORAL | Status: DC

## 2014-03-11 NOTE — Assessment & Plan Note (Signed)
She requested low-dose prednisone and I gave her prescription 5 mg twice a day to be taken with food to see if this would stimulate some appetite.

## 2014-03-11 NOTE — Assessment & Plan Note (Signed)
Clinically, she has unresectable, locally advanced pancreatic cancer. She had divergentsurgery and has mildly improved since her recent hospitalization. She has lost a lot of weight and continue to have poor baseline performance status with anorexia and progressive weight loss. I discussed with the patient about palliative care and hospice. With her age, general decline and progressive weight loss, I do not feel that she would be a candidate for systemic chemotherapy. At best, I can offer her single agent gemcitabine. The risks, benefits, side effects of gemcitabine is discussed with her and her daughter. Patient education handout was given. The patient will go home and think about it. Her daughter recommend hospice and I agree with the patient once to go home and think about it further.

## 2014-03-11 NOTE — Progress Notes (Signed)
[place] OFFICE PROGRESS NOTE  Patient Care Team: Lorene Dy, MD as PCP - General (Internal Medicine)  SUMMARY OF ONCOLOGIC HISTORY: Karen Arias was recently hospitalized in September 2015 for abdominal pain. I was consulted. This patient follows with Dr. [name] last year for stomach reflux. She had CT imaging study done last year in April 2014 which show thickening of the stomach and subtle change in the pancreas. Apparently, she had both upper and lower endoscopy done at that time. I could only find the report the upper endoscopy which showed a subtle gastropathy. Over the last 3 months, she had progressive left upper quadrant pain, loss of appetite, 10 pound weight loss and progressive constipation. Prior to admission, she was constipated for 7 days. On 01/14/2014, CT scan showed left upper quadrant soft tissue mass causing obstruction of the colon at the level of the splenic flexure. The mass measures approximately 5.7 cm in greatest dimensions. Exact origin of the  tumor is not entirely clear by CT. 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 Second most likely origin would be the colon. No associated colonic perforation is identified by CT. on 01/20/2014, she had placement of Port-A-Cath and diversion colostomy for palliation of obstruction.  Since dismissal from the hospital, she had progressive anorexia, weight loss and general decline. She denies pain or nausea. Her colostomy and urostomy bags are functioning well.  REVIEW OF SYSTEMS:   Eyes: Denies blurriness of vision Ears, nose, mouth, throat, and face: Denies mucositis or sore throat Respiratory: Denies cough, dyspnea or wheezes Cardiovascular: Denies palpitation, chest discomfort or lower extremity swelling Gastrointestinal:  Denies nausea, heartburn or change in bowel habits Skin: Denies abnormal skin rashes Lymphatics: Denies new lymphadenopathy or easy  bruising Neurological:Denies numbness, tingling or new weaknesses Behavioral/Psych: Mood is stable, no new changes  All other systems were reviewed with the patient and are negative.  I have reviewed the past medical history, past surgical history, social history and family history with the patient and they are unchanged from previous note.  ALLERGIES:  has No Known Allergies.  MEDICATIONS:  Current Outpatient Prescriptions  Medication Sig Dispense Refill  . ALPRAZolam (XANAX) 0.5 MG tablet Take 2 tablets (1 mg total) by mouth at bedtime. anxiety  30 tablet  0  . oxyCODONE-acetaminophen (PERCOCET/ROXICET) 5-325 MG per tablet Take 1-2 tablets by mouth every 4 (four) hours as needed for moderate pain.  30 tablet  0  . promethazine (PHENERGAN) 12.5 MG tablet Take 1-2 tablets (12.5-25 mg total) by mouth every 6 (six) hours as needed for nausea.  30 tablet  0  . predniSONE (DELTASONE) 5 MG tablet Take 1 tablet (5 mg total) by mouth 2 (two) times daily with a meal.  60 tablet  0   No current facility-administered medications for this visit.    PHYSICAL EXAMINATION: ECOG PERFORMANCE STATUS: 2 - Symptomatic, <50% confined to bed  Filed Vitals:   03/11/14 1021  BP: 104/46  Pulse: 59  Temp: 97.7 F (36.5 C)  Resp: 17   Filed Weights   03/11/14 1021  Weight: 104 lb 12.8 oz (47.537 kg)    GENERAL:alert, no distress and comfortable. She appears thin and cachectic SKIN: skin color, texture, turgor are normal, no rashes or significant lesions EYES: normal, Conjunctiva are pink and non-injected, sclera clear OROPHARYNX:no exudate, no erythema and lips, buccal mucosa, and tongue normal  NECK: supple, thyroid normal size, non-tender, without nodularity LYMPH:  no palpable  lymphadenopathy in the cervical, axillary or inguinal LUNGS: clear to auscultation and percussion with normal breathing effort HEART: regular rate & rhythm and no murmurs and no lower extremity edema ABDOMEN:abdomen soft,  non-tender and normal bowel sounds. Colostomy and urostomy bags in situ Musculoskeletal:no cyanosis of digits and no clubbing  NEURO: alert & oriented x 3 with fluent speech, no focal motor/sensory deficits  LABORATORY DATA:  I have reviewed the data as listed    Component Value Date/Time   NA 134* 02/01/2014 0413   K 3.5* 02/01/2014 0413   CL 98 02/01/2014 0413   CO2 27 02/01/2014 0413   GLUCOSE 161* 02/01/2014 0413   BUN 8 02/01/2014 0413   CREATININE 0.88 02/01/2014 0413   CALCIUM 8.9 02/01/2014 0413   PROT 8.1 01/14/2014 1402   ALBUMIN 3.9 01/14/2014 1402   AST 23 01/14/2014 1402   ALT 13 01/14/2014 1402   ALKPHOS 56 01/14/2014 1402   BILITOT 0.4 01/14/2014 1402   GFRNONAA 59* 02/01/2014 0413   GFRAA 69* 02/01/2014 0413    No results found for this basename: SPEP, UPEP,  kappa and lambda light chains    Lab Results  Component Value Date   WBC 13.1* 02/01/2014   NEUTROABS 4.5 01/14/2014   HGB 10.4* 02/01/2014   HCT 30.1* 02/01/2014   MCV 85.5 02/01/2014   PLT 268 02/01/2014      Chemistry      Component Value Date/Time   NA 134* 02/01/2014 0413   K 3.5* 02/01/2014 0413   CL 98 02/01/2014 0413   CO2 27 02/01/2014 0413   BUN 8 02/01/2014 0413   CREATININE 0.88 02/01/2014 0413      Component Value Date/Time   CALCIUM 8.9 02/01/2014 0413   ALKPHOS 56 01/14/2014 1402   AST 23 01/14/2014 1402   ALT 13 01/14/2014 1402   BILITOT 0.4 01/14/2014 1402      ASSESSMENT & PLAN:  Colonic obstruction secondary to pancreatic mass status post diverting colostomy Clinically, she has unresectable, locally advanced pancreatic cancer. She had divergentsurgery and has mildly improved since her recent hospitalization. She has lost a lot of weight and continue to have poor baseline performance status with anorexia and progressive weight loss. I discussed with the patient about palliative care and hospice. With her age, general decline and progressive weight loss, I do not feel that she would be a candidate for systemic  chemotherapy. At best, I can offer her single agent gemcitabine. The risks, benefits, side effects of gemcitabine is discussed with her and her daughter. Patient education handout was given. The patient will go home and think about it. Her daughter recommend hospice and I agree with the patient once to go home and think about it further.   Anorexia She requested low-dose prednisone and I gave her prescription 5 mg twice a day to be taken with food to see if this would stimulate some appetite.   No orders of the defined types were placed in this encounter.   All questions were answered. The patient knows to call the clinic with any problems, questions or concerns. No barriers to learning was detected. I spent 30 minutes counseling the patient face to face. The total time spent in the appointment was 40 minutes and more than 50% was on counseling and review of test results     [facility], [place], MD 03/11/2014 11:06 AM

## 2014-03-14 ENCOUNTER — Telehealth: Payer: Self-pay | Admitting: *Deleted

## 2014-03-14 NOTE — Telephone Encounter (Signed)
Daughter called and requests referral to [facility] as discussed w/ Dr. [name] on last visit.  She says pt has agreed to the referral.  Called [facility] and s/w Yvette for referral.  They will contact daughter to arrange admission visit.

## 2014-03-16 ENCOUNTER — Other Ambulatory Visit: Payer: Self-pay | Admitting: Hematology and Oncology

## 2014-03-16 ENCOUNTER — Telehealth: Payer: Self-pay | Admitting: *Deleted

## 2014-03-16 DIAGNOSIS — K56609 Unspecified intestinal obstruction, unspecified as to partial versus complete obstruction: Secondary | ICD-10-CM

## 2014-03-16 MED ORDER — OXYCODONE-ACETAMINOPHEN 5-325 MG PO TABS: 1.0000 | ORAL_TABLET | ORAL | Status: DC | PRN

## 2014-03-16 NOTE — Telephone Encounter (Signed)
Call from Hospice RN requests [facility] [address]  [facility] [address] fax [phone].

## 2014-03-22 ENCOUNTER — Telehealth: Payer: Self-pay | Admitting: *Deleted

## 2014-03-22 NOTE — Telephone Encounter (Signed)
Call to report size of her ostomy stoma has gone from 35 mm to 51 mm in 24 hours. Still having stool output, no bleeding. Does not appear to have a hernia. Is having some bilateral flank pain also. Also having some nausea and has vomited up her Phenergan. V/S 98.0-80-110/70 and sat 98%. Please advise.

## 2014-03-22 NOTE — Telephone Encounter (Signed)
Per Dr. [name] : Have Hospice physician provided symptom management. Call surgeon's office regarding stoma issue (Dr. Ralene Ok). Lattie Haw, RN notified.

## 2014-03-23 ENCOUNTER — Encounter (HOSPITAL_COMMUNITY): Payer: Self-pay | Admitting: Emergency Medicine

## 2014-03-23 ENCOUNTER — Emergency Department (HOSPITAL_COMMUNITY): Payer: Medicare HMO

## 2014-03-23 ENCOUNTER — Emergency Department (HOSPITAL_COMMUNITY)
Admission: EM | Admit: 2014-03-23 | Discharge: 2014-03-23 | Disposition: A | Discharge status: Home / Self Care | Payer: Medicare HMO | Attending: Emergency Medicine | Admitting: Emergency Medicine

## 2014-03-23 DIAGNOSIS — K9409 Other complications of colostomy: Secondary | ICD-10-CM | POA: Insufficient documentation

## 2014-03-23 DIAGNOSIS — F419 Anxiety disorder, unspecified: Secondary | ICD-10-CM | POA: Insufficient documentation

## 2014-03-23 DIAGNOSIS — Z9889 Other specified postprocedural states: Secondary | ICD-10-CM | POA: Insufficient documentation

## 2014-03-23 DIAGNOSIS — Z79899 Other long term (current) drug therapy: Secondary | ICD-10-CM | POA: Insufficient documentation

## 2014-03-23 DIAGNOSIS — R630 Anorexia: Secondary | ICD-10-CM | POA: Diagnosis not present

## 2014-03-23 DIAGNOSIS — I252 Old myocardial infarction: Secondary | ICD-10-CM | POA: Diagnosis not present

## 2014-03-23 DIAGNOSIS — E119 Type 2 diabetes mellitus without complications: Secondary | ICD-10-CM | POA: Diagnosis not present

## 2014-03-23 DIAGNOSIS — Z7952 Long term (current) use of systemic steroids: Secondary | ICD-10-CM | POA: Insufficient documentation

## 2014-03-23 DIAGNOSIS — R112 Nausea with vomiting, unspecified: Secondary | ICD-10-CM | POA: Insufficient documentation

## 2014-03-23 DIAGNOSIS — K9400 Colostomy complication, unspecified: Secondary | ICD-10-CM

## 2014-03-23 DIAGNOSIS — Z8619 Personal history of other infectious and parasitic diseases: Secondary | ICD-10-CM | POA: Insufficient documentation

## 2014-03-23 DIAGNOSIS — R1909 Other intra-abdominal and pelvic swelling, mass and lump: Secondary | ICD-10-CM | POA: Insufficient documentation

## 2014-03-23 DIAGNOSIS — I10 Essential (primary) hypertension: Secondary | ICD-10-CM | POA: Diagnosis not present

## 2014-03-23 MED ORDER — POLYETHYLENE GLYCOL 3350 17 G PO PACK: 17.0000 g | PACK | Freq: Every day | ORAL | Status: AC

## 2014-03-23 NOTE — ED Provider Notes (Signed)
CSN: [id number]     Arrival date & time 03/23/14  1602 History   First MD Initiated Contact with Patient 03/23/14 1631     Chief Complaint  Patient presents with  . Colostomy stoma swelling out      (Consider location/radiation/quality/duration/timing/severity/associated sxs/prior Treatment) The history is provided by the patient and a relative.  patient presents with some abdominal swelling and decreased colostomy output. 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 No fevers. She has had nausea and vomiting. Her colostomy has been more protuberant. She is still had gas coming out but no stool for the last 2 days. No fevers.she is on hospice for pancreatic cancer.  Past Medical History  Diagnosis Date  . Hypertension   . Diabetes mellitus   . Hypercholesteremia   . Anxiety   . MI (myocardial infarction)   . H. pylori infection   . Gastritis   . Anorexia 03/11/2014   Past Surgical History  Procedure Laterality Date  . Cesarean section    . Upper gastrointestinal endoscopy  09/2012    Dr. [name]  . Flexible sigmoidoscopy N/A 01/18/2014    Procedure: FLEXIBLE SIGMOIDOSCOPY;  Surgeon: Irene Shipper, MD;  Location: [facility];  Service: Endoscopy;  Laterality: N/A;  . Colostomy takedown N/A 01/20/2014    Procedure: LAPAROSCOPIC COLOSTOMY ;  Surgeon: Excell Seltzer, MD;  Location: [facility];  Service: General;  Laterality: N/A;  . Portacath placement N/A 01/20/2014    Procedure: INSERTION PORT-A-CATH RIGHT INTERNAL JUGULAR;  Surgeon: Excell Seltzer, MD;  Location: [facility];  Service: General;  Laterality: N/A;  . Laparotomy N/A 01/26/2014    Procedure: EXPLORATORY LAPAROTOMY, COLOSTOMY, EXCISION OF MUCUS FISTULA AND END COLOSTOMY;  Surgeon: Ralene Ok, MD;  Location: [facility];  Service: General;  Laterality: N/A;   Family History  Problem Relation Age of Onset  . Diabetes Father   . Hypertension Father   . Heart failure Father      History  Substance Use Topics  . Smoking status: Never Smoker   . Smokeless tobacco: Never Used  . Alcohol Use: No   OB History    No data available     Review of Systems  Constitutional: Positive for appetite change. Negative for activity change.  Eyes: Negative for pain.  Respiratory: Negative for chest tightness and shortness of breath.   Cardiovascular: Negative for chest pain and leg swelling.  Gastrointestinal: Positive for nausea, vomiting and abdominal pain. Negative for diarrhea.  Genitourinary: Negative for flank pain.  Musculoskeletal: Negative for back pain and neck stiffness.  Skin: Negative for rash.  Neurological: Negative for weakness, numbness and headaches.  Psychiatric/Behavioral: Negative for behavioral problems.      Allergies  Review of patient's allergies indicates no known allergies.  Home Medications   Prior to Admission medications   Medication Sig Start Date End Date Taking? Authorizing Provider  ALPRAZolam Duanne Moron) 0.5 MG tablet Take 2 tablets (1 mg total) by mouth at bedtime. anxiety 02/01/14  Yes Robbie Lis, MD  haloperidol (HALDOL) 2 MG/ML solution Take 6 mg by mouth every 4 (four) hours as needed for agitation (nausea & vomiting).   Yes Historical Provider, MD  metFORMIN (GLUCOPHAGE) 500 MG tablet Take 500 mg by mouth 2 (two) times daily with a meal.   Yes Historical Provider, MD  oxyCODONE-acetaminophen (PERCOCET/ROXICET) 5-325 MG per tablet Take 1 tablet by mouth every 4 (four) hours as needed for moderate pain. 03/16/14  Yes Heath Lark, MD  predniSONE (DELTASONE) 5 MG tablet Take 1 tablet (5 mg total) by mouth 2 (two) times daily with a meal. 03/11/14  Yes Heath Lark, MD  promethazine (PHENERGAN) 12.5 MG tablet Take 1-2 tablets (12.5-25 mg total) by mouth every 6 (six) hours as needed for nausea. 02/01/14  Yes Robbie Lis, MD  polyethylene glycol [facility] / Floria Raveling) packet Take 17 g by mouth daily. 03/23/14   Jasper Riling. Emelina Hinch, MD    BP 124/60 mmHg  Pulse 76  Temp(Src) 98.2 F (36.8 C) (Oral)  Resp 18  SpO2 100% Physical Exam  Constitutional: She is oriented to person, place, and time. She appears well-developed and well-nourished.  HENT:  Head: Normocephalic and atraumatic.  Eyes: EOM are normal. Pupils are equal, round, and reactive to light.  Neck: Normal range of motion. Neck supple.  Cardiovascular: Normal rate, regular rhythm and normal heart sounds.   No murmur heard. Pulmonary/Chest: Effort normal and breath sounds normal. No respiratory distress. She has no wheezes. She has no rales.  Abdominal: Soft. She exhibits no distension. There is tenderness. There is no rebound and no guarding.  Pink stoma in right lower quadrant. Some edema. Picture attached. Able to probe with finger.mucous fistula to left lower quadrant.  Musculoskeletal: Normal range of motion.  Neurological: She is alert and oriented to person, place, and time. No cranial nerve deficit.  Skin: Skin is warm and dry.  Psychiatric: She has a normal mood and affect. Her speech is normal.  Nursing note and vitals reviewed.   ED Course  Procedures (including critical care time) Labs Review Labs Reviewed - No data to display  Imaging Review Dg Abd 2 Views  03/23/2014   CLINICAL DATA:  Recent colostomy placement for colon carcinoma, vomiting  EXAM: ABDOMEN - 2 VIEW  COMPARISON:  01/26/1947.  FINDINGS: Scattered large and small bowel gas is noted.  Two  Ostomies are noted bilaterally the right-sided ostomy is new from the prior exam. A few air-fluid levels are seen. No free air is noted. No obstructive changes are noted. Stable is noted overlying the pelvis in the midline, question extrinsic artifact.  IMPRESSION: No definitive obstructive changes noted. Changes of prior ostomy is are seen.   Electronically Signed   By: Inez Catalina M.D.   On: 03/23/2014 18:36     EKG Interpretation None      MDM   Final diagnoses:  Colostomy  complication    Patient with a somewhat prolapsed colostomy. Patient does not want any more surgery and would not be a good candidate. She is still passing gas. Discussed with Dr. Donne Hazel. We'll give some mural accident discharge home. X-ray does not show obstruction.      Jasper Riling. Alvino Chapel, MD 03/23/14 2352

## 2014-03-23 NOTE — ED Notes (Signed)
Pt had colostomy placed on 09/10.  States that Sunday her stoma was 35 mm and when she woke up on Monday it had grown to 51 mm.  Has been vomiting yesterday.  Dr. Rush Farmer sent pt here for eval.

## 2014-03-23 NOTE — ED Notes (Signed)
Bed: MW41 Expected date:  Expected time:  Means of arrival:  Comments: Pt from [facility]

## 2014-04-13 ENCOUNTER — Other Ambulatory Visit: Payer: Self-pay | Admitting: *Deleted

## 2014-04-13 MED ORDER — OXYCODONE-ACETAMINOPHEN 5-325 MG PO TABS: 1.0000 | ORAL_TABLET | ORAL | Status: DC | PRN

## 2014-04-13 NOTE — Telephone Encounter (Signed)
Hospice RN, Onyeje, state pt need [facility] [address]  Informed her that Dr. [name] would prefer Hospice MD to provide symptom management.  RN states she will ask Hospice MD for future refills but pt is out of medication and asks if Dr. [name] will fill it this time.  Dr. [name] agreed and [facility].

## 2014-05-03 ENCOUNTER — Other Ambulatory Visit: Payer: Self-pay | Admitting: *Deleted

## 2014-05-03 ENCOUNTER — Telehealth: Payer: Self-pay

## 2014-05-03 MED ORDER — OXYCODONE-ACETAMINOPHEN 5-325 MG PO TABS: 1.0000 | ORAL_TABLET | ORAL | Status: AC | PRN

## 2014-05-03 MED ORDER — ALPRAZOLAM 0.5 MG PO TABS: 1.0000 mg | ORAL_TABLET | Freq: Every day | ORAL | Status: AC

## 2014-05-03 NOTE — Telephone Encounter (Signed)
Prescriptions faxed to hospice.

## 2014-05-03 NOTE — Telephone Encounter (Signed)
[facility]

## 2014-05-11 ENCOUNTER — Other Ambulatory Visit: Payer: Self-pay | Admitting: *Deleted

## 2014-05-11 DIAGNOSIS — R630 Anorexia: Secondary | ICD-10-CM

## 2014-05-11 MED ORDER — PREDNISONE 5 MG PO TABS: 5.0000 mg | ORAL_TABLET | Freq: Two times a day (BID) | ORAL | Status: AC

## 2014-06-02 ENCOUNTER — Telehealth: Payer: Self-pay | Admitting: *Deleted

## 2014-06-02 MED ORDER — AMLODIPINE BESYLATE 2.5 MG PO TABS: 2.5000 mg | ORAL_TABLET | Freq: Every day | ORAL | Status: AC

## 2014-06-02 NOTE — Telephone Encounter (Signed)
pls call in 2.5 mg amlodipine daily, 30 tabs 3 refills

## 2014-06-02 NOTE — Telephone Encounter (Signed)
Informed Hospice RN and pt's dtr, Pam, of new Rx for Amlodipine 2.[facility] mg [facility] [address].

## 2014-06-02 NOTE — Telephone Encounter (Signed)
Received call from [place], [place] @ [facility] wanting to inform Dr. [name] of pt's increased BP.  Spoke with Onyeje, and was informed that pt's BP  2 weeks ago  110/60 ;  Last week   143/93  ;  Yesterday  173/82 .   Pt was on antihypertensives in the past but was discontinued.   Onyeje stated hospice mds will manage symptoms related to hospice issues, but not all general symptoms.  Message sent to Dr. [name], and Jill Alexanders, desk nurse for review.   [facility] [address] Onyeje's  Phone     [phone].

## 2014-06-30 ENCOUNTER — Telehealth: Payer: Self-pay

## 2014-06-30 NOTE — Telephone Encounter (Signed)
onajay with hospice called requesting an order for transfer to [facility] for 5 night respite. S/w Dr [name] and she gave verbal order. Called onajay back with information.

## 2014-07-06 ENCOUNTER — Telehealth: Payer: Self-pay | Admitting: Hematology and Oncology

## 2014-07-06 NOTE — Telephone Encounter (Signed)
Received death certificate 9/31/12

## 2014-07-12 DEATH — deceased

## 2014-10-03 ENCOUNTER — Encounter: Payer: Self-pay | Admitting: Hematology and Oncology

## 2014-10-03 NOTE — Telephone Encounter (Signed)
Spoke with daughter

## 2015-11-09 IMAGING — CR DG ABDOMEN ACUTE W/ 1V CHEST
3 series · 3 of 3 positions shown · non-contrast
Comparison: 12/17/2011 similar exam

CLINICAL DATA: Abdominal pain, nausea and vomiting

EXAM:
ACUTE ABDOMEN SERIES (ABDOMEN 2 VIEW & CHEST 1 VIEW)

[w chest pa]
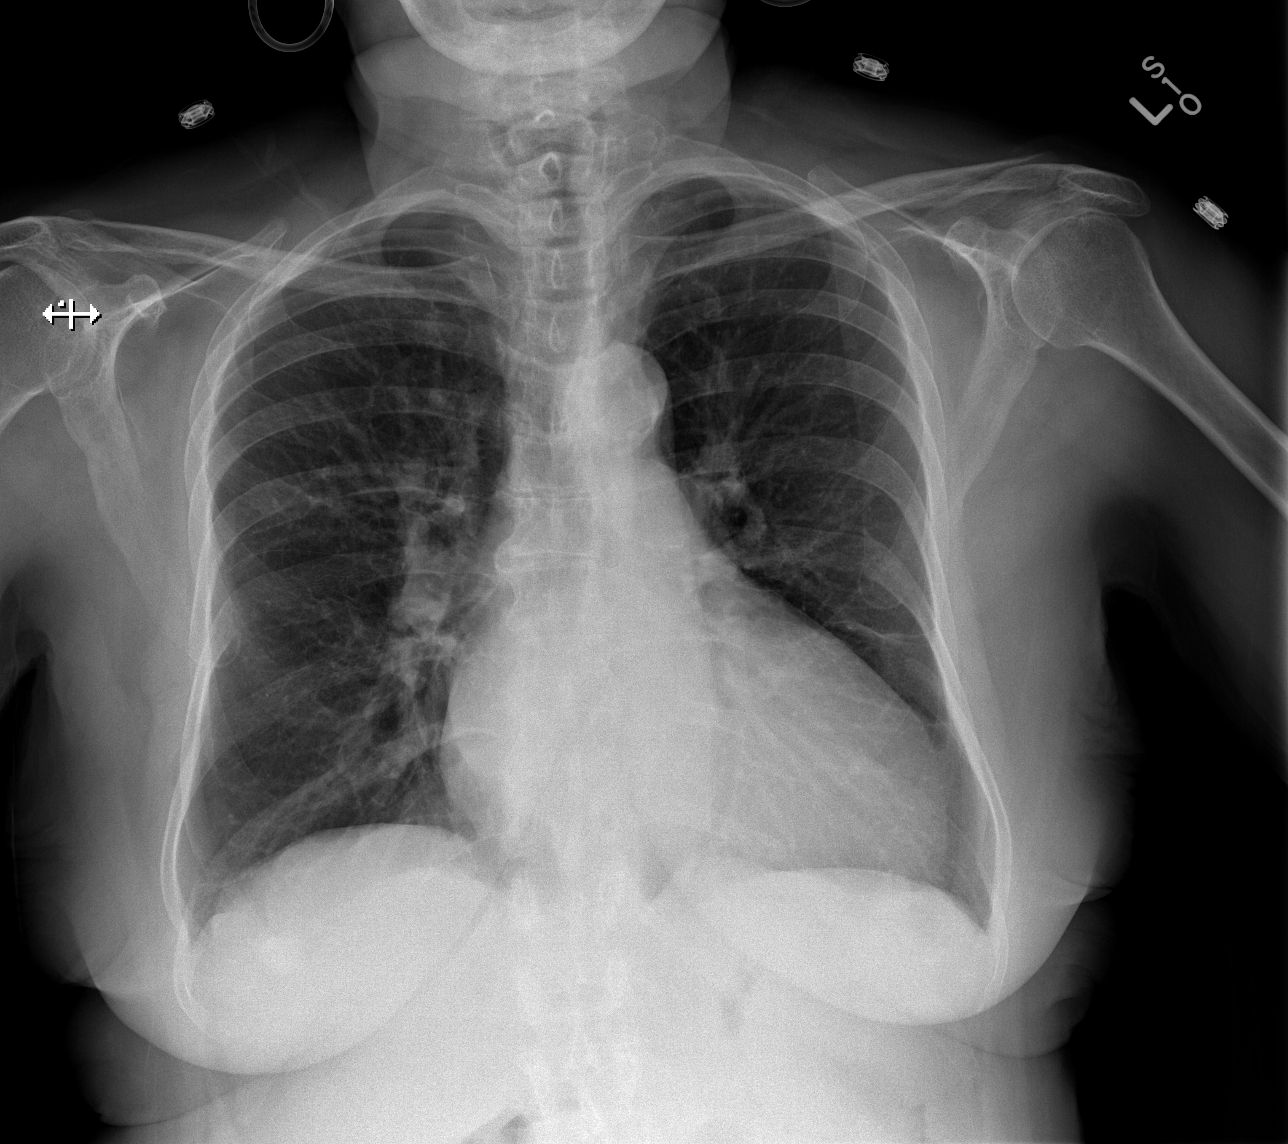

[w abdomen upright]
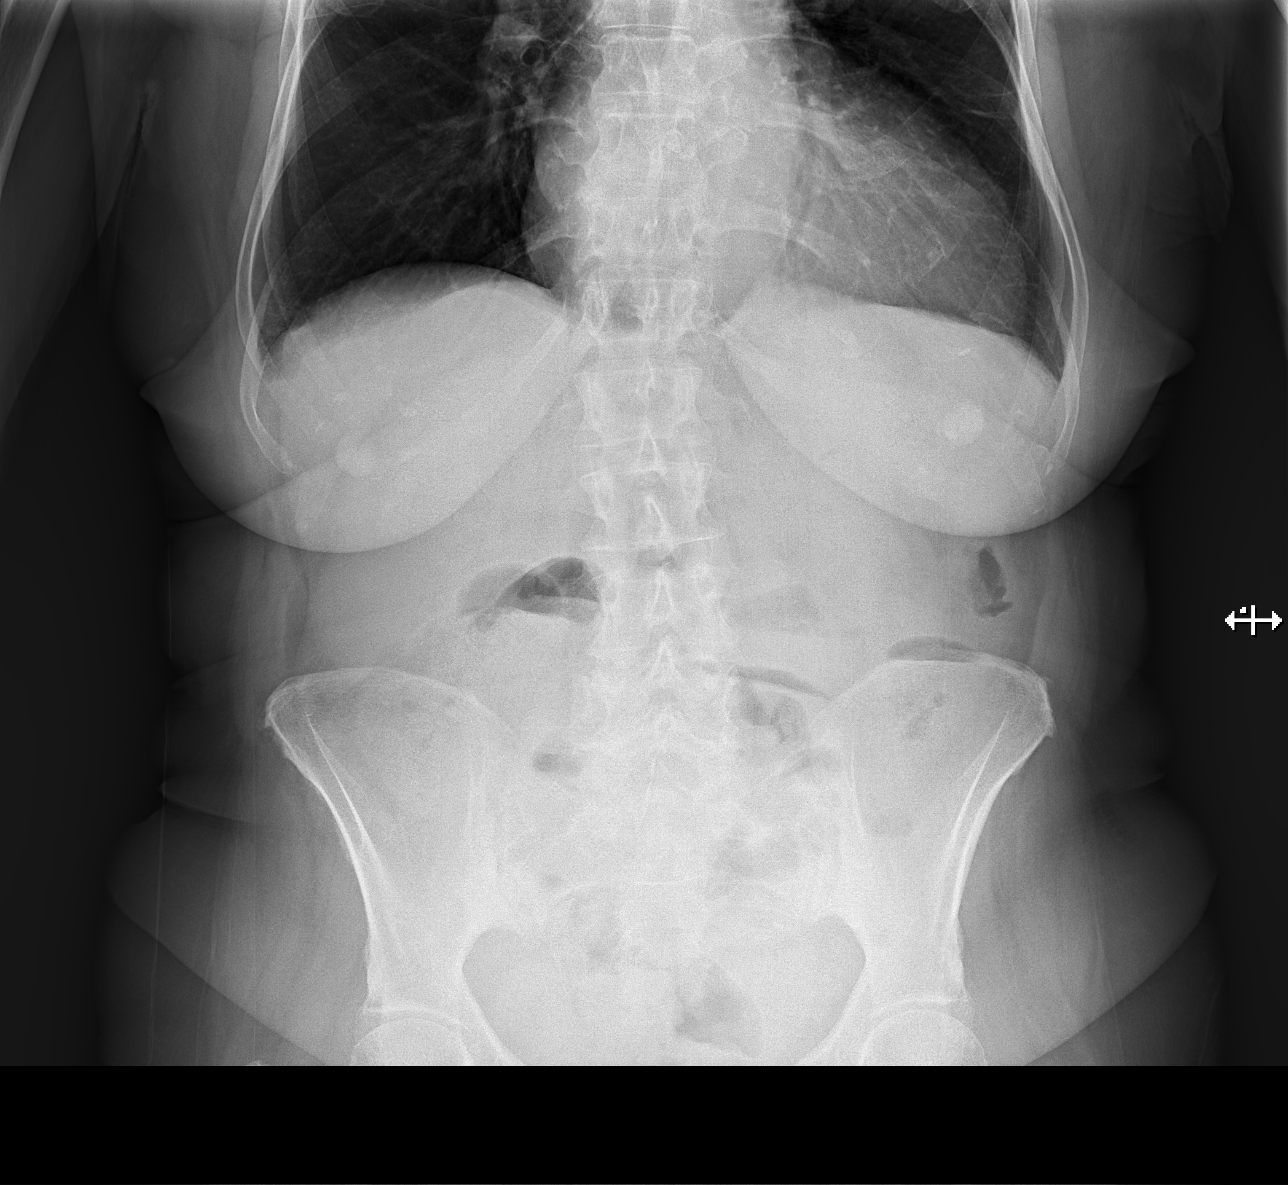

[t abdomen supine]
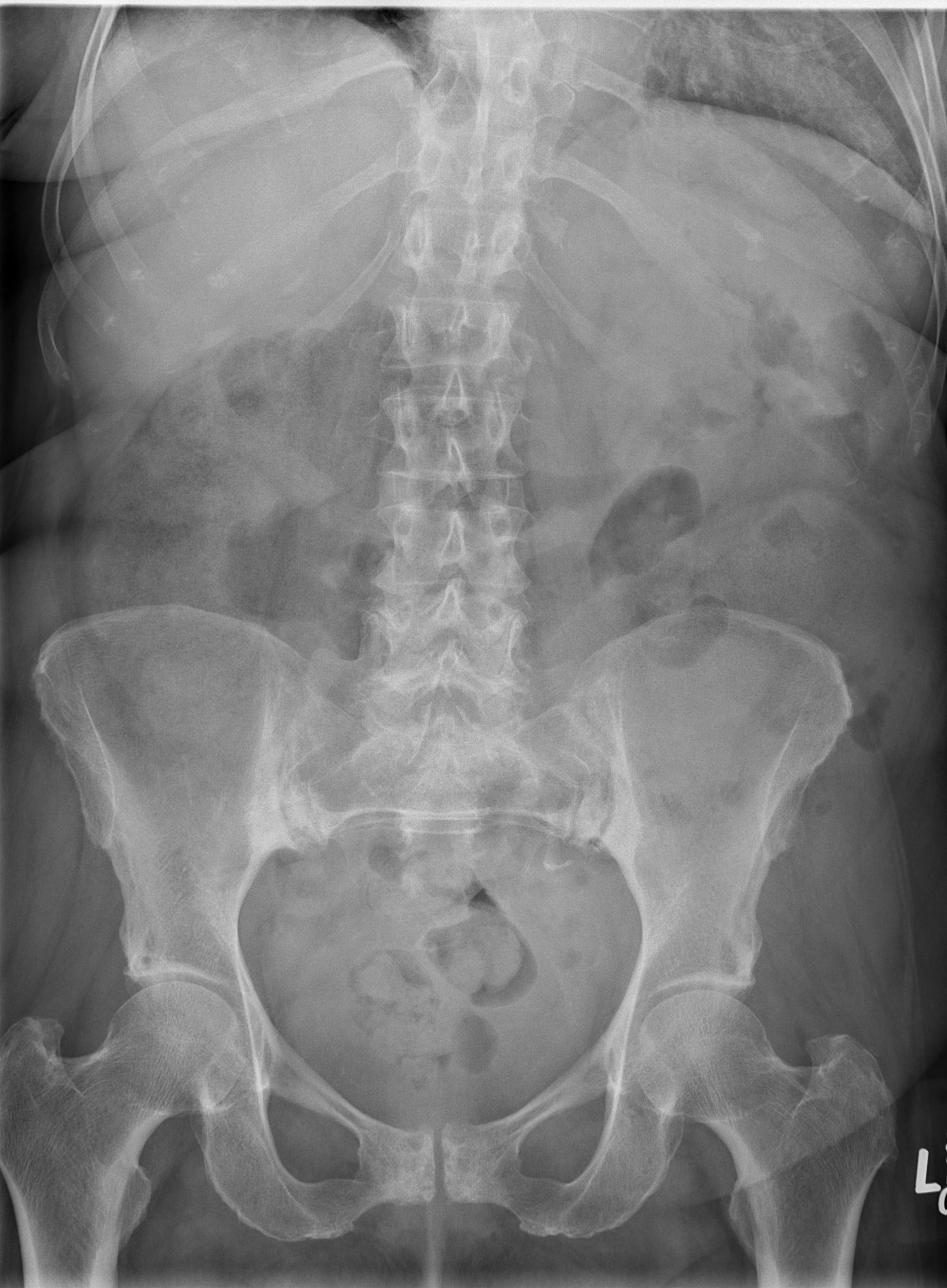

[3 of 3 positions shown; findings below may reference images not displayed]

FINDINGS: Moderate enlargement of the cardiac silhouette is reidentified. The
lungs are clear. No pleural effusion. Left lower lobe scarring is
stable. No free air beneath the diaphragms.

Normal bowel gas pattern. No abnormal radiopacity. No acute osseous
finding. Degenerative changes are noted at the symphysis pubis.
IMPRESSION: Negative abdominal radiographs.  No acute cardiopulmonary disease.

## 2015-11-10 IMAGING — CR DG ABDOMEN 2V
2 series · 2 of 2 positions shown · non-contrast
Comparison: CT abdomen pelvis dated 01/14/2014

CLINICAL DATA: Abdominal pain

EXAM:
ABDOMEN - 2 VIEW

[w abdomen upright *]
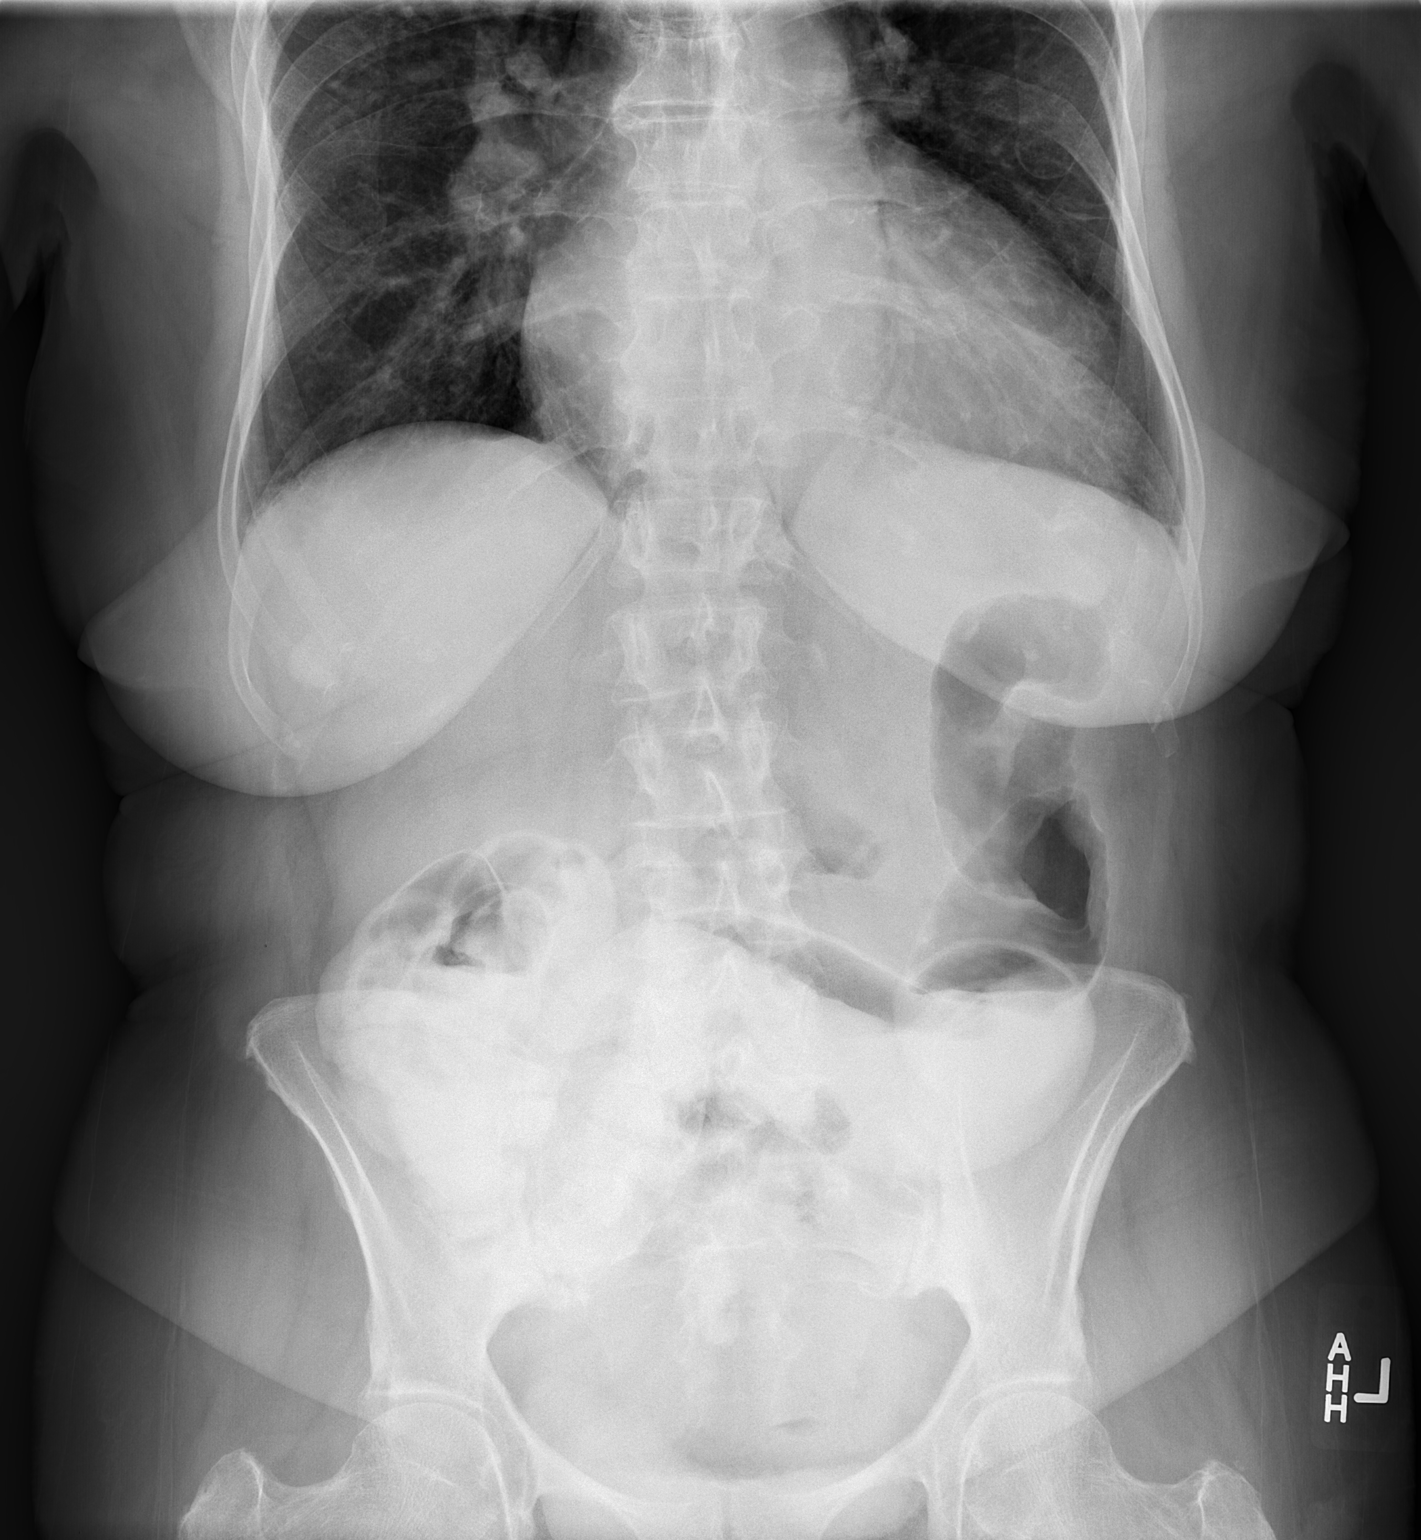

[t abdomen supine]
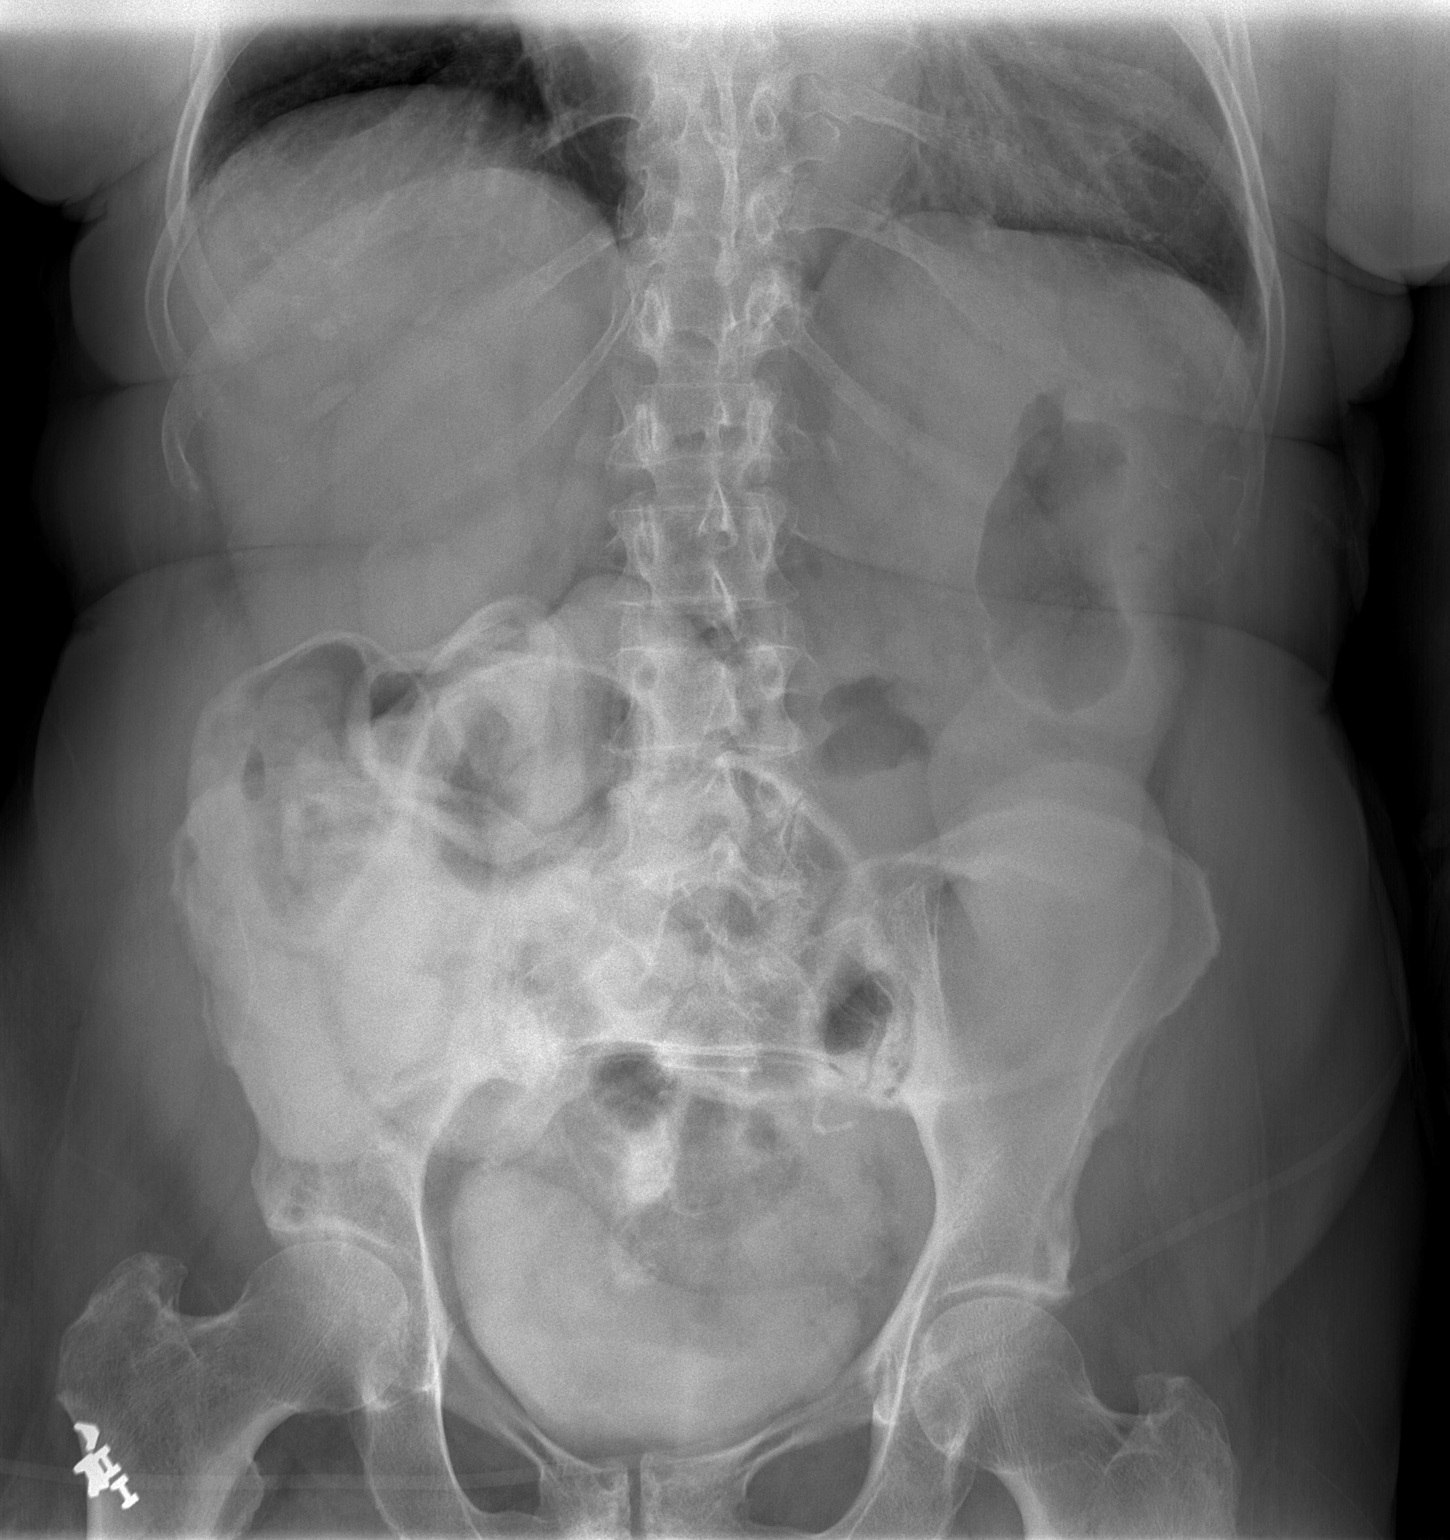

[2 of 2 positions shown; findings below may reference images not displayed]

FINDINGS: Nonspecific but nonobstructive bowel gas pattern, without
disproportionate small bowel dilatation to suggest small bowel
obstruction.

Residual contrast within mildly prominent but nondilated loops of
colon.

No evidence of free air under the diaphragm on the upright view.

Visualized osseous structures are within normal limits.
IMPRESSION: No evidence of small bowel obstruction or free air.
